# Patient Record
Sex: Male | Born: 1980 | Race: White | Hispanic: No | Marital: Single | Smoking: Current every day smoker
Health system: Southern US, Community
[De-identification: ages and names within clinical notes are randomized; demographics above are authoritative.]

## PROBLEM LIST (undated history)

## (undated) DIAGNOSIS — Z8614 Personal history of Methicillin resistant Staphylococcus aureus infection: Secondary | ICD-10-CM

## (undated) DIAGNOSIS — F419 Anxiety disorder, unspecified: Secondary | ICD-10-CM

## (undated) DIAGNOSIS — E785 Hyperlipidemia, unspecified: Secondary | ICD-10-CM

## (undated) DIAGNOSIS — R7303 Prediabetes: Secondary | ICD-10-CM

## (undated) DIAGNOSIS — F319 Bipolar disorder, unspecified: Secondary | ICD-10-CM

## (undated) DIAGNOSIS — M79642 Pain in left hand: Secondary | ICD-10-CM

## (undated) DIAGNOSIS — M869 Osteomyelitis, unspecified: Secondary | ICD-10-CM

## (undated) DIAGNOSIS — F32A Depression, unspecified: Secondary | ICD-10-CM

## (undated) DIAGNOSIS — Z87898 Personal history of other specified conditions: Secondary | ICD-10-CM

## (undated) DIAGNOSIS — M1990 Unspecified osteoarthritis, unspecified site: Secondary | ICD-10-CM

## (undated) DIAGNOSIS — Z973 Presence of spectacles and contact lenses: Secondary | ICD-10-CM

## (undated) DIAGNOSIS — F329 Major depressive disorder, single episode, unspecified: Secondary | ICD-10-CM

## (undated) HISTORY — DX: Hyperlipidemia, unspecified: E78.5

## (undated) HISTORY — DX: Major depressive disorder, single episode, unspecified: F32.9

## (undated) HISTORY — DX: Anxiety disorder, unspecified: F41.9

## (undated) HISTORY — DX: Depression, unspecified: F32.A

## (undated) HISTORY — DX: Bipolar disorder, unspecified: F31.9

## (undated) HISTORY — DX: Unspecified osteoarthritis, unspecified site: M19.90

---

## 2002-07-23 HISTORY — PX: LUMBAR DISC SURGERY: SHX700

## 2013-07-23 HISTORY — PX: GALLBLADDER SURGERY: SHX652

## 2013-11-06 ENCOUNTER — Emergency Department (HOSPITAL_COMMUNITY)
Admission: EM | Admit: 2013-11-06 | Discharge: 2013-11-06 | Disposition: A | Discharge status: Home / Self Care | Payer: Medicare Other | Attending: Emergency Medicine | Admitting: Emergency Medicine

## 2013-11-06 ENCOUNTER — Emergency Department (HOSPITAL_COMMUNITY): Payer: Medicare Other

## 2013-11-06 ENCOUNTER — Encounter (HOSPITAL_COMMUNITY): Payer: Self-pay | Admitting: Emergency Medicine

## 2013-11-06 DIAGNOSIS — S20229A Contusion of unspecified back wall of thorax, initial encounter: Secondary | ICD-10-CM | POA: Insufficient documentation

## 2013-11-06 DIAGNOSIS — Z9889 Other specified postprocedural states: Secondary | ICD-10-CM | POA: Insufficient documentation

## 2013-11-06 DIAGNOSIS — F17200 Nicotine dependence, unspecified, uncomplicated: Secondary | ICD-10-CM | POA: Diagnosis not present

## 2013-11-06 DIAGNOSIS — S301XXA Contusion of abdominal wall, initial encounter: Secondary | ICD-10-CM | POA: Diagnosis not present

## 2013-11-06 DIAGNOSIS — W138XXA Fall from, out of or through other building or structure, initial encounter: Secondary | ICD-10-CM | POA: Insufficient documentation

## 2013-11-06 DIAGNOSIS — IMO0002 Reserved for concepts with insufficient information to code with codable children: Secondary | ICD-10-CM | POA: Insufficient documentation

## 2013-11-06 DIAGNOSIS — Y9289 Other specified places as the place of occurrence of the external cause: Secondary | ICD-10-CM | POA: Diagnosis not present

## 2013-11-06 DIAGNOSIS — T148XXA Other injury of unspecified body region, initial encounter: Secondary | ICD-10-CM

## 2013-11-06 DIAGNOSIS — Y939 Activity, unspecified: Secondary | ICD-10-CM | POA: Diagnosis not present

## 2013-11-06 MED ORDER — HYDROCODONE-ACETAMINOPHEN 5-325 MG PO TABS
2.0000 | ORAL_TABLET | Freq: Once | ORAL | Status: AC
  Administered 2013-11-06: 2 via ORAL
  Filled 2013-11-06: qty 2

## 2013-11-06 MED ORDER — DIAZEPAM 5 MG PO TABS
10.0000 mg | ORAL_TABLET | Freq: Once | ORAL | Status: AC
  Administered 2013-11-06: 10 mg via ORAL
  Filled 2013-11-06: qty 2

## 2013-11-06 MED ORDER — HYDROCODONE-ACETAMINOPHEN 5-325 MG PO TABS
ORAL_TABLET | ORAL | Status: DC
Start: 2013-11-06 — End: 2013-12-02

## 2013-11-06 MED ORDER — METHOCARBAMOL 500 MG PO TABS: 500.0000 mg | ORAL_TABLET | Freq: Three times a day (TID) | ORAL | Status: DC

## 2013-11-06 NOTE — ED Notes (Signed)
[address] off a roof 2 days ago about 30 ft. Pain in right side, hurts to move, hurst to breath

## 2013-11-06 NOTE — Discharge Instructions (Signed)
Your x-rays are negative fractures or dislocations. Your lungs are fully expanded and no evidence of acute trauma. Please call Dr Sherrie Sport for additional evaluation and management of your discomfort. Please use Robaxin 3 times daily for spasm, may use Norco for pain. This medication may cause drowsiness, please use with caution. Muscle Strain A muscle strain is an injury that occurs when a muscle is stretched beyond its normal length. Usually a small number of muscle fibers are torn when this happens. Muscle strain is rated in degrees. First-degree strains have the least amount of muscle fiber tearing and pain. Second-degree and third-degree strains have increasingly more tearing and pain.  Usually, recovery from muscle strain takes 1 2 weeks. Complete healing takes 5 6 weeks.  CAUSES  Muscle strain happens when a sudden, violent force placed on a muscle stretches it too far. This may occur with lifting, sports, or a fall.  RISK FACTORS Muscle strain is especially common in athletes.  SIGNS AND SYMPTOMS At the site of the muscle strain, there may be:  Pain.  Bruising.  Swelling.  Difficulty using the muscle due to pain or lack of normal function. DIAGNOSIS  Your health care provider will perform a physical exam and ask about your medical history. TREATMENT  Often, the best treatment for a muscle strain is resting, icing, and applying cold compresses to the injured area.  HOME CARE INSTRUCTIONS   Use the PRICE method of treatment to promote muscle healing during the first 2 3 days after your injury. The PRICE method involves:  Protecting the muscle from being injured again.  Restricting your activity and resting the injured body part.  Icing your injury. To do this, put ice in a plastic bag. Place a towel between your skin and the bag. Then, apply the ice and leave it on from 15 20 minutes each hour. After the third day, switch to moist heat packs.  Apply compression to the injured  area with a splint or elastic bandage. Be careful not to wrap it too tightly. This may interfere with blood circulation or increase swelling.  Elevate the injured body part above the level of your heart as often as you can.  Only take over-the-counter or prescription medicines for pain, discomfort, or fever as directed by your health care provider.  Warming up prior to exercise helps to prevent future muscle strains. SEEK MEDICAL CARE IF:   You have increasing pain or swelling in the injured area.  You have numbness, tingling, or a significant loss of strength in the injured area. MAKE SURE YOU:   Understand these instructions.  Will watch your condition.  Will get help right away if you are not doing well or get worse. Document Released: 07/09/2005 Document Revised: 04/29/2013 Document Reviewed: 02/05/2013 [facility] Patient Information 2014 [place], [place].  Contusion A contusion is a deep bruise. Contusions happen when an injury causes bleeding under the skin. Signs of bruising include pain, puffiness (swelling), and discolored skin. The contusion may turn blue, purple, or yellow. HOME CARE   Put ice on the injured area.  Put ice in a plastic bag.  Place a towel between your skin and the bag.  Leave the ice on for 15-20 minutes, 03-04 times a day.  Only take medicine as told by your doctor.  Rest the injured area.  If possible, raise (elevate) the injured area to lessen puffiness. GET HELP RIGHT AWAY IF:   You have more bruising or puffiness.  You have pain that is  getting worse.  Your puffiness or pain is not helped by medicine. MAKE SURE YOU:   Understand these instructions.  Will watch your condition.  Will get help right away if you are not doing well or get worse. Document Released: 12/26/2007 Document Revised: 10/01/2011 Document Reviewed: 05/14/2011 [facility] Patient Information 2014 [place], [place].

## 2013-11-06 NOTE — ED Provider Notes (Signed)
CSN: [id number]     Arrival date & time 11/06/13  6578 History   First MD Initiated Contact with Patient 11/06/13 1003     Chief Complaint  Patient presents with  . Fall     (Consider location/radiation/quality/duration/timing/severity/associated sxs/prior Treatment) HPI Comments: Patient is a 33 year old male who presents to the emergency department with complaint of right side pain following a fall. The patient states that on April 15 he fell nearly 30 feet off of the roof of a house. The patient did not seek medical attention at that time, but now he has pain in his right side. He states that it hurts with certain movement, it hurts with coughing or deep breathing. He is not coughing up any blood. He's not passing any blood in his urine. He denies any blood in stool. He's not had any previous injury or operations or procedures involving his ribs or back. The patient denies being on any anticoagulation medications. He presents now for evaluation of these problems.  Patient is a 33 y.o. male presenting with fall. The history is provided by the patient.  Fall This is a new problem. Pertinent negatives include no abdominal pain, arthralgias, chest pain, coughing or neck pain.    History reviewed. No pertinent past medical history. Past Surgical History  Procedure Laterality Date  . Back surgery     No family history on file. History  Substance Use Topics  . Smoking status: Current Every Day Smoker -- 1.00 packs/day  . Smokeless tobacco: Not on file  . Alcohol Use: Yes     Comment: social    Review of Systems  Constitutional: Negative for activity change.       All ROS Neg except as noted in HPI  HENT: Negative for nosebleeds.   Eyes: Negative for photophobia and discharge.  Respiratory: Negative for cough, shortness of breath and wheezing.   Cardiovascular: Negative for chest pain and palpitations.  Gastrointestinal: Negative for abdominal pain and blood in stool.   Genitourinary: Positive for flank pain. Negative for dysuria, frequency and hematuria.  Musculoskeletal: Positive for back pain. Negative for arthralgias and neck pain.  Skin: Negative.   Neurological: Negative for dizziness, seizures and speech difficulty.  Psychiatric/Behavioral: Negative for hallucinations and confusion.      Allergies  Toradol  Home Medications   Prior to Admission medications   Medication Sig Start Date End Date Taking? Authorizing Provider  acetaminophen (TYLENOL) 500 MG tablet Take 1,000 mg by mouth every 6 (six) hours as needed for moderate pain.   Yes Historical Provider, MD  ibuprofen (ADVIL,MOTRIN) 600 MG tablet Take 600 mg by mouth every 8 (eight) hours as needed for moderate pain.   Yes Historical Provider, MD   BP 120/76  Pulse 65  Temp(Src) 98.3 F (36.8 C) (Oral)  Resp 20  SpO2 99% Physical Exam  Nursing note and vitals reviewed. Constitutional: He is oriented to person, place, and time. He appears well-developed and well-nourished.  Non-toxic appearance.  HENT:  Head: Normocephalic and atraumatic.  Right Ear: Tympanic membrane and external ear normal.  Left Ear: Tympanic membrane and external ear normal.  Eyes: EOM and lids are normal. Pupils are equal, round, and reactive to light.  Neck: Normal range of motion. Neck supple. Carotid bruit is not present.  Cardiovascular: Normal rate, regular rhythm, normal heart sounds, intact distal pulses and normal pulses.   Pulmonary/Chest: Breath sounds normal. No respiratory distress.  There is pain to the right flank and posterior rib area  to palpation, with movement, and with deep breathing. There is no noted bruising. There is no palpable deformity. No crepitus.  There is symmetrical rise and fall of the chest. The patient speaks in complete sentences.  Abdominal: Soft. Bowel sounds are normal. There is no tenderness. There is no guarding.  Musculoskeletal: Normal range of motion.  There is  soreness of the right upper trapezius area extending into the right shoulder.  There is right paraspinal area tenderness of the cervical spine area. There is lumbar spine tenderness and paraspinal area tenderness and of the lumbar region. There is no pain to movement of the pelvis. There is good range of motion of both hips, knees, ankles, toes.  Lymphadenopathy:       Head (right side): No submandibular adenopathy present.       Head (left side): No submandibular adenopathy present.    He has no cervical adenopathy.  Neurological: He is alert and oriented to person, place, and time. He has normal strength. No cranial nerve deficit or sensory deficit.  Skin: Skin is warm and dry.  Psychiatric: He has a normal mood and affect. His speech is normal.    ED Course  Procedures (including critical care time) Labs Review Labs Reviewed - No data to display  Imaging Review No results found.   EKG Interpretation None      MDM Patient sustained a fall from nearly 30 feet 2 days ago. He was fine at the time of the injury, but states now he has pain with movement, cough, deep breathing, and certain movements. 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  X-ray of the right ribs is negative for fracture or dislocation. X-ray of the chest is negative for any acute event or problem.  The pulse oximetry is 99% on room air. Within normal limits by my interpretation.   The plan at this time is for the patient to be treated with Norco and Robaxin. The patient is also asked to use 600 mg of ibuprofen every 6 hours. Patient is to return to the emergency department if any changes, problems, or concerns.    Final diagnoses:  None    **I have reviewed nursing notes, vital signs, and all appropriate lab and imaging results for this patient.Lenox Ahr, PA-C 11/07/13 [phone]

## 2013-11-08 NOTE — ED Provider Notes (Signed)
Medical screening examination/treatment/procedure(s) were performed by non-physician practitioner and as supervising physician I was immediately available for consultation/collaboration.   EKG Interpretation None        Alfonzo Feller, DO 11/08/13 [phone]

## 2013-12-02 ENCOUNTER — Emergency Department (HOSPITAL_COMMUNITY)
Admission: EM | Admit: 2013-12-02 | Discharge: 2013-12-02 | Disposition: A | Discharge status: Home / Self Care | Payer: Medicare Other | Attending: Emergency Medicine | Admitting: Emergency Medicine

## 2013-12-02 ENCOUNTER — Emergency Department (HOSPITAL_COMMUNITY): Payer: Medicare Other

## 2013-12-02 ENCOUNTER — Encounter (HOSPITAL_COMMUNITY): Payer: Self-pay | Admitting: Emergency Medicine

## 2013-12-02 DIAGNOSIS — Z79899 Other long term (current) drug therapy: Secondary | ICD-10-CM | POA: Insufficient documentation

## 2013-12-02 DIAGNOSIS — R42 Dizziness and giddiness: Secondary | ICD-10-CM | POA: Insufficient documentation

## 2013-12-02 DIAGNOSIS — K819 Cholecystitis, unspecified: Secondary | ICD-10-CM | POA: Insufficient documentation

## 2013-12-02 DIAGNOSIS — F17200 Nicotine dependence, unspecified, uncomplicated: Secondary | ICD-10-CM | POA: Insufficient documentation

## 2013-12-02 LAB — CBC WITH DIFFERENTIAL/PLATELET
Basophils Absolute: 0 10*3/uL (ref 0.0–0.1)
Basophils Relative: 1 % (ref 0–1)
Eosinophils Absolute: 0.1 10*3/uL (ref 0.0–0.7)
Eosinophils Relative: 1 % (ref 0–5)
HCT: 39.8 % (ref 39.0–52.0)
HEMOGLOBIN: 13.9 g/dL (ref 13.0–17.0)
LYMPHS ABS: 2.4 10*3/uL (ref 0.7–4.0)
LYMPHS PCT: 33 % (ref 12–46)
MCH: 31.8 pg (ref 26.0–34.0)
MCHC: 34.9 g/dL (ref 30.0–36.0)
MCV: 91.1 fL (ref 78.0–100.0)
MONOS PCT: 9 % (ref 3–12)
Monocytes Absolute: 0.7 10*3/uL (ref 0.1–1.0)
NEUTROS ABS: 4.1 10*3/uL (ref 1.7–7.7)
NEUTROS PCT: 56 % (ref 43–77)
Platelets: 243 10*3/uL (ref 150–400)
RBC: 4.37 MIL/uL (ref 4.22–5.81)
RDW: 14 % (ref 11.5–15.5)
WBC: 7.3 10*3/uL (ref 4.0–10.5)

## 2013-12-02 LAB — URINALYSIS, ROUTINE W REFLEX MICROSCOPIC
BILIRUBIN URINE: NEGATIVE
Glucose, UA: NEGATIVE mg/dL
Hgb urine dipstick: NEGATIVE
Ketones, ur: NEGATIVE mg/dL
LEUKOCYTES UA: NEGATIVE
Nitrite: NEGATIVE
Protein, ur: NEGATIVE mg/dL
SPECIFIC GRAVITY, URINE: 1.02 (ref 1.005–1.030)
Urobilinogen, UA: 0.2 mg/dL (ref 0.0–1.0)
pH: 5.5 (ref 5.0–8.0)

## 2013-12-02 LAB — COMPREHENSIVE METABOLIC PANEL
ALK PHOS: 54 U/L (ref 39–117)
ALT: 17 U/L (ref 0–53)
AST: 29 U/L (ref 0–37)
Albumin: 3.9 g/dL (ref 3.5–5.2)
BILIRUBIN TOTAL: 1.4 mg/dL — AB (ref 0.3–1.2)
BUN: 13 mg/dL (ref 6–23)
CHLORIDE: 102 meq/L (ref 96–112)
CO2: 25 mEq/L (ref 19–32)
Calcium: 9.3 mg/dL (ref 8.4–10.5)
Creatinine, Ser: 0.94 mg/dL (ref 0.50–1.35)
GFR calc non Af Amer: >90 mL/min (ref >90)
GLUCOSE: 103 mg/dL — AB (ref 70–99)
POTASSIUM: 3.7 meq/L (ref 3.7–5.3)
Sodium: 139 mEq/L (ref 137–147)
Total Protein: 7.4 g/dL (ref 6.0–8.3)

## 2013-12-02 LAB — POC OCCULT BLOOD, ED: Fecal Occult Bld: NEGATIVE

## 2013-12-02 LAB — LIPASE, BLOOD: LIPASE: 71 U/L — AB (ref 11–59)

## 2013-12-02 MED ORDER — ONDANSETRON HCL 4 MG/2ML IJ SOLN
4.0000 mg | Freq: Once | INTRAMUSCULAR | Status: AC
  Administered 2013-12-02: 4 mg via INTRAVENOUS
  Filled 2013-12-02: qty 2

## 2013-12-02 MED ORDER — MORPHINE SULFATE 4 MG/ML IJ SOLN
4.0000 mg | Freq: Once | INTRAMUSCULAR | Status: AC
  Administered 2013-12-02: 4 mg via INTRAVENOUS
  Filled 2013-12-02: qty 1

## 2013-12-02 MED ORDER — SODIUM CHLORIDE 0.9 % IV BOLUS (SEPSIS)
1000.0000 mL | Freq: Once | INTRAVENOUS | Status: AC
  Administered 2013-12-02: 1000 mL via INTRAVENOUS

## 2013-12-02 MED ORDER — HYDROCODONE-ACETAMINOPHEN 5-325 MG PO TABS: 2.0000 | ORAL_TABLET | ORAL | Status: DC | PRN

## 2013-12-02 MED ORDER — ONDANSETRON HCL 4 MG PO TABS: 4.0000 mg | ORAL_TABLET | Freq: Four times a day (QID) | ORAL | Status: DC

## 2013-12-02 NOTE — ED Notes (Signed)
MD at bedside. 

## 2013-12-02 NOTE — ED Notes (Signed)
Pt states he vomited twice last night after work and once today after lunch. Pt states body aches. 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

## 2013-12-02 NOTE — Discharge Instructions (Signed)
Cholecystitis Follow up with Dr. Arnoldo Morale tomorrow at 65 AM. Return to the ED if you develop new or worsening symptoms. Cholecystitis is an inflammation of your gallbladder. It is usually caused by a buildup of gallstones or sludge (cholelithiasis) in your gallbladder. The gallbladder stores a fluid that helps digest fats (bile). Cholecystitis is serious and needs treatment right away.  CAUSES   Gallstones. Gallstones can block the tube that leads to your gallbladder, causing bile to build up. As bile builds up, the gallbladder becomes inflamed.  Bile duct problems, such as blockage from scarring or kinking.  Tumors. Tumors can stop bile from leaving your gallbladder correctly, causing bile to build up. As bile builds up, the gallbladder becomes inflamed. SYMPTOMS   Nausea.  Vomiting.  Abdominal pain, especially in the upper right area of your abdomen.  Abdominal tenderness or bloating.  Sweating.  Chills.  Fever.  Yellowing of the skin and the whites of the eyes (jaundice). DIAGNOSIS  Your caregiver may order blood tests to look for infection or gallbladder problems. Your caregiver may also order imaging tests, such as an ultrasound or computed tomography (CT) scan. Further tests may include a hepatobiliary iminodiacetic acid (HIDA) scan. This scan allows your caregiver to see your bile move from the liver to the gallbladder and to the small intestine. TREATMENT  A hospital stay is usually necessary to lessen the inflammation of your gallbladder. You may be required to not eat or drink (fast) for a certain amount of time. You may be given medicine to treat pain or an antibiotic medicine to treat an infection. Surgery may be needed to remove your gallbladder (cholecystectomy) once the inflammation has gone down. Surgery may be needed right away if you develop complications such as death of gallbladder tissue (gangrene) or a tear (perforation) of the gallbladder.  [place] care will depend on your treatment. In general:  If you were given antibiotics, take them as directed. Finish them even if you start to feel better.  Only take over-the-counter or prescription medicines for pain, discomfort, or fever as directed by your caregiver.  Follow a low-fat diet until you see your caregiver again.  Keep all follow-up visits as directed by your caregiver. SEEK IMMEDIATE MEDICAL CARE IF:   Your pain is increasing and not controlled by medicines.  Your pain moves to another part of your abdomen or to your back.  You have a fever.  You have nausea and vomiting. MAKE SURE YOU:  Understand these instructions.  Will watch your condition.  Will get help right away if you are not doing well or get worse. Document Released: 07/09/2005 Document Revised: 10/01/2011 Document Reviewed: 05/25/2011 [facility] Patient Information 2014 [place], [place].

## 2013-12-02 NOTE — ED Provider Notes (Signed)
CSN: [id number]     Arrival date & time 12/02/13  1240 History  This chart was scribed for Benjamin Essex, MD by Ladene Artist, ED Scribe. The patient was seen in room APA14/APA14. Patient's care was started at 1:07 PM.    Chief Complaint  Patient presents with  . Emesis    The history is provided by the patient. No language interpreter was used.   HPI Comments: Benjamin Carter is a 33 y.o. male who presents to the Emergency Department complaining of multiple episodes of emesis onset yesterday. Pt describes his vomit as "dark particles of coffee beans" and "black chunks". He reports 3 episodes since last night with his last episode being approximately 20 minutes ago.  He has some soreness in his upper abdomen. He reports associated body aches and dizziness. He denies diarrhea, fever, hematemesis, chest pain, back pain, dysuria, hematuria, light-headedness. Pt denies h/o abdominal surgeries. No recent travel to endemic areas. No sick contacts.   History reviewed. No pertinent past medical history. Past Surgical History  Procedure Laterality Date  . Back surgery     No family history on file. History  Substance Use Topics  . Smoking status: Current Every Day Smoker -- 1.00 packs/day    Types: Cigarettes  . Smokeless tobacco: Not on file  . Alcohol Use: Yes     Comment: social    Review of Systems  Constitutional: Negative for fever.  Cardiovascular: Negative for chest pain.  Gastrointestinal: Positive for vomiting. Negative for diarrhea.  Genitourinary: Negative for dysuria and hematuria.  Musculoskeletal: Positive for myalgias. Negative for back pain.  Neurological: Positive for dizziness. Negative for light-headedness.  All other systems reviewed and are negative.   Allergies  Toradol  Home Medications   Prior to Admission medications   Medication Sig Start Date End Date Taking? Authorizing Provider  acetaminophen (TYLENOL) 500 MG tablet Take 1,000 mg by mouth every 6  (six) hours as needed for moderate pain.    Historical Provider, MD  HYDROcodone-acetaminophen [facility]) 5-325 MG per tablet 1 po q4h prn pain 11/06/13   Lenox Ahr, PA-C  ibuprofen (ADVIL,MOTRIN) 600 MG tablet Take 600 mg by mouth every 8 (eight) hours as needed for moderate pain.    Historical Provider, MD  methocarbamol (ROBAXIN) 500 MG tablet Take 1 tablet (500 mg total) by mouth 3 (three) times daily. 11/06/13   Lenox Ahr, PA-C   Triage Vitals: BP 136/67  Pulse 83  Temp(Src) 98.1 F (36.7 C) (Oral)  Resp 16  Ht 6\' 6"  (1.981 m)  Wt 230 lb (104.327 kg)  BMI 26.58 kg/m2  SpO2 100% Physical Exam  Nursing note and vitals reviewed. Constitutional: He is oriented to person, place, and time. He appears well-developed and well-nourished. No distress.  HENT:  Head: Normocephalic and atraumatic.  Mouth/Throat: Oropharynx is clear and moist. No oropharyngeal exudate.  Eyes: EOM are normal. Pupils are equal, round, and reactive to light.  Neck: Normal range of motion. Neck supple. No tracheal deviation present.  Cardiovascular: Normal rate, regular rhythm and normal heart sounds.   No murmur heard. Pulmonary/Chest: Effort normal. No respiratory distress.  Clear lungs   Abdominal: Soft. He exhibits no distension. There is tenderness.  TTP RUQ pain No pain at McBurney's point  Genitourinary:  Brown stool   Musculoskeletal: Normal range of motion. He exhibits no edema and no tenderness.  Neurological: He is alert and oriented to person, place, and time. No cranial nerve deficit. He exhibits normal muscle tone.  Coordination normal.  Skin: Skin is warm and dry.  Psychiatric: He has a normal mood and affect. His behavior is normal.    ED Course  Procedures (including critical care time) DIAGNOSTIC STUDIES: Oxygen Saturation is 100% on RA, normal by my interpretation.    COORDINATION OF CARE: 1:12 PM-Discussed treatment plan with pt at bedside and pt agreed to plan.   Labs  Review Labs Reviewed  COMPREHENSIVE METABOLIC PANEL - Abnormal; Notable for the following:    Glucose, Bld 103 (*)    Total Bilirubin 1.4 (*)    All other components within normal limits  LIPASE, BLOOD - Abnormal; Notable for the following:    Lipase 71 (*)    All other components within normal limits  URINALYSIS, ROUTINE W REFLEX MICROSCOPIC  CBC WITH DIFFERENTIAL  POC OCCULT BLOOD, ED    Imaging Review US Abdomen Limited Ruq  12/02/2013   CLINICAL DATA:  Right upper quadrant pain and vomiting  EXAM: US ABDOMEN LIMITED - RIGHT UPPER QUADRANT  COMPARISON:  None.  FINDINGS: Gallbladder:  There is a 1.5 mm stone within the gallbladder. The gallbladder wall is thickened measuring 4.6 mm in thickness. Positive sonographic Murphy's sign.  Common bile duct:  Diameter: 6 mm.  Liver:  No focal lesion identified. Within normal limits in parenchymal echogenicity.  IMPRESSION: 1. Findings compatible with acute cholecystitis.   Electronically Signed   By: Kerby Moors M.D.   On: 12/02/2013 16:24     EKG Interpretation None      MDM   Final diagnoses:  Cholecystitis  3 episodes of nausea with vomiting since last night with body aches and chills. No fever. No abdominal pain. Patient describes emesis as "clear with black chunks". Denies coffee-ground appearance. Denies bright red blood. Denies any blood in the stool. Denies any excessive anti-inflammatory use.  Abdomen soft without peritoneal signs. Vital stable. Orthostatics negative. Hemoccult Negative. Mild lipase elevation 71. LFTs normal. No evidence of GI bleed.  Cholecystitis discussed with Dr. Arnoldo Morale. He states he will see patient in the office tomorrow. Pain is well-controlled. White blood cell count is normal. LFTs are normal. He did not recommend antibiotics. Patient will be seen in the office at 10:30 AM. Return precautions discussed.   I personally performed the services described in this documentation, which was scribed in my  presence. The recorded information has been reviewed and is accurate.     Benjamin Essex, MD 12/02/13 2141

## 2013-12-03 ENCOUNTER — Encounter (HOSPITAL_COMMUNITY): Payer: Self-pay | Admitting: Pharmacy Technician

## 2013-12-03 NOTE — H&P (Signed)
  NTS SOAP Note  Vital Signs:  Vitals as of: 09/20/6008: Systolic 932: Diastolic 71: Heart Rate 57: Temp 97.2F: Height 72ft 6in: Weight 240Lbs 0 Ounces: Pain Level 9: BMI 27.73  BMI : 27.73 kg/m2  Subjective: This 73 Years 2 Months old Male presents for of abdominal pain.  Seen in ER yesterday.  Found on u/s to have single stone, normal common bile duct.  Lipase slightly elevated, LFT's wnl.  Normal WBC.  Has had intermittent right upper quadrant abdominal pain, nausea, and fatty food intolerance for some time now.  No fever, chills, jaundice.    Review of Symptoms:  Constitutional:  fatigue    headache Eyes:unremarkable   Nose/Mouth/Throat:unremarkable Cardiovascular:  unremarkable   Respiratory:unremarkable   Gastrointestin    abdominal pain,nausea Genitourinary:unremarkable       joint and back Skin:unremarkable Hematolgic/Lymphatic:unremarkable     Allergic/Immunologic:unremarkable     Past Medical History:    Reviewed  Past Medical History  Surgical History: back surgery Medical Problems: none Allergies: toradol Medications: zofran, hydrocodone   Social History:Reviewed  Social History  Preferred Language: English Race:  White Ethnicity: Not Hispanic / Latino Age: 33 Years 2 Months Marital Status:  S Alcohol: socially   Smoking Status: Current every day smoker reviewed on 12/03/2013 Started Date:  Packs per day: 1.00 Functional Status reviewed on 12/03/2013 ------------------------------------------------ Bathing: Normal Cooking: Normal Dressing: Normal Driving: Normal Eating: Normal Managing Meds: Normal Oral Care: Normal Shopping: Normal Toileting: Normal Transferring: Normal Walking: Normal Cognitive Status reviewed on 12/03/2013 ------------------------------------------------ Attention: Normal Decision Making: Normal Language: Normal Memory: Normal Motor: Normal Perception: Normal Problem  Solving: Normal Visual and Spatial: Normal   Family History:  Reviewed  Family Health History Family History is Unknown    Objective Information: General:  Well appearing, well nourished in no distress.   no scleral icterus Heart:  RRR, no murmur or gallop.  Normal S1, S2.  No S3, S4.  Lungs:    CTA bilaterally, no wheezes, rhonchi, rales.  Breathing unlabored. Abdomen:Soft, ND, normal bowel sounds, no HSM, no masses.  No peritoneal signs.  Assessment:cholecystitis, cholelithhiasis  Diagnoses: 574.00 Calculus of gallbladder with acute cholecystitis (Calculus of gallbladder with acute cholecystitis without obstruction)  Procedures: 35573 - OFFICE OUTPATIENT NEW 30 MINUTES    Plan:  Scheduled for laparoscopic cholecystectomy on 12/07/13.   Patient Education:Alternative treatments to surgery were discussed with patient (and family).  Risks and benefits  of procedure including bleeding, infection, hepatobiliary injury, and the possibility of an open procedure were fully explained to the patient (and family) who gave informed consent. Patient/family questions were addressed.  Follow-up:Pending Surgery

## 2013-12-04 ENCOUNTER — Encounter (HOSPITAL_COMMUNITY)
Admission: RE | Admit: 2013-12-04 | Discharge: 2013-12-04 | Disposition: A | Discharge status: Home / Self Care | Payer: Medicare Other | Source: Ambulatory Visit | Attending: General Surgery | Admitting: General Surgery

## 2013-12-04 ENCOUNTER — Encounter (HOSPITAL_COMMUNITY): Payer: Self-pay

## 2013-12-04 NOTE — Progress Notes (Signed)
12/04/13 1303  OBSTRUCTIVE SLEEP APNEA  Have you ever been diagnosed with sleep apnea through a sleep study? No  Do you snore loudly (loud enough to be heard through closed doors)?  1  Do you often feel tired, fatigued, or sleepy during the daytime? 1  Has anyone observed you stop breathing during your sleep? 1  Do you have, or are you being treated for high blood pressure? 0  BMI more than 35 kg/m2? 0  Age over 33 years old? 0  Neck circumference greater than 40 cm/16 inches? 0  Gender: 1  Obstructive Sleep Apnea Score 4

## 2013-12-04 NOTE — Patient Instructions (Signed)
Cuthbert Turton  12/04/2013   Your procedure is scheduled on:  12/07/13  Report to [facility] at 09:50 AM.  Call this number if you have problems the morning of surgery: [phone]   Remember:   Do not eat food or drink liquids after midnight.   Take these medicines the morning of surgery with A SIP OF WATER: Zofran and Hydrocodone or Percocet if needed.   Do not wear jewelry, make-up or nail polish.  Do not wear lotions, powders, or perfumes. You may wear deodorant.  Do not shave 48 hours prior to surgery. Men may shave face and neck.  Do not bring valuables to the hospital.  [facility] is not responsible for any belongings or valuables.               Contacts, dentures or bridgework may not be worn into surgery.  Leave suitcase in the car. After surgery it may be brought to your room.  For patients admitted to the hospital, discharge time is determined by your treatment team.               Patients discharged the day of surgery will not be allowed to drive home.   Special Instructions: Shower using CHG 1 night before surgery and the morning of surgery.  Use special wash - you have one bottle of CHG for both showers.  You should use approximately 1/2 of the bottle for each shower.   Please read over the following fact sheets that you were given: Anesthesia Post-op Instructions and Care and Recovery After Surgery    Laparoscopic Cholecystectomy Laparoscopic cholecystectomy is surgery to remove the gallbladder. The gallbladder is located in the upper right part of the abdomen, behind the liver. It is a storage sac for bile produced in the liver. Bile aids in the digestion and absorption of fats. Cholecystectomy is often done for inflammation of the gallbladder (cholecystitis). This condition is usually caused by a buildup of gallstones (cholelithiasis) in your gallbladder. Gallstones can block the flow of bile, resulting in inflammation and pain. In severe cases, emergency surgery may be  required. When emergency surgery is not required, you will have time to prepare for the procedure. Laparoscopic surgery is an alternative to open surgery. Laparoscopic surgery has a shorter recovery time. Your common bile duct may also need to be examined during the procedure. If stones are found in the common bile duct, they may be removed. LET [facility] CARE PROVIDER KNOW ABOUT:  Any allergies you have.  All medicines you are taking, including vitamins, herbs, eye drops, creams, and over-the-counter medicines.  Previous problems you or members of your family have had with the use of anesthetics.  Any blood disorders you have.  Previous surgeries you have had.  Medical conditions you have. RISKS AND COMPLICATIONS Generally, this is a safe procedure. However, as with any procedure, complications can occur. Possible complications include:  Infection.  Damage to the common bile duct, nerves, arteries, veins, or other internal organs such as the stomach, liver, or intestines.  Bleeding.  A stone may remain in the common bile duct.  A bile leak from the cyst duct that is clipped when your gallbladder is removed.  The need to convert to open surgery, which requires a larger incision in the abdomen. This may be necessary if your surgeon thinks it is not safe to continue with a laparoscopic procedure. BEFORE THE PROCEDURE  Ask your health care provider about changing or stopping any regular medicines.  You will need to stop taking aspirin or blood thinners at least 5 days prior to surgery.  Do not eat or drink anything after midnight the night before surgery.  Let your health care provider know if you develop a cold or other infectious problem before surgery. PROCEDURE   You will be given medicine to make you sleep through the procedure (general anesthetic). A breathing tube will be placed in your mouth.  When you are asleep, your surgeon will make several small cuts (incisions) in  your abdomen.  A thin, lighted tube with a tiny camera on the end (laparoscope) is inserted through one of the small incisions. The camera on the laparoscope sends a picture to a TV screen in the operating room. This gives the surgeon a good view inside your abdomen.  A gas will be pumped into your abdomen. This expands your abdomen so that the surgeon has more room to perform the surgery.  Other tools needed for the procedure are inserted through the other incisions. The gallbladder is removed through one of the incisions.  After the removal of your gallbladder, the incisions will be closed with stitches, staples, or skin glue. AFTER THE PROCEDURE  You will be taken to a recovery area where your progress will be checked often.  You may be allowed to go home the same day if your pain is controlled and you can tolerate liquids. Document Released: 07/09/2005 Document Revised: 04/29/2013 Document Reviewed: 02/18/2013 [facility] Patient Information 2014 [place].    PATIENT INSTRUCTIONS POST-ANESTHESIA  IMMEDIATELY FOLLOWING SURGERY:  Do not drive or operate machinery for the first twenty four hours after surgery.  Do not make any important decisions for twenty four hours after surgery or while taking narcotic pain medications or sedatives.  If you develop intractable nausea and vomiting or a severe headache please notify your doctor immediately.  FOLLOW-UP:  Please make an appointment with your surgeon as instructed. You do not need to follow up with anesthesia unless specifically instructed to do so.  WOUND CARE INSTRUCTIONS (if applicable):  Keep a dry clean dressing on the anesthesia/puncture wound site if there is drainage.  Once the wound has quit draining you may leave it open to air.  Generally you should leave the bandage intact for twenty four hours unless there is drainage.  If the epidural site drains for more than 36-48 hours please call the anesthesia  department.  QUESTIONS?:  Please feel free to call your physician or the hospital operator if you have any questions, and they will be happy to assist you.

## 2013-12-07 ENCOUNTER — Ambulatory Visit (HOSPITAL_COMMUNITY)
Admission: RE | Admit: 2013-12-07 | Discharge: 2013-12-07 | Disposition: A | Discharge status: Home / Self Care | Payer: Medicare Other | Source: Ambulatory Visit | Attending: General Surgery | Admitting: General Surgery

## 2013-12-07 ENCOUNTER — Encounter (HOSPITAL_COMMUNITY): Payer: Self-pay | Admitting: *Deleted

## 2013-12-07 ENCOUNTER — Ambulatory Visit (HOSPITAL_COMMUNITY): Payer: Medicare Other | Admitting: Anesthesiology

## 2013-12-07 ENCOUNTER — Encounter (HOSPITAL_COMMUNITY): Payer: Medicare Other | Admitting: Anesthesiology

## 2013-12-07 ENCOUNTER — Encounter (HOSPITAL_COMMUNITY)
Admission: RE | Disposition: A | Discharge status: Home / Self Care | Payer: Self-pay | Source: Ambulatory Visit | Attending: General Surgery

## 2013-12-07 DIAGNOSIS — G4730 Sleep apnea, unspecified: Secondary | ICD-10-CM | POA: Insufficient documentation

## 2013-12-07 DIAGNOSIS — F17200 Nicotine dependence, unspecified, uncomplicated: Secondary | ICD-10-CM | POA: Insufficient documentation

## 2013-12-07 DIAGNOSIS — K8010 Calculus of gallbladder with chronic cholecystitis without obstruction: Secondary | ICD-10-CM | POA: Insufficient documentation

## 2013-12-07 HISTORY — PX: CHOLECYSTECTOMY: SHX55

## 2013-12-07 SURGERY — LAPAROSCOPIC CHOLECYSTECTOMY
Anesthesia: General | Site: Abdomen

## 2013-12-07 MED ORDER — PROPOFOL 10 MG/ML IV BOLUS
INTRAVENOUS | Status: AC
  Filled 2013-12-07: qty 40

## 2013-12-07 MED ORDER — ONDANSETRON HCL 4 MG/2ML IJ SOLN
4.0000 mg | Freq: Once | INTRAMUSCULAR | Status: AC
  Administered 2013-12-07: 4 mg via INTRAVENOUS

## 2013-12-07 MED ORDER — POVIDONE-IODINE 10 % EX OINT
TOPICAL_OINTMENT | CUTANEOUS | Status: AC
  Filled 2013-12-07: qty 1

## 2013-12-07 MED ORDER — HEMOSTATIC AGENTS (NO CHARGE) OPTIME
TOPICAL | Status: DC | PRN
  Administered 2013-12-07: 1 via TOPICAL

## 2013-12-07 MED ORDER — CIPROFLOXACIN IN D5W 200 MG/100ML IV SOLN
INTRAVENOUS | Status: AC
  Filled 2013-12-07: qty 200

## 2013-12-07 MED ORDER — GLYCOPYRROLATE 0.2 MG/ML IJ SOLN
0.2000 mg | Freq: Once | INTRAMUSCULAR | Status: AC
  Administered 2013-12-07: 0.2 mg via INTRAVENOUS

## 2013-12-07 MED ORDER — LACTATED RINGERS IV SOLN
INTRAVENOUS | Status: DC
  Administered 2013-12-07: 10:00:00 via INTRAVENOUS

## 2013-12-07 MED ORDER — MIDAZOLAM HCL 2 MG/2ML IJ SOLN
1.0000 mg | INTRAMUSCULAR | Status: DC | PRN
  Administered 2013-12-07 (×2): 2 mg via INTRAVENOUS

## 2013-12-07 MED ORDER — NEOSTIGMINE METHYLSULFATE 10 MG/10ML IV SOLN
INTRAVENOUS | Status: DC | PRN
  Administered 2013-12-07: 3 mg via INTRAVENOUS

## 2013-12-07 MED ORDER — POVIDONE-IODINE 10 % OINT PACKET
TOPICAL_OINTMENT | CUTANEOUS | Status: DC | PRN
  Administered 2013-12-07: 2 via TOPICAL

## 2013-12-07 MED ORDER — PROPOFOL 10 MG/ML IV BOLUS
INTRAVENOUS | Status: DC | PRN
  Administered 2013-12-07: 200 mg via INTRAVENOUS
  Administered 2013-12-07: 50 mg via INTRAVENOUS

## 2013-12-07 MED ORDER — FENTANYL CITRATE 0.05 MG/ML IJ SOLN
25.0000 ug | INTRAMUSCULAR | Status: DC | PRN
  Administered 2013-12-07 (×4): 50 ug via INTRAVENOUS
  Filled 2013-12-07: qty 2

## 2013-12-07 MED ORDER — FENTANYL CITRATE 0.05 MG/ML IJ SOLN
INTRAMUSCULAR | Status: DC | PRN
  Administered 2013-12-07 (×4): 50 ug via INTRAVENOUS

## 2013-12-07 MED ORDER — MIDAZOLAM HCL 2 MG/2ML IJ SOLN
INTRAMUSCULAR | Status: AC
  Filled 2013-12-07: qty 2

## 2013-12-07 MED ORDER — SODIUM CHLORIDE 0.9 % IR SOLN
Status: DC | PRN
  Administered 2013-12-07: 500 mL

## 2013-12-07 MED ORDER — GLYCOPYRROLATE 0.2 MG/ML IJ SOLN
INTRAMUSCULAR | Status: AC
  Filled 2013-12-07: qty 1

## 2013-12-07 MED ORDER — MIDAZOLAM HCL 2 MG/2ML IJ SOLN: 1.0000 mg | INTRAMUSCULAR | Status: DC | PRN

## 2013-12-07 MED ORDER — SUCCINYLCHOLINE CHLORIDE 20 MG/ML IJ SOLN
INTRAMUSCULAR | Status: DC | PRN
  Administered 2013-12-07: 120 mg via INTRAVENOUS

## 2013-12-07 MED ORDER — ROCURONIUM BROMIDE 50 MG/5ML IV SOLN
INTRAVENOUS | Status: AC
  Filled 2013-12-07: qty 1

## 2013-12-07 MED ORDER — FENTANYL CITRATE 0.05 MG/ML IJ SOLN
INTRAMUSCULAR | Status: AC
  Filled 2013-12-07: qty 2

## 2013-12-07 MED ORDER — ONDANSETRON HCL 4 MG/2ML IJ SOLN
INTRAMUSCULAR | Status: AC
  Filled 2013-12-07: qty 2

## 2013-12-07 MED ORDER — ONDANSETRON HCL 4 MG/2ML IJ SOLN: 4.0000 mg | Freq: Once | INTRAMUSCULAR | Status: DC | PRN

## 2013-12-07 MED ORDER — LIDOCAINE HCL (PF) 1 % IJ SOLN
INTRAMUSCULAR | Status: AC
  Filled 2013-12-07: qty 5

## 2013-12-07 MED ORDER — FENTANYL CITRATE 0.05 MG/ML IJ SOLN
INTRAMUSCULAR | Status: AC
  Filled 2013-12-07: qty 5

## 2013-12-07 MED ORDER — BUPIVACAINE HCL (PF) 0.5 % IJ SOLN
INTRAMUSCULAR | Status: AC
  Filled 2013-12-07: qty 30

## 2013-12-07 MED ORDER — CHLORHEXIDINE GLUCONATE 4 % EX LIQD: 1.0000 "application " | Freq: Once | CUTANEOUS | Status: DC

## 2013-12-07 MED ORDER — GLYCOPYRROLATE 0.2 MG/ML IJ SOLN
INTRAMUSCULAR | Status: DC | PRN
  Administered 2013-12-07: .6 mg via INTRAVENOUS

## 2013-12-07 MED ORDER — BUPIVACAINE HCL (PF) 0.5 % IJ SOLN
INTRAMUSCULAR | Status: DC | PRN
  Administered 2013-12-07: 10 mL

## 2013-12-07 MED ORDER — SUCCINYLCHOLINE CHLORIDE 20 MG/ML IJ SOLN
INTRAMUSCULAR | Status: AC
  Filled 2013-12-07: qty 1

## 2013-12-07 MED ORDER — CIPROFLOXACIN IN D5W 400 MG/200ML IV SOLN
400.0000 mg | INTRAVENOUS | Status: AC
  Administered 2013-12-07: 400 mg via INTRAVENOUS

## 2013-12-07 MED ORDER — OXYCODONE-ACETAMINOPHEN 7.5-325 MG PO TABS: 1.0000 | ORAL_TABLET | ORAL | Status: DC | PRN

## 2013-12-07 MED ORDER — LIDOCAINE HCL 1 % IJ SOLN
INTRAMUSCULAR | Status: DC | PRN
  Administered 2013-12-07: 40 mg via INTRADERMAL

## 2013-12-07 MED ORDER — KETOROLAC TROMETHAMINE 30 MG/ML IJ SOLN
INTRAMUSCULAR | Status: AC
Start: 2013-12-07 — End: 2013-12-07
  Filled 2013-12-07: qty 1

## 2013-12-07 MED ORDER — ROCURONIUM BROMIDE 100 MG/10ML IV SOLN
INTRAVENOUS | Status: DC | PRN
  Administered 2013-12-07: 25 mg via INTRAVENOUS
  Administered 2013-12-07: 5 mg via INTRAVENOUS

## 2013-12-07 SURGICAL SUPPLY — 39 items
APPLIER CLIP LAPSCP 10X32 DD (CLIP) ×3 IMPLANT
BAG HAMPER (MISCELLANEOUS) ×3 IMPLANT
CLOTH BEACON ORANGE TIMEOUT ST (SAFETY) ×3 IMPLANT
COVER LIGHT HANDLE STERIS (MISCELLANEOUS) ×6 IMPLANT
DECANTER SPIKE VIAL GLASS SM (MISCELLANEOUS) ×3 IMPLANT
DURAPREP 26ML APPLICATOR (WOUND CARE) ×3 IMPLANT
ELECT REM PT RETURN 9FT ADLT (ELECTROSURGICAL) ×3
ELECTRODE REM PT RTRN 9FT ADLT (ELECTROSURGICAL) ×1 IMPLANT
FILTER SMOKE EVAC LAPAROSHD (FILTER) ×3 IMPLANT
FORMALIN 10 PREFIL 120ML (MISCELLANEOUS) ×3 IMPLANT
GLOVE BIOGEL M 6.5 STRL (GLOVE) ×3 IMPLANT
GLOVE ECLIPSE 6.5 STRL STRAW (GLOVE) ×3 IMPLANT
GLOVE INDICATOR 7.0 STRL GRN (GLOVE) ×6 IMPLANT
GLOVE SURG SS PI 7.5 STRL IVOR (GLOVE) ×6 IMPLANT
GOWN STRL REUS W/TWL LRG LVL3 (GOWN DISPOSABLE) ×9 IMPLANT
HEMOSTAT SNOW SURGICEL 2X4 (HEMOSTASIS) ×3 IMPLANT
INST SET LAPROSCOPIC AP (KITS) ×3 IMPLANT
IV NS IRRIG 3000ML ARTHROMATIC (IV SOLUTION) IMPLANT
KIT ROOM TURNOVER APOR (KITS) ×3 IMPLANT
MANIFOLD NEPTUNE II (INSTRUMENTS) ×3 IMPLANT
NEEDLE INSUFFLATION 14GA 120MM (NEEDLE) ×3 IMPLANT
NS IRRIG 1000ML POUR BTL (IV SOLUTION) ×3 IMPLANT
PACK LAP CHOLE LZT030E (CUSTOM PROCEDURE TRAY) ×3 IMPLANT
PAD ARMBOARD 7.5X6 YLW CONV (MISCELLANEOUS) ×3 IMPLANT
POUCH SPECIMEN RETRIEVAL 10MM (ENDOMECHANICALS) ×3 IMPLANT
SET BASIN LINEN APH (SET/KITS/TRAYS/PACK) ×3 IMPLANT
SET TUBE IRRIG SUCTION NO TIP (IRRIGATION / IRRIGATOR) IMPLANT
SLEEVE ENDOPATH XCEL 5M (ENDOMECHANICALS) ×3 IMPLANT
SPONGE GAUZE 2X2 8PLY STER LF (GAUZE/BANDAGES/DRESSINGS) ×4
SPONGE GAUZE 2X2 8PLY STRL LF (GAUZE/BANDAGES/DRESSINGS) ×8 IMPLANT
STAPLER VISISTAT (STAPLE) ×3 IMPLANT
SUT VICRYL 0 UR6 27IN ABS (SUTURE) ×3 IMPLANT
TAPE CLOTH SURG 4X10 WHT LF (GAUZE/BANDAGES/DRESSINGS) ×3 IMPLANT
TROCAR ENDO BLADELESS 11MM (ENDOMECHANICALS) ×3 IMPLANT
TROCAR XCEL NON-BLD 5MMX100MML (ENDOMECHANICALS) ×3 IMPLANT
TROCAR XCEL UNIV SLVE 11M 100M (ENDOMECHANICALS) ×3 IMPLANT
TUBING INSUFFLATION (TUBING) ×3 IMPLANT
WARMER LAPAROSCOPE (MISCELLANEOUS) ×3 IMPLANT
YANKAUER SUCT 12FT TUBE ARGYLE (SUCTIONS) ×3 IMPLANT

## 2013-12-07 NOTE — Anesthesia Postprocedure Evaluation (Addendum)
  Anesthesia Post-op Note  Patient: Benjamin Carter  Procedure(s) Performed: Procedure(s): LAPAROSCOPIC CHOLECYSTECTOMY (N/A)  Patient Location: PACU  Anesthesia Type:MAC  Level of Consciousness: awake, alert  and oriented  Airway and Oxygen Therapy: Patient Spontanous Breathing and Patient connected to face mask oxygen  Post-op Pain: mild  Post-op Assessment: Post-op Vital signs reviewed, Patient's Cardiovascular Status Stable, Respiratory Function Stable, Patent Airway and No signs of Nausea or vomiting  Post-op Vital Signs: Reviewed and stable 36.4  Last Vitals:  Filed Vitals:   12/07/13 1100  BP: 90/54  Temp:   Resp: 12    Complications: No apparent anesthesia complications

## 2013-12-07 NOTE — Anesthesia Preprocedure Evaluation (Signed)
Anesthesia Evaluation  Patient identified by MRN, date of birth, ID band Patient awake    Reviewed: Allergy & Precautions, H&P , NPO status , Patient's Chart, lab work & pertinent test results  Airway Mallampati: I TM Distance: >3 FB     Dental  (+) Missing, Poor Dentition, Dental Advisory Given   Pulmonary sleep apnea , Current Smoker,  breath sounds clear to auscultation        Cardiovascular negative cardio ROS  Rhythm:Regular Rate:Normal     Neuro/Psych    GI/Hepatic negative GI ROS,   Endo/Other    Renal/GU      Musculoskeletal   Abdominal   Peds  Hematology   Anesthesia Other Findings   Reproductive/Obstetrics                           Anesthesia Physical Anesthesia Plan  ASA: II  Anesthesia Plan: General   Post-op Pain Management:    Induction: Intravenous, Rapid sequence and Cricoid pressure planned  Airway Management Planned: Oral ETT  Additional Equipment:   Intra-op Plan:   Post-operative Plan: Extubation in OR  Informed Consent: I have reviewed the patients History and Physical, chart, labs and discussed the procedure including the risks, benefits and alternatives for the proposed anesthesia with the patient or authorized representative who has indicated his/her understanding and acceptance.     Plan Discussed with:   Anesthesia Plan Comments:         Anesthesia Quick Evaluation

## 2013-12-07 NOTE — Interval H&P Note (Signed)
History and Physical Interval Note:  12/07/2013 10:45 AM  Benjamin Carter  has presented today for surgery, with the diagnosis of cholelithiasis  The various methods of treatment have been discussed with the patient and family. After consideration of risks, benefits and other options for treatment, the patient has consented to  Procedure(s): LAPAROSCOPIC CHOLECYSTECTOMY (N/A) as a surgical intervention .  The patient's history has been reviewed, patient examined, no change in status, stable for surgery.  I have reviewed the patient's chart and labs.  Questions were answered to the patient's satisfaction.     Jamesetta So

## 2013-12-07 NOTE — Discharge Instructions (Signed)
Laparoscopic Cholecystectomy, Care After °Refer to this sheet in the next few weeks. These instructions provide you with information on caring for yourself after your procedure. Your health care provider may also give you more specific instructions. Your treatment has been planned according to current medical practices, but problems sometimes occur. Call your health care provider if you have any problems or questions after your procedure. °WHAT TO EXPECT AFTER THE PROCEDURE °After your procedure, it is typical to have the following: °· Pain at your incision sites. You will be given pain medicines to control the pain. °· Mild nausea or vomiting. This should improve after the first 24 hours. °· Bloating and possibly shoulder pain from the gas used during the procedure. This will improve after the first 24 hours. °HOME CARE INSTRUCTIONS  °· Change bandages (dressings) as directed by your health care provider. °· Keep the wound dry and clean. You may wash the wound gently with soap and water. Gently blot or dab the area dry. °· Do not take baths or use swimming pools or hot tubs for 2 weeks or until your health care provider approves. °· Only take over-the-counter or prescription medicines as directed by your health care provider. °· Continue your normal diet as directed by your health care provider. °· Do not lift anything heavier than 10 pounds (4.5 kg) until your health care provider approves. °· Do not play contact sports for 1 week or until your health care provider approves. °SEEK MEDICAL CARE IF:  °· You have redness, swelling, or increasing pain in the wound. °· You notice yellowish-white fluid (pus) coming from the wound. °· You have drainage from the wound that lasts longer than 1 day. °· You notice a bad smell coming from the wound or dressing. °· Your surgical cuts (incisions) break open. °SEEK IMMEDIATE MEDICAL CARE IF:  °· You develop a rash. °· You have difficulty breathing. °· You have chest pain. °· You  have a fever. °· You have increasing pain in the shoulders (shoulder strap areas). °· You have dizzy episodes or faint while standing. °· You have severe abdominal pain. °· You feel sick to your stomach (nauseous) or throw up (vomit) and this lasts for more than 1 day. °Document Released: 07/09/2005 Document Revised: 04/29/2013 Document Reviewed: 02/18/2013 °ExitCare® Patient Information ©2014 [employer]. ° °

## 2013-12-07 NOTE — Op Note (Signed)
Patient:  Benjamin Carter  DOB:  [birth year]  MRN:  [id number]   Preop Diagnosis:  Cholecystitis, cholelithiasis  Postop Diagnosis:  Same  Procedure:  Laparoscopic cholecystectomy  Surgeon:  Aviva Signs, M.D.  Anes:  General endotracheal  Indications:  Patient is a 33 year old white male who presents with cholecystitis secondary to cholelithiasis. The risks and benefits of the procedure including bleeding, infection, hepatobiliary injury, and the possibility of an open procedure were fully explained to the patient, who gave informed consent.  Procedure note:  The patient is placed the supine position. After induction of general endotracheal anesthesia, the abdomen was prepped and draped using usual sterile technique with DuraPrep. Surgical site confirmation was performed.  A supraumbilical incision was made down to the fascia. A Veress needle was introduced into the abdominal cavity and confirmation of placement was done using the saline drop test. The abdomen was then insufflated to 16 mm mercury pressure. An 11 mm trocar was introduced into the abdominal cavity under direct visualization without difficulty. The patient was placed in reverse Trendelenburg position and additional 11 mm trocar was placed the epigastric region and 5 mm trochars were placed the right upper quadrant and right flank regions. Liver was inspected and noted within normal limits. The gallbladder was retracted in a dynamic fashion in order to expose the triangle of Calot. The cystic duct was first identified. Its juncture to the infundibulum was fully identified. Endoclips placed proximally and distally on the cystic duct, and the cystic duct was divided. This is likewise done cystic artery. The gallbladder was then freed away from the gallbladder fossa using Bovie electrocautery. The gallbladder was delivered through the epigastric trocar site using an Endo Catch bag. The gallbladder fossa was inspected and no abnormal  bleeding or bile leakage was noted. Surgicel is placed the gallbladder fossa. All fluid and air were then evacuated from the abdominal cavity prior to removal of the trochars.  All wounds were irrigated with normal saline. All wounds were injected with 0.5% Sensorcaine. The supraumbilical fascia as well as epigastric fascia reapproximated using 0 Vicryl interrupted sutures. All skin incisions were closed using staples. Betadine ointment and dry sterile dressings were applied.  All tape and needle counts were correct at the end of the procedure. Patient was extubated in the operating room and transferred to PACU in stable condition.  Complications:  None  EBL:  Minimal  Specimen:  Gallbladder

## 2013-12-07 NOTE — Anesthesia Procedure Notes (Signed)
Procedure Name: Intubation Date/Time: 12/07/2013 11:14 AM Performed by: Tressie Stalker E Pre-anesthesia Checklist: Patient identified, Patient being monitored, Timeout performed, Emergency Drugs available and Suction available Patient Re-evaluated:Patient Re-evaluated prior to inductionOxygen Delivery Method: Circle System Utilized Preoxygenation: Pre-oxygenation with 100% oxygen Intubation Type: IV induction, Rapid sequence and Cricoid Pressure applied Ventilation: Mask ventilation without difficulty Laryngoscope Size: Mac and 3 Grade View: Grade I Tube type: Oral Tube size: 8.0 mm Number of attempts: 1 Airway Equipment and Method: stylet Placement Confirmation: ETT inserted through vocal cords under direct vision,  positive ETCO2 and breath sounds checked- equal and bilateral Secured at: 23 cm Tube secured with: Tape Dental Injury: Teeth and Oropharynx as per pre-operative assessment

## 2013-12-07 NOTE — Transfer of Care (Signed)
Immediate Anesthesia Transfer of Care Note  Patient: Benjamin Carter  Procedure(s) Performed: Procedure(s): LAPAROSCOPIC CHOLECYSTECTOMY (N/A)  Patient Location: PACU  Anesthesia Type:MAC  Level of Consciousness: awake and alert   Airway & Oxygen Therapy: Patient Spontanous Breathing and Patient connected to face mask oxygen  Post-op Assessment: Report given to PACU RN  Post vital signs: Reviewed and stable  Complications: No apparent anesthesia complications

## 2013-12-08 ENCOUNTER — Encounter (HOSPITAL_COMMUNITY): Payer: Self-pay | Admitting: General Surgery

## 2014-01-28 ENCOUNTER — Emergency Department (HOSPITAL_COMMUNITY): Payer: Medicare Other

## 2014-01-28 ENCOUNTER — Encounter (HOSPITAL_COMMUNITY): Payer: Self-pay | Admitting: Emergency Medicine

## 2014-01-28 ENCOUNTER — Emergency Department (HOSPITAL_COMMUNITY)
Admission: EM | Admit: 2014-01-28 | Discharge: 2014-01-28 | Disposition: A | Discharge status: Home / Self Care | Payer: Medicare Other | Attending: Emergency Medicine | Admitting: Emergency Medicine

## 2014-01-28 DIAGNOSIS — F17200 Nicotine dependence, unspecified, uncomplicated: Secondary | ICD-10-CM | POA: Insufficient documentation

## 2014-01-28 DIAGNOSIS — R55 Syncope and collapse: Secondary | ICD-10-CM | POA: Insufficient documentation

## 2014-01-28 DIAGNOSIS — IMO0002 Reserved for concepts with insufficient information to code with codable children: Secondary | ICD-10-CM | POA: Insufficient documentation

## 2014-01-28 DIAGNOSIS — Y9389 Activity, other specified: Secondary | ICD-10-CM | POA: Insufficient documentation

## 2014-01-28 DIAGNOSIS — Y990 Civilian activity done for income or pay: Secondary | ICD-10-CM | POA: Insufficient documentation

## 2014-01-28 DIAGNOSIS — S40019A Contusion of unspecified shoulder, initial encounter: Secondary | ICD-10-CM | POA: Insufficient documentation

## 2014-01-28 DIAGNOSIS — S40012A Contusion of left shoulder, initial encounter: Secondary | ICD-10-CM

## 2014-01-28 DIAGNOSIS — Z791 Long term (current) use of non-steroidal anti-inflammatories (NSAID): Secondary | ICD-10-CM | POA: Insufficient documentation

## 2014-01-28 DIAGNOSIS — Y9289 Other specified places as the place of occurrence of the external cause: Secondary | ICD-10-CM | POA: Insufficient documentation

## 2014-01-28 LAB — CBC WITH DIFFERENTIAL/PLATELET
BASOS ABS: 0 10*3/uL (ref 0.0–0.1)
BASOS PCT: 0 % (ref 0–1)
Eosinophils Absolute: 0.1 10*3/uL (ref 0.0–0.7)
Eosinophils Relative: 1 % (ref 0–5)
HCT: 43.1 % (ref 39.0–52.0)
Hemoglobin: 15.5 g/dL (ref 13.0–17.0)
Lymphocytes Relative: 30 % (ref 12–46)
Lymphs Abs: 3.1 10*3/uL (ref 0.7–4.0)
MCH: 32.8 pg (ref 26.0–34.0)
MCHC: 36 g/dL (ref 30.0–36.0)
MCV: 91.1 fL (ref 78.0–100.0)
Monocytes Absolute: 1 10*3/uL (ref 0.1–1.0)
Monocytes Relative: 10 % (ref 3–12)
NEUTROS ABS: 6 10*3/uL (ref 1.7–7.7)
Neutrophils Relative %: 59 % (ref 43–77)
Platelets: 233 10*3/uL (ref 150–400)
RBC: 4.73 MIL/uL (ref 4.22–5.81)
RDW: 12.5 % (ref 11.5–15.5)
WBC: 10.2 10*3/uL (ref 4.0–10.5)

## 2014-01-28 LAB — BASIC METABOLIC PANEL
ANION GAP: 12 (ref 5–15)
BUN: 15 mg/dL (ref 6–23)
CO2: 26 mEq/L (ref 19–32)
Calcium: 9.6 mg/dL (ref 8.4–10.5)
Chloride: 100 mEq/L (ref 96–112)
Creatinine, Ser: 1.1 mg/dL (ref 0.50–1.35)
GFR calc Af Amer: >90 mL/min (ref >90)
GFR calc non Af Amer: 87 mL/min — ABNORMAL LOW (ref >90)
Glucose, Bld: 91 mg/dL (ref 70–99)
Potassium: 4.1 mEq/L (ref 3.7–5.3)
Sodium: 138 mEq/L (ref 137–147)

## 2014-01-28 MED ORDER — HYDROCODONE-ACETAMINOPHEN 5-325 MG PO TABS: 1.0000 | ORAL_TABLET | ORAL | Status: DC | PRN

## 2014-01-28 NOTE — ED Notes (Signed)
Pt reports he is having left shoulder pain from a previous injury at his job. Pt also reports earlier today he "passed out" by the water cooler at work but did not need to come in for that. Denies hitting his head during that episode. Here for the pain in his shoulder.

## 2014-01-28 NOTE — Discharge Instructions (Signed)
Contusion °A contusion is a deep bruise. Contusions are the result of an injury that caused bleeding under the skin. The contusion may turn blue, purple, or yellow. Minor injuries will give you a painless contusion, but more severe contusions may stay painful and swollen for a few weeks.  °CAUSES  °A contusion is usually caused by a blow, trauma, or direct force to an area of the body. °SYMPTOMS  °· Swelling and redness of the injured area. °· Bruising of the injured area. °· Tenderness and soreness of the injured area. °· Pain. °DIAGNOSIS  °The diagnosis can be made by taking a history and physical exam. An X-ray, CT scan, or MRI may be needed to determine if there were any associated injuries, such as fractures. °TREATMENT  °Specific treatment will depend on what area of the body was injured. In general, the best treatment for a contusion is resting, icing, elevating, and applying cold compresses to the injured area. Over-the-counter medicines may also be recommended for pain control. Ask your caregiver what the best treatment is for your contusion. °HOME CARE INSTRUCTIONS  °· Put ice on the injured area. °¨ Put ice in a plastic bag. °¨ Place a towel between your skin and the bag. °¨ Leave the ice on for 15-20 minutes, 3-4 times a day, or as directed by your health care provider. °· Only take over-the-counter or prescription medicines for pain, discomfort, or fever as directed by your caregiver. Your caregiver may recommend avoiding anti-inflammatory medicines (aspirin, ibuprofen, and naproxen) for 48 hours because these medicines may increase bruising. °· Rest the injured area. °· If possible, elevate the injured area to reduce swelling. °SEEK IMMEDIATE MEDICAL CARE IF:  °· You have increased bruising or swelling. °· You have pain that is getting worse. °· Your swelling or pain is not relieved with medicines. °MAKE SURE YOU:  °· Understand these instructions. °· Will watch your condition. °· Will get help right  away if you are not doing well or get worse. °Document Released: 04/18/2005 Document Revised: 07/14/2013 Document Reviewed: 05/14/2011 °ExitCare® Patient Information ©2015 [employer]. This information is not intended to replace advice given to you by your health care provider. Make sure you discuss any questions you have with your health care provider. ° °

## 2014-01-30 NOTE — ED Provider Notes (Signed)
CSN: [id number]     Arrival date & time 01/28/14  1406 History   First MD Initiated Contact with Patient 01/28/14 1526     Chief Complaint  Patient presents with  . Shoulder Pain     (Consider location/radiation/quality/duration/timing/severity/associated sxs/prior Treatment) The history is provided by the patient.    Benjamin Carter is a 33 y.o. male with several complaints.  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  He had difficulty sleeping last night secondary to pain.  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  He was found sitting in a chair,  With his head down, but had not fallen from the chair.  He was briefly difficult to arouse.  He denies weakness, dizziness, nausea, vomiting, chest pain, palpitations or sob.  He does not believe he is dehydrated, as he is careful to hydrate while working.  Past medical history is significant for sleep apnea.     Past Medical History  Diagnosis Date  . Sleep apnea     Stop Bang score of 4   Past Surgical History  Procedure Laterality Date  . Back surgery  x2  . Cholecystectomy N/A 12/07/2013    Procedure: LAPAROSCOPIC CHOLECYSTECTOMY;  Surgeon: Jamesetta So, MD;  Location: [facility];  Service: General;  Laterality: N/A;   History reviewed. No pertinent family history. History  Substance Use Topics  . Smoking status: Current Every Day Smoker -- 1.00 packs/day for 20 years    Types: Cigarettes  . Smokeless tobacco: Not on file  . Alcohol Use: Yes     Comment: social    Review of Systems  Constitutional: Negative for fever.  HENT: Negative for congestion and sore throat.    Eyes: Negative.   Respiratory: Negative for chest tightness and shortness of breath.   Cardiovascular: Negative for chest pain and palpitations.  Gastrointestinal: Negative for nausea and abdominal pain.  Genitourinary: Negative.   Musculoskeletal: Positive for arthralgias. Negative for joint swelling and neck pain.  Skin: Negative.  Negative for rash and wound.  Neurological: Negative for dizziness, weakness, light-headedness, numbness and headaches.  Psychiatric/Behavioral: Negative.       Allergies  Toradol  Home Medications   Prior to Admission medications   Medication Sig Start Date End Date Taking? Authorizing Provider  naproxen sodium (ANAPROX) 220 MG tablet Take 440 mg by mouth daily as needed (shoulder pain).   Yes Historical Provider, MD  HYDROcodone-acetaminophen (NORCO/VICODIN) 5-325 MG per tablet Take 1 tablet by mouth every 4 (four) hours as needed. 01/28/14   Evalee Jefferson, PA-C   BP 126/75  Pulse 62  Temp(Src) 98.4 F (36.9 C) (Oral)  Resp 16  Ht 6\' 6"  (1.981 m)  Wt 220 lb (99.791 kg)  BMI 25.43 kg/m2  SpO2 99% Physical Exam  Nursing note and vitals reviewed. Constitutional: He appears well-developed and well-nourished.  HENT:  Head: Normocephalic and atraumatic.  Eyes: Conjunctivae are normal.  Neck: Normal range of motion.  Cardiovascular: Normal rate, regular rhythm, normal heart sounds and intact distal pulses.   Pulmonary/Chest: Effort normal and breath sounds normal. He has no wheezes.  Abdominal: Soft. Bowel sounds are normal. There is no tenderness.  Musculoskeletal: Normal range of motion.       Left shoulder: He exhibits bony tenderness. He exhibits no swelling, no effusion, no deformity, no spasm, normal pulse and normal strength.  Neurological: He is alert.  Skin: Skin is warm and dry.  Psychiatric: He has a normal mood and affect.    ED Course  Procedures (including critical care time) Labs Review Labs Reviewed  BASIC METABOLIC PANEL -  Abnormal; Notable for the following:    GFR calc non Af Amer 87 (*)    All other components within normal limits  CBC WITH DIFFERENTIAL    Imaging Review Dg Shoulder Left  01/28/2014   CLINICAL DATA:  Blow to the left shoulder.  Pain.  EXAM: LEFT SHOULDER - 2+ VIEW  COMPARISON:  None.  FINDINGS: Imaged bones, joints and soft tissues appear normal.  IMPRESSION: Negative exam.   Electronically Signed   By: Inge Rise M.D.   On: 01/28/2014 16:20     EKG Interpretation None       Date: 01/30/2014  Rate: 59  Rhythm: normal sinus rhythm  QRS Axis: normal  Intervals: normal  ST/T Wave abnormalities: normal  Conduction Disutrbances:none  Narrative Interpretation:   Old EKG Reviewed: none available    MDM   Final diagnoses:  Syncope, unspecified syncope type  Shoulder contusion, left, initial encounter    Pt prescribed hydrocodone for shoulder pain.  Advised ice tx.  F/u with pcp prn.    Labs and xrays reviewed prior to dc home.  No evidence of dehydration.  Pt is not orthostatic.  I suspect patient did fall asleep especially given poor sleep the night before, history of sleep apnea, he was sitting at the time of the event and did not fall out of the chair, doubt syncope.      Evalee Jefferson, PA-C 01/30/14 [id number]

## 2014-02-01 NOTE — ED Provider Notes (Signed)
Medical screening examination/treatment/procedure(s) were performed by non-physician practitioner and as supervising physician I was immediately available for consultation/collaboration.  Leota Jacobsen, MD 02/01/14 1055

## 2014-03-03 ENCOUNTER — Emergency Department (HOSPITAL_COMMUNITY)
Admission: EM | Admit: 2014-03-03 | Discharge: 2014-03-03 | Disposition: A | Discharge status: Home / Self Care | Payer: Medicare Other | Attending: Emergency Medicine | Admitting: Emergency Medicine

## 2014-03-03 ENCOUNTER — Encounter (HOSPITAL_COMMUNITY): Payer: Self-pay | Admitting: Emergency Medicine

## 2014-03-03 DIAGNOSIS — L049 Acute lymphadenitis, unspecified: Secondary | ICD-10-CM

## 2014-03-03 DIAGNOSIS — F17200 Nicotine dependence, unspecified, uncomplicated: Secondary | ICD-10-CM | POA: Insufficient documentation

## 2014-03-03 DIAGNOSIS — M7989 Other specified soft tissue disorders: Secondary | ICD-10-CM | POA: Diagnosis present

## 2014-03-03 DIAGNOSIS — L03019 Cellulitis of unspecified finger: Principal | ICD-10-CM

## 2014-03-03 DIAGNOSIS — L02519 Cutaneous abscess of unspecified hand: Secondary | ICD-10-CM | POA: Insufficient documentation

## 2014-03-03 DIAGNOSIS — L089 Local infection of the skin and subcutaneous tissue, unspecified: Secondary | ICD-10-CM

## 2014-03-03 DIAGNOSIS — L03114 Cellulitis of left upper limb: Secondary | ICD-10-CM

## 2014-03-03 LAB — BASIC METABOLIC PANEL
Anion gap: 12 (ref 5–15)
BUN: 13 mg/dL (ref 6–23)
CO2: 25 mEq/L (ref 19–32)
Calcium: 9.5 mg/dL (ref 8.4–10.5)
Chloride: 101 mEq/L (ref 96–112)
Creatinine, Ser: 0.95 mg/dL (ref 0.50–1.35)
GFR calc non Af Amer: >90 mL/min (ref >90)
Glucose, Bld: 96 mg/dL (ref 70–99)
POTASSIUM: 3.8 meq/L (ref 3.7–5.3)
SODIUM: 138 meq/L (ref 137–147)

## 2014-03-03 LAB — CBC WITH DIFFERENTIAL/PLATELET
BASOS PCT: 0 % (ref 0–1)
Basophils Absolute: 0 10*3/uL (ref 0.0–0.1)
Eosinophils Absolute: 0.1 10*3/uL (ref 0.0–0.7)
Eosinophils Relative: 1 % (ref 0–5)
HCT: 43.9 % (ref 39.0–52.0)
Hemoglobin: 15.4 g/dL (ref 13.0–17.0)
Lymphocytes Relative: 15 % (ref 12–46)
Lymphs Abs: 1.9 10*3/uL (ref 0.7–4.0)
MCH: 32.2 pg (ref 26.0–34.0)
MCHC: 35.1 g/dL (ref 30.0–36.0)
MCV: 91.6 fL (ref 78.0–100.0)
Monocytes Absolute: 1.1 10*3/uL — ABNORMAL HIGH (ref 0.1–1.0)
Monocytes Relative: 9 % (ref 3–12)
NEUTROS ABS: 9.5 10*3/uL — AB (ref 1.7–7.7)
NEUTROS PCT: 75 % (ref 43–77)
PLATELETS: 206 10*3/uL (ref 150–400)
RBC: 4.79 MIL/uL (ref 4.22–5.81)
RDW: 12.4 % (ref 11.5–15.5)
WBC: 12.6 10*3/uL — ABNORMAL HIGH (ref 4.0–10.5)

## 2014-03-03 MED ORDER — PIPERACILLIN-TAZOBACTAM 3.375 G IVPB 30 MIN
3.3750 g | Freq: Once | INTRAVENOUS | Status: AC
  Administered 2014-03-03: 3.375 g via INTRAVENOUS
  Filled 2014-03-03: qty 50

## 2014-03-03 MED ORDER — SULFAMETHOXAZOLE-TRIMETHOPRIM 800-160 MG PO TABS: 1.0000 | ORAL_TABLET | Freq: Two times a day (BID) | ORAL | Status: DC

## 2014-03-03 MED ORDER — SODIUM CHLORIDE 0.9 % IV SOLN
INTRAVENOUS | Status: DC
  Administered 2014-03-03: 19:00:00 via INTRAVENOUS

## 2014-03-03 MED ORDER — FENTANYL CITRATE 0.05 MG/ML IJ SOLN
50.0000 ug | Freq: Once | INTRAMUSCULAR | Status: AC
  Administered 2014-03-03: 50 ug via INTRAVENOUS
  Filled 2014-03-03: qty 2

## 2014-03-03 MED ORDER — VANCOMYCIN HCL IN DEXTROSE 1-5 GM/200ML-% IV SOLN
1000.0000 mg | Freq: Once | INTRAVENOUS | Status: AC
  Administered 2014-03-03: 1000 mg via INTRAVENOUS
  Filled 2014-03-03: qty 200

## 2014-03-03 NOTE — ED Provider Notes (Signed)
CSN: [id number]     Arrival date & time 03/03/14  1758 History  This chart was scribed for Benjamin Norrie, MD by Lowella Petties, ED Scribe. The patient was seen in room APA05/APA05. Patient's care was started at 6:50 PM.    Chief Complaint  Patient presents with  . Cellulitis   The history is provided by the patient. No language interpreter was used.  HPI Comments: Benjamin Carter is a 33 y.o. male who presents to the Emergency Department complaining of redness and swelling in his left index finger that streaks up his right forearm that began this afternoon. He started having some redness 2 days ago while at work.  He states that he cut this finger to the bone with a saw blade last year, and it was repaired in [place], [place]. Marland Kitchen He reports baseline numbness and loss of range of motion in that finger since. He reports associated ? fever and chills that only started when he got into the ED. He states that he has had two infections in his finger and has been on antibiotics twice in the past, with the most recent being in February. He states that he was admitted to the hospital to receive IV ABX for this. He denies nausea or vomiting. He reports that he smokes 1PPD and he does not drink. PT is right handed.   PCP Dr Sherrie Sport in [place]  Past Medical History  Diagnosis Date  . Sleep apnea     Stop Bang score of 4   Past Surgical History  Procedure Laterality Date  . Back surgery  x2  . Cholecystectomy N/A 12/07/2013    Procedure: LAPAROSCOPIC CHOLECYSTECTOMY;  Surgeon: Jamesetta So, MD;  Location: [facility];  Service: General;  Laterality: N/A;   No family history on file. History  Substance Use Topics  . Smoking status: Current Every Day Smoker -- 1.00 packs/day for 20 years    Types: Cigarettes  . Smokeless tobacco: Not on file  . Alcohol Use: Yes     Comment: social  employed  Review of Systems A complete 10 system review of systems was obtained and all systems are negative except as noted  in the HPI and PMH.   Allergies  Toradol  Home Medications   Prior to Admission medications   Medication Sig Start Date End Date Taking? Authorizing Provider  sulfamethoxazole-trimethoprim (SEPTRA DS) 800-160 MG per tablet Take 1 tablet by mouth every 12 (twelve) hours. 03/03/14   Benjamin Norrie, MD   Triage Vitals: BP 122/64  Pulse 75  Temp(Src) 99.3 F (37.4 C) (Oral)  Resp 24  Ht 6\' 6"  (1.981 m)  Wt 240 lb (108.863 kg)  BMI 27.74 kg/m2  SpO2 100%  Vital signs normal   Physical Exam  Nursing note and vitals reviewed. Constitutional: He is oriented to person, place, and time. He appears well-developed and well-nourished.  Non-toxic appearance. He does not appear ill. No distress.  HENT:  Head: Normocephalic and atraumatic.  Right Ear: External ear normal.  Left Ear: External ear normal.  Nose: Nose normal. No mucosal edema or rhinorrhea.  Mouth/Throat: Oropharynx is clear and moist and mucous membranes are normal. No dental abscesses or uvula swelling.  Eyes: Conjunctivae and EOM are normal. Pupils are equal, round, and reactive to light.  Neck: Normal range of motion and full passive range of motion without pain. Neck supple.  Cardiovascular: Normal rate, regular rhythm and normal heart sounds.  Exam reveals no gallop and  no friction rub.   No murmur heard. Pulmonary/Chest: Effort normal and breath sounds normal. No respiratory distress. He has no wheezes. He has no rhonchi. He has no rales. He exhibits no tenderness and no crepitus.  Abdominal: Soft. Normal appearance and bowel sounds are normal. He exhibits no distension. There is no tenderness. There is no rebound and no guarding.  Musculoskeletal: Normal range of motion. He exhibits no edema and no tenderness.  Moves all extremities well. Previous nail deformity of his left index and middle finger w/ diffuse redness of the majority of the left index finger with redness and swelling of the dorsum of his left hand with a red  streak going up his forearm almost to his elbow. See photos.   Neurological: He is alert and oriented to person, place, and time. He has normal strength. No cranial nerve deficit.  Skin: Skin is warm, dry and intact. No rash noted. No erythema. No pallor.  Psychiatric: He has a normal mood and affect. His speech is normal and behavior is normal. His mood appears not anxious.            ED Course  Procedures (including critical care time) Medications  0.9 %  sodium chloride infusion ( Intravenous New Bag/Given 03/03/14 1916)  vancomycin (VANCOCIN) IVPB 1000 mg/200 mL premix (1,000 mg Intravenous New Bag/Given 03/03/14 1955)  piperacillin-tazobactam (ZOSYN) IVPB 3.375 g (0 g Intravenous Stopped 03/03/14 1955)  fentaNYL (SUBLIMAZE) injection 50 mcg (50 mcg Intravenous Given 03/03/14 1916)    DIAGNOSTIC STUDIES: Oxygen Saturation is 100% on room air, normal by my interpretation.    COORDINATION OF CARE: 7:02 PM-Discussed treatment plan which includes likely admission to the hospital with pt at bedside and pt agreed to plan.   Pt's wife here now, states patient has an appt tomorrow with his hand specialist in [place], [place]. He does not want to be admitted. States had similar in Feb treated with oral bactrim. I have strongly urged him to stay and get IV antibiotics at least over night, but he does not want to be admitted.   Labs Review Results for orders placed during the hospital encounter of 03/03/14  CULTURE, BLOOD (ROUTINE X 2)      Result Value Ref Range   Specimen Description BLOOD LEFT ARM     Special Requests       Value: BOTTLES DRAWN AEROBIC AND ANAEROBIC AEB 13CC ANA 12CC   Culture PENDING     Report Status PENDING    CULTURE, BLOOD (ROUTINE X 2)      Result Value Ref Range   Specimen Description BLOOD RIGHT ARM     Special Requests       Value: BOTTLES DRAWN AEROBIC AND ANAEROBIC AEB 10CC ANA 12CC   Culture PENDING     Report Status PENDING    CBC WITH DIFFERENTIAL       Result Value Ref Range   WBC 12.6 (*) 4.0 - 10.5 K/uL   RBC 4.79  4.22 - 5.81 MIL/uL   Hemoglobin 15.4  13.0 - 17.0 g/dL   HCT 43.9  39.0 - 52.0 %   MCV 91.6  78.0 - 100.0 fL   MCH 32.2  26.0 - 34.0 pg   MCHC 35.1  30.0 - 36.0 g/dL   RDW 12.4  11.5 - 15.5 %   Platelets 206  150 - 400 K/uL   Neutrophils Relative % 75  43 - 77 %   Neutro Abs 9.5 (*) 1.7 - 7.7  K/uL   Lymphocytes Relative 15  12 - 46 %   Lymphs Abs 1.9  0.7 - 4.0 K/uL   Monocytes Relative 9  3 - 12 %   Monocytes Absolute 1.1 (*) 0.1 - 1.0 K/uL   Eosinophils Relative 1  0 - 5 %   Eosinophils Absolute 0.1  0.0 - 0.7 K/uL   Basophils Relative 0  0 - 1 %   Basophils Absolute 0.0  0.0 - 0.1 K/uL  BASIC METABOLIC PANEL      Result Value Ref Range   Sodium 138  137 - 147 mEq/L   Potassium 3.8  3.7 - 5.3 mEq/L   Chloride 101  96 - 112 mEq/L   CO2 25  19 - 32 mEq/L   Glucose, Bld 96  70 - 99 mg/dL   BUN 13  6 - 23 mg/dL   Creatinine, Ser 0.95  0.50 - 1.35 mg/dL   Calcium 9.5  8.4 - 10.5 mg/dL   GFR calc non Af Amer >90  >90 mL/min   GFR calc Af Amer >90  >90 mL/min   Anion gap 12  5 - 15   Laboratory interpretation all normal except leukocytosis  .  Imaging Review No results found.   EKG Interpretation None      MDM   Final diagnoses:  Infection of index finger  Lymphadenitis, acute  Cellulitis of hand, left   New Prescriptions   SULFAMETHOXAZOLE-TRIMETHOPRIM (SEPTRA DS) 800-160 MG PER TABLET    Take 1 tablet by mouth every 12 (twelve) hours.   Plan discharge  Rolland Porter, MD, FACEP   I personally performed the services described in this documentation, which was scribed in my presence. The recorded information has been reviewed and considered.  Rolland Porter, MD, Abram Sander    Benjamin Norrie, MD 03/03/14 [id number]

## 2014-03-03 NOTE — ED Notes (Signed)
Left hand swollen and red most notable left index finger. Decreased sensation beginning at left lower wrist/upper hand area. Red streaks extending from hand to upper forearm. Hand warm, DP +3.

## 2014-03-03 NOTE — ED Notes (Signed)
Pt has old injury to left index finger, now Red, swollen, red streaks from index finger up forearm,  tenderness, pain, fever

## 2014-03-03 NOTE — Discharge Instructions (Signed)
Elevate your hand. Use warm compresses for comfort. Take the antibiotics until gone. Keep your appointment with your hand specialist tomorrow that you already have. Return to the ED if you get a fever, vomiting, worsening pain, swelling, chills.    Cellulitis Cellulitis is an infection of the skin and the tissue beneath it. The infected area is usually red and tender. Cellulitis occurs most often in the arms and lower legs.  CAUSES  Cellulitis is caused by bacteria that enter the skin through cracks or cuts in the skin. The most common types of bacteria that cause cellulitis are staphylococci and streptococci. SIGNS AND SYMPTOMS   Redness and warmth.  Swelling.  Tenderness or pain.  Fever. DIAGNOSIS  Your health care provider can usually determine what is wrong based on a physical exam. Blood tests may also be done. TREATMENT  Treatment usually involves taking an antibiotic medicine. HOME CARE INSTRUCTIONS   Take your antibiotic medicine as directed by your health care provider. Finish the antibiotic even if you start to feel better.  Keep the infected arm or leg elevated to reduce swelling.  Apply a warm cloth to the affected area up to 4 times per day to relieve pain.  Take medicines only as directed by your health care provider.  Keep all follow-up visits as directed by your health care provider. SEEK MEDICAL CARE IF:   You notice red streaks coming from the infected area.  Your red area gets larger or turns dark in color.  Your bone or joint underneath the infected area becomes painful after the skin has healed.  Your infection returns in the same area or another area.  You notice a swollen bump in the infected area.  You develop new symptoms.  You have a fever. SEEK IMMEDIATE MEDICAL CARE IF:   You feel very sleepy.  You develop vomiting or diarrhea.  You have a general ill feeling (malaise) with muscle aches and pains. MAKE SURE YOU:   Understand these  instructions.  Will watch your condition.  Will get help right away if you are not doing well or get worse. Document Released: 04/18/2005 Document Revised: 11/23/2013 Document Reviewed: 09/24/2011 [facility] Patient Information 2015 [place], [place]. This information is not intended to replace advice given to you by your health care provider. Make sure you discuss any questions you have with your health care provider.

## 2014-03-04 ENCOUNTER — Telehealth (HOSPITAL_BASED_OUTPATIENT_CLINIC_OR_DEPARTMENT_OTHER): Payer: Self-pay | Admitting: Emergency Medicine

## 2014-03-04 NOTE — Telephone Encounter (Signed)
[facility] lab called positive blood culture - gram positive cocci in clusters. Dr Eliane Decree notified. Dr Tomi Bamberger instructed to call patient to assure follow-up with his hand MD and to return to ED if no follow-up or worse.  Patient notified of + blood culture. Patient currently in Dr Sherrie Sport office for follow-up and request that result be faxed to his office. Called Dr Sherrie Sport office and fax # obtained. Blood culture result faxed to Dr Joselyn Arrow office.  Patient also stated that he was worse and stated he would return to [facility] ED if not admitted by Dr Sherrie Sport.

## 2014-03-05 NOTE — Progress Notes (Signed)
ED Antimicrobial Stewardship Positive Culture Follow Up   Benjamin Carter is an 33 y.o. male who presented to [facility] on 03/03/2014 with a chief complaint of  Chief Complaint  Patient presents with  . Cellulitis    Recent Results (from the past 720 hour(s))  CULTURE, BLOOD (ROUTINE X 2)     Status: None   Collection Time    03/03/14  7:15 PM      Result Value Ref Range Status   Specimen Description BLOOD LEFT ARM   Final   Special Requests     Final   Value: BOTTLES DRAWN AEROBIC AND ANAEROBIC AEB=13CC ANA=12CC   Culture  Setup Time     Final   Value: 03/04/2014 20:33     Performed at [facility]   Culture     Final   Value: GRAM POSITIVE COCCI IN CLUSTERS     Note: Gram Stain Report Called to,Read Back By and Verified With: GAMMONS S (FLOW MANAGER) AT 1318 ON 03/04/2014 BY Tyrone Schimke M     Performed at [facility]   Report Status PENDING   Incomplete  CULTURE, BLOOD (ROUTINE X 2)     Status: None   Collection Time    03/03/14  7:18 PM      Result Value Ref Range Status   Specimen Description BLOOD RIGHT ARM   Final   Special Requests     Final   Value: BOTTLES DRAWN AEROBIC AND ANAEROBIC AEB=10CC ANA=12CC   Culture  Setup Time     Final   Value: 03/04/2014 22:46     Performed at [facility]   Culture     Final   Value: GRAM POSITIVE COCCI IN CLUSTERS     Note: Gram Stain Report Called to,Read Back By and Verified With: GAMMONS S. AT 1660 ON 03/04/14 BY BAUGHAM M. Performed at [facility]     Performed at [facility]   Report Status PENDING   Incomplete    [x]  Treated with Bactrim 800 - 160 mg PO BID x 10 days.  Patient is growing gram positive cocci in clusters in 2/2 blood cultures.  Will call the patient and see if he has gotten treatment anywhere. If not, will refer the patient to come back to ED for treatment.  ED Provider: Rachael Fee, PA-C   [name], [name] 03/05/2014, 9:52 AM Infectious Diseases Pharmacist Phone#  [phone] ;

## 2014-03-06 ENCOUNTER — Emergency Department (HOSPITAL_COMMUNITY): Payer: Medicare Other

## 2014-03-06 ENCOUNTER — Encounter (HOSPITAL_COMMUNITY): Payer: Self-pay | Admitting: Emergency Medicine

## 2014-03-06 ENCOUNTER — Inpatient Hospital Stay (HOSPITAL_COMMUNITY)
Admission: EM | Admit: 2014-03-06 | Discharge: 2014-03-07 | Discharge status: Against Medical Advice | Payer: Medicare Other | Source: Home / Self Care | Attending: Internal Medicine | Admitting: Internal Medicine

## 2014-03-06 DIAGNOSIS — B9561 Methicillin susceptible Staphylococcus aureus infection as the cause of diseases classified elsewhere: Secondary | ICD-10-CM

## 2014-03-06 DIAGNOSIS — L02519 Cutaneous abscess of unspecified hand: Secondary | ICD-10-CM | POA: Diagnosis present

## 2014-03-06 DIAGNOSIS — L03012 Cellulitis of left finger: Secondary | ICD-10-CM | POA: Diagnosis present

## 2014-03-06 DIAGNOSIS — M65839 Other synovitis and tenosynovitis, unspecified forearm: Secondary | ICD-10-CM

## 2014-03-06 DIAGNOSIS — M65849 Other synovitis and tenosynovitis, unspecified hand: Secondary | ICD-10-CM

## 2014-03-06 DIAGNOSIS — L089 Local infection of the skin and subcutaneous tissue, unspecified: Secondary | ICD-10-CM | POA: Diagnosis present

## 2014-03-06 DIAGNOSIS — A419 Sepsis, unspecified organism: Principal | ICD-10-CM | POA: Diagnosis present

## 2014-03-06 DIAGNOSIS — K859 Acute pancreatitis without necrosis or infection, unspecified: Secondary | ICD-10-CM

## 2014-03-06 DIAGNOSIS — M869 Osteomyelitis, unspecified: Secondary | ICD-10-CM

## 2014-03-06 DIAGNOSIS — Z8719 Personal history of other diseases of the digestive system: Secondary | ICD-10-CM | POA: Diagnosis present

## 2014-03-06 DIAGNOSIS — F411 Generalized anxiety disorder: Secondary | ICD-10-CM | POA: Diagnosis present

## 2014-03-06 DIAGNOSIS — G589 Mononeuropathy, unspecified: Secondary | ICD-10-CM | POA: Diagnosis present

## 2014-03-06 DIAGNOSIS — S68118A Complete traumatic metacarpophalangeal amputation of other finger, initial encounter: Secondary | ICD-10-CM

## 2014-03-06 DIAGNOSIS — F17200 Nicotine dependence, unspecified, uncomplicated: Secondary | ICD-10-CM

## 2014-03-06 DIAGNOSIS — G4730 Sleep apnea, unspecified: Secondary | ICD-10-CM

## 2014-03-06 DIAGNOSIS — R7881 Bacteremia: Secondary | ICD-10-CM | POA: Diagnosis present

## 2014-03-06 DIAGNOSIS — L03019 Cellulitis of unspecified finger: Secondary | ICD-10-CM

## 2014-03-06 DIAGNOSIS — M79609 Pain in unspecified limb: Secondary | ICD-10-CM | POA: Diagnosis not present

## 2014-03-06 DIAGNOSIS — A4902 Methicillin resistant Staphylococcus aureus infection, unspecified site: Secondary | ICD-10-CM | POA: Diagnosis present

## 2014-03-06 DIAGNOSIS — A4102 Sepsis due to Methicillin resistant Staphylococcus aureus: Secondary | ICD-10-CM

## 2014-03-06 DIAGNOSIS — E871 Hypo-osmolality and hyponatremia: Secondary | ICD-10-CM | POA: Diagnosis present

## 2014-03-06 LAB — CBC WITH DIFFERENTIAL/PLATELET
Basophils Absolute: 0 10*3/uL (ref 0.0–0.1)
Basophils Relative: 0 % (ref 0–1)
Eosinophils Absolute: 0.1 10*3/uL (ref 0.0–0.7)
Eosinophils Relative: 1 % (ref 0–5)
HCT: 36.9 % — ABNORMAL LOW (ref 39.0–52.0)
HEMOGLOBIN: 13.3 g/dL (ref 13.0–17.0)
Lymphocytes Relative: 22 % (ref 12–46)
Lymphs Abs: 2 10*3/uL (ref 0.7–4.0)
MCH: 32.1 pg (ref 26.0–34.0)
MCHC: 36 g/dL (ref 30.0–36.0)
MCV: 89.1 fL (ref 78.0–100.0)
MONOS PCT: 10 % (ref 3–12)
Monocytes Absolute: 0.8 10*3/uL (ref 0.1–1.0)
NEUTROS ABS: 5.9 10*3/uL (ref 1.7–7.7)
NEUTROS PCT: 67 % (ref 43–77)
PLATELETS: 207 10*3/uL (ref 150–400)
RBC: 4.14 MIL/uL — ABNORMAL LOW (ref 4.22–5.81)
RDW: 12.4 % (ref 11.5–15.5)
WBC: 8.8 10*3/uL (ref 4.0–10.5)

## 2014-03-06 MED ORDER — ONDANSETRON HCL 4 MG/2ML IJ SOLN
4.0000 mg | Freq: Once | INTRAMUSCULAR | Status: AC
  Administered 2014-03-06: 4 mg via INTRAVENOUS
  Filled 2014-03-06: qty 2

## 2014-03-06 MED ORDER — MORPHINE SULFATE 4 MG/ML IJ SOLN
4.0000 mg | Freq: Once | INTRAMUSCULAR | Status: AC
  Administered 2014-03-06: 4 mg via INTRAVENOUS
  Filled 2014-03-06: qty 1

## 2014-03-06 MED ORDER — VANCOMYCIN HCL IN DEXTROSE 1-5 GM/200ML-% IV SOLN
1000.0000 mg | Freq: Once | INTRAVENOUS | Status: AC
  Administered 2014-03-06: 1000 mg via INTRAVENOUS
  Filled 2014-03-06: qty 200

## 2014-03-06 MED ORDER — MORPHINE SULFATE 4 MG/ML IJ SOLN
4.0000 mg | Freq: Once | INTRAMUSCULAR | Status: AC
Start: 2014-03-06 — End: 2014-03-06
  Administered 2014-03-06: 4 mg via INTRAVENOUS
  Filled 2014-03-06: qty 1

## 2014-03-06 NOTE — ED Notes (Signed)
MD at bedside. 

## 2014-03-06 NOTE — ED Provider Notes (Signed)
CSN: [id number]     Arrival date & time 03/06/14  2040 History  This chart was scribed for Dorie Rank, MD by Jeanell Sparrow, ED Scribe. This patient was seen in room APA09/APA09 and the patient's care was started at 9:03 PM.   Chief Complaint  Patient presents with  . Blood Infection   The history is provided by the patient. No language interpreter was used.   HPI Comments: Benjamin Carter is a 33 y.o. male who presents to the Emergency Department complaining of a blood infection on his left hand. Pt was in the ED earlier this week and was evaluated for an infection in his finger.  Dr Rolland Porter recommended admission and further treatment.  Pt did not want to stay and left.  He was discharged on PO abx.  Pt's finger continues to hurt and swell.  Pt reports that [facility] called him this morning saying that he had positive blood cultures and to return to the hospital. He states that he didn't go to the hospital and now he is in severe pain with chills. He reports that he had a injury involving a hand saw a year ago on his left hand. He denies any fever.   Past Medical History  Diagnosis Date  . Sleep apnea     Stop Bang score of 4   Past Surgical History  Procedure Laterality Date  . Back surgery  x2  . Cholecystectomy N/A 12/07/2013    Procedure: LAPAROSCOPIC CHOLECYSTECTOMY;  Surgeon: Jamesetta So, MD;  Location: [facility];  Service: General;  Laterality: N/A;   History reviewed. No pertinent family history. History  Substance Use Topics  . Smoking status: Current Every Day Smoker -- 1.00 packs/day for 20 years    Types: Cigarettes  . Smokeless tobacco: Not on file  . Alcohol Use: Yes     Comment: social    Review of Systems  Constitutional: Positive for chills. Negative for fever.  All other systems reviewed and are negative.   Allergies  Toradol  Home Medications   Prior to Admission medications   Medication Sig Start Date End Date Taking? Authorizing Provider   sulfamethoxazole-trimethoprim (SEPTRA DS) 800-160 MG per tablet Take 1 tablet by mouth every 12 (twelve) hours. 03/03/14  Yes [name], MD   BP 135/86  Pulse 99  Temp(Src) 99.4 F (37.4 C) (Oral)  Resp 20  Ht 6\' 6"  (1.981 m)  Wt 250 lb (113.399 kg)  BMI 28.90 kg/m2  SpO2 100% Physical Exam  Nursing note and vitals reviewed. Constitutional: He appears well-developed and well-nourished. No distress.  HENT:  Head: Normocephalic and atraumatic.  Right Ear: External ear normal.  Left Ear: External ear normal.  Eyes: Conjunctivae are normal. Right eye exhibits no discharge. Left eye exhibits no discharge. No scleral icterus.  Neck: Neck supple. No tracheal deviation present.  Cardiovascular: Normal rate, regular rhythm and intact distal pulses.   Pulmonary/Chest: Effort normal and breath sounds normal. No stridor. No respiratory distress. He has no wheezes. He has no rales.  Abdominal: Soft. Bowel sounds are normal. He exhibits no distension. There is no tenderness. There is no rebound and no guarding.  Musculoskeletal: He exhibits edema and tenderness.       Hands: Neurological: He is alert. He has normal strength. No cranial nerve deficit (no facial droop, extraocular movements intact, no slurred speech) or sensory deficit. He exhibits normal muscle tone. He displays no seizure activity. Coordination normal.  Skin: Skin is warm  and dry. No rash noted.  Psychiatric: He has a normal mood and affect.    ED Course  Procedures (including critical care time) DIAGNOSTIC STUDIES: Oxygen Saturation is 100% on RA, normal by my interpretation.    COORDINATION OF CARE: 9:07 PM- Pt advised of plan for treatment and pt agrees.  Labs Review Labs Reviewed  CBC WITH DIFFERENTIAL - Abnormal; Notable for the following:    RBC 4.14 (*)    HCT 36.9 (*)    All other components within normal limits  LIPASE, BLOOD - Abnormal; Notable for the following:    Lipase 649 (*)    All other components  within normal limits  HEPATIC FUNCTION PANEL - Abnormal; Notable for the following:    AST 67 (*)    ALT 60 (*)    Alkaline Phosphatase 189 (*)    All other components within normal limits  URINALYSIS, ROUTINE W REFLEX MICROSCOPIC - Abnormal; Notable for the following:    Specific Gravity, Urine <1.005 (*)    All other components within normal limits  BASIC METABOLIC PANEL - Abnormal; Notable for the following:    Sodium 132 (*)    Glucose, Bld 163 (*)    GFR calc non Af Amer 86 (*)    All other components within normal limits    Imaging Review Dg Hand Complete Left  03/06/2014   CLINICAL DATA:  Pain, swelling and erythema at a wound between the index and middle fingers of the left hand. Purulent discharge.  EXAM: LEFT HAND - COMPLETE 3+ VIEW  COMPARISON:  None.  FINDINGS: The known soft tissue wound is not well characterized. There is diffuse soft tissue swelling about the second and third metacarpophalangeal joints, and along the second finger. No radiopaque foreign bodies are seen. There is no evidence of osseous erosion at this time.  Visualized joint spaces are preserved. The carpal rows appear grossly intact, and demonstrate normal alignment. 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  IMPRESSION: 1. Known soft tissue wound is not well characterized. No radiopaque foreign bodies seen. Diffuse soft tissue swelling about the second and third metacarpophalangeal joints, and along the second finger. 2. No evidence of osseous erosion at this time. 3. 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   Electronically Signed   By: Garald Balding M.D.   On: 03/06/2014 22:18      MDM   Final diagnoses:  Cellulitis of finger of left hand  Acute pancreatitis, unspecified pancreatitis type  Bacteremia   I reviewed his prior labs.  Blood cultures are positive for  staph aureus.  IV vancomycin started in the ED.  2230  Pt now is stating that he is having severe pain in his left flank.  Will lfts, lipase and ua.  0106  Discussed findings with family.   Pt denies alcohol use.  Prior gallbladder surgery.  Pt has pancreatitis in addition to his finger cellulitis.  Will admit for IV abx.  If he does not improve may end up requiring orthopedic consultation.  I personally performed the services described in this documentation, which was scribed in my presence.  The recorded information has been reviewed and is accurate.     Dorie Rank, MD 03/07/14 [phone]

## 2014-03-06 NOTE — ED Notes (Addendum)
Pt c/o of upper abd pain that comes and goes, pt grimacing while in room due to abd pain, recent gallbladder surgery, EDP made aware

## 2014-03-06 NOTE — ED Notes (Signed)
Positive blood cultures called by [facility] to patient this morning around 9am. Told patient to return to hospital, pt held off and decided celebrate his wifes birthday. Now he is in severe pain, and cold chills, pain to touch.

## 2014-03-06 NOTE — Progress Notes (Signed)
ED Antimicrobial Stewardship Positive Culture Follow Up   Benjamin Carter is an 33 y.o. male who presented to [facility] on 03/03/2014 with a chief complaint of  Chief Complaint  Patient presents with  . Cellulitis    Recent Results (from the past 720 hour(s))  CULTURE, BLOOD (ROUTINE X 2)     Status: None   Collection Time    03/03/14  7:15 PM      Result Value Ref Range Status   Specimen Description BLOOD LEFT ARM   Final   Special Requests     Final   Value: BOTTLES DRAWN AEROBIC AND ANAEROBIC AEB=13CC ANA=12CC   Culture  Setup Time     Final   Value: 03/04/2014 20:33     Performed at [facility]   Culture     Final   Value: STAPHYLOCOCCUS AUREUS     Note: Gram Stain Report Called to,Read Back By and Verified With: GAMMONS S (FLOW MANAGER) AT 1318 ON 03/04/2014 BY Tyrone Schimke M     Performed at [facility]   Report Status PENDING   Incomplete  CULTURE, BLOOD (ROUTINE X 2)     Status: None   Collection Time    03/03/14  7:18 PM      Result Value Ref Range Status   Specimen Description BLOOD RIGHT ARM   Final   Special Requests     Final   Value: BOTTLES DRAWN AEROBIC AND ANAEROBIC AEB=10CC ANA=12CC   Culture  Setup Time     Final   Value: 03/04/2014 22:46     Performed at [facility]   Culture     Final   Value: STAPHYLOCOCCUS AUREUS     Note: Gram Stain Report Called to,Read Back By and Verified With: GAMMONS S. AT 1318 ON 03/04/14 BY BAUGHAM M. Performed at [facility]     Performed at [facility]   Report Status PENDING   Incomplete    Update:  Finally, able to get in touch with him. MD planned to treat him for 2 wks with septra. Sens are not back yet. He also needs to be r/o for any invasive disease. I told him to go back the ED for further evaluation at this point and would likely needs IV abx and imaging today. He agreed to do so on the phone.  Onnie Boer, PharmD Pager: [phone] Infectious Diseases Pharmacist Phone#  [phone]

## 2014-03-07 ENCOUNTER — Encounter (HOSPITAL_COMMUNITY): Discharge status: Against Medical Advice | Payer: Self-pay | Source: Intra-hospital | Attending: Internal Medicine

## 2014-03-07 ENCOUNTER — Inpatient Hospital Stay (HOSPITAL_COMMUNITY)
Admission: EM | Admit: 2014-03-07 | Discharge: 2014-03-11 | DRG: 853 | Discharge status: Against Medical Advice | Payer: Medicare Other | Attending: Internal Medicine | Admitting: Internal Medicine

## 2014-03-07 ENCOUNTER — Encounter (HOSPITAL_COMMUNITY): Payer: Medicare Other | Admitting: Certified Registered"

## 2014-03-07 ENCOUNTER — Encounter (HOSPITAL_COMMUNITY): Payer: Self-pay | Admitting: Emergency Medicine

## 2014-03-07 ENCOUNTER — Inpatient Hospital Stay (HOSPITAL_COMMUNITY): Payer: Medicare Other | Admitting: Certified Registered"

## 2014-03-07 DIAGNOSIS — L03012 Cellulitis of left finger: Secondary | ICD-10-CM

## 2014-03-07 DIAGNOSIS — R7881 Bacteremia: Secondary | ICD-10-CM

## 2014-03-07 DIAGNOSIS — E871 Hypo-osmolality and hyponatremia: Secondary | ICD-10-CM | POA: Diagnosis present

## 2014-03-07 DIAGNOSIS — M869 Osteomyelitis, unspecified: Secondary | ICD-10-CM | POA: Diagnosis present

## 2014-03-07 DIAGNOSIS — L089 Local infection of the skin and subcutaneous tissue, unspecified: Secondary | ICD-10-CM | POA: Diagnosis present

## 2014-03-07 DIAGNOSIS — S68118A Complete traumatic metacarpophalangeal amputation of other finger, initial encounter: Secondary | ICD-10-CM | POA: Diagnosis not present

## 2014-03-07 DIAGNOSIS — K859 Acute pancreatitis without necrosis or infection, unspecified: Secondary | ICD-10-CM | POA: Diagnosis present

## 2014-03-07 DIAGNOSIS — F411 Generalized anxiety disorder: Secondary | ICD-10-CM | POA: Diagnosis present

## 2014-03-07 DIAGNOSIS — A419 Sepsis, unspecified organism: Secondary | ICD-10-CM

## 2014-03-07 DIAGNOSIS — B9561 Methicillin susceptible Staphylococcus aureus infection as the cause of diseases classified elsewhere: Secondary | ICD-10-CM | POA: Diagnosis present

## 2014-03-07 DIAGNOSIS — M65839 Other synovitis and tenosynovitis, unspecified forearm: Secondary | ICD-10-CM | POA: Diagnosis present

## 2014-03-07 DIAGNOSIS — L02519 Cutaneous abscess of unspecified hand: Secondary | ICD-10-CM | POA: Diagnosis present

## 2014-03-07 DIAGNOSIS — A4102 Sepsis due to Methicillin resistant Staphylococcus aureus: Secondary | ICD-10-CM

## 2014-03-07 DIAGNOSIS — G589 Mononeuropathy, unspecified: Secondary | ICD-10-CM

## 2014-03-07 DIAGNOSIS — F419 Anxiety disorder, unspecified: Secondary | ICD-10-CM | POA: Diagnosis present

## 2014-03-07 DIAGNOSIS — G629 Polyneuropathy, unspecified: Secondary | ICD-10-CM

## 2014-03-07 DIAGNOSIS — G4730 Sleep apnea, unspecified: Secondary | ICD-10-CM | POA: Diagnosis present

## 2014-03-07 DIAGNOSIS — M79609 Pain in unspecified limb: Secondary | ICD-10-CM | POA: Diagnosis present

## 2014-03-07 DIAGNOSIS — A4902 Methicillin resistant Staphylococcus aureus infection, unspecified site: Secondary | ICD-10-CM | POA: Diagnosis present

## 2014-03-07 DIAGNOSIS — L03019 Cellulitis of unspecified finger: Secondary | ICD-10-CM

## 2014-03-07 DIAGNOSIS — F17200 Nicotine dependence, unspecified, uncomplicated: Secondary | ICD-10-CM | POA: Diagnosis present

## 2014-03-07 DIAGNOSIS — A4901 Methicillin susceptible Staphylococcus aureus infection, unspecified site: Secondary | ICD-10-CM

## 2014-03-07 DIAGNOSIS — Z8719 Personal history of other diseases of the digestive system: Secondary | ICD-10-CM | POA: Diagnosis present

## 2014-03-07 HISTORY — PX: I & D EXTREMITY: SHX5045

## 2014-03-07 LAB — URINALYSIS, ROUTINE W REFLEX MICROSCOPIC
BILIRUBIN URINE: NEGATIVE
GLUCOSE, UA: NEGATIVE mg/dL
Hgb urine dipstick: NEGATIVE
KETONES UR: NEGATIVE mg/dL
LEUKOCYTES UA: NEGATIVE
NITRITE: NEGATIVE
PH: 6 (ref 5.0–8.0)
Protein, ur: NEGATIVE mg/dL
Specific Gravity, Urine: <1.005 — ABNORMAL LOW (ref 1.005–1.030)
Urobilinogen, UA: 0.2 mg/dL (ref 0.0–1.0)

## 2014-03-07 LAB — CBC
HCT: 35.7 % — ABNORMAL LOW (ref 39.0–52.0)
Hemoglobin: 12.6 g/dL — ABNORMAL LOW (ref 13.0–17.0)
MCH: 31.8 pg (ref 26.0–34.0)
MCHC: 35.3 g/dL (ref 30.0–36.0)
MCV: 90.2 fL (ref 78.0–100.0)
PLATELETS: 176 10*3/uL (ref 150–400)
RBC: 3.96 MIL/uL — ABNORMAL LOW (ref 4.22–5.81)
RDW: 12.4 % (ref 11.5–15.5)
WBC: 6.7 10*3/uL (ref 4.0–10.5)

## 2014-03-07 LAB — BASIC METABOLIC PANEL
ANION GAP: 13 (ref 5–15)
BUN: 12 mg/dL (ref 6–23)
CHLORIDE: 97 meq/L (ref 96–112)
CO2: 22 mEq/L (ref 19–32)
CREATININE: 1.11 mg/dL (ref 0.50–1.35)
Calcium: 8.8 mg/dL (ref 8.4–10.5)
GFR calc non Af Amer: 86 mL/min — ABNORMAL LOW (ref >90)
Glucose, Bld: 163 mg/dL — ABNORMAL HIGH (ref 70–99)
Potassium: 3.7 mEq/L (ref 3.7–5.3)
Sodium: 132 mEq/L — ABNORMAL LOW (ref 137–147)

## 2014-03-07 LAB — CULTURE, BLOOD (ROUTINE X 2)

## 2014-03-07 LAB — COMPREHENSIVE METABOLIC PANEL
ALBUMIN: 3.3 g/dL — AB (ref 3.5–5.2)
ALT: 100 U/L — ABNORMAL HIGH (ref 0–53)
AST: 136 U/L — AB (ref 0–37)
Alkaline Phosphatase: 246 U/L — ABNORMAL HIGH (ref 39–117)
Anion gap: 9 (ref 5–15)
BUN: 9 mg/dL (ref 6–23)
CALCIUM: 8.3 mg/dL — AB (ref 8.4–10.5)
CO2: 25 mEq/L (ref 19–32)
Chloride: 100 mEq/L (ref 96–112)
Creatinine, Ser: 1.06 mg/dL (ref 0.50–1.35)
GFR calc Af Amer: >90 mL/min (ref >90)
Glucose, Bld: 132 mg/dL — ABNORMAL HIGH (ref 70–99)
Potassium: 4.1 mEq/L (ref 3.7–5.3)
SODIUM: 134 meq/L — AB (ref 137–147)
Total Bilirubin: 1.5 mg/dL — ABNORMAL HIGH (ref 0.3–1.2)
Total Protein: 6.9 g/dL (ref 6.0–8.3)

## 2014-03-07 LAB — HEPATIC FUNCTION PANEL
ALBUMIN: 3.6 g/dL (ref 3.5–5.2)
ALK PHOS: 189 U/L — AB (ref 39–117)
ALT: 60 U/L — AB (ref 0–53)
AST: 67 U/L — ABNORMAL HIGH (ref 0–37)
Bilirubin, Direct: 0.3 mg/dL (ref 0.0–0.3)
Indirect Bilirubin: 0.8 mg/dL (ref 0.3–0.9)
Total Bilirubin: 1.1 mg/dL (ref 0.3–1.2)
Total Protein: 7.8 g/dL (ref 6.0–8.3)

## 2014-03-07 LAB — SURGICAL PCR SCREEN
MRSA, PCR: NEGATIVE
Staphylococcus aureus: NEGATIVE

## 2014-03-07 LAB — LIPASE, BLOOD
LIPASE: 649 U/L — AB (ref 11–59)
Lipase: 279 U/L — ABNORMAL HIGH (ref 11–59)

## 2014-03-07 SURGERY — IRRIGATION AND DEBRIDEMENT EXTREMITY
Anesthesia: General | Laterality: Left

## 2014-03-07 MED ORDER — SODIUM CHLORIDE 0.9 % IV SOLN
INTRAVENOUS | Status: DC
  Administered 2014-03-07 (×2): via INTRAVENOUS

## 2014-03-07 MED ORDER — ONDANSETRON HCL 4 MG PO TABS: 4.0000 mg | ORAL_TABLET | Freq: Four times a day (QID) | ORAL | Status: DC | PRN

## 2014-03-07 MED ORDER — SUCCINYLCHOLINE CHLORIDE 20 MG/ML IJ SOLN
INTRAMUSCULAR | Status: DC | PRN
  Administered 2014-03-07: 100 mg via INTRAVENOUS

## 2014-03-07 MED ORDER — MORPHINE SULFATE 2 MG/ML IJ SOLN
2.0000 mg | INTRAMUSCULAR | Status: DC | PRN
  Administered 2014-03-07 – 2014-03-11 (×10): 2 mg via INTRAVENOUS
  Filled 2014-03-07 (×12): qty 1

## 2014-03-07 MED ORDER — ACETAMINOPHEN 650 MG RE SUPP
650.0000 mg | Freq: Four times a day (QID) | RECTAL | Status: DC | PRN
Start: 2014-03-07 — End: 2014-03-07

## 2014-03-07 MED ORDER — DOCUSATE SODIUM 100 MG PO CAPS
100.0000 mg | ORAL_CAPSULE | Freq: Every day | ORAL | Status: DC | PRN
  Filled 2014-03-07: qty 1

## 2014-03-07 MED ORDER — SUCCINYLCHOLINE CHLORIDE 20 MG/ML IJ SOLN
INTRAMUSCULAR | Status: AC
  Filled 2014-03-07: qty 1

## 2014-03-07 MED ORDER — MORPHINE SULFATE 4 MG/ML IJ SOLN
4.0000 mg | INTRAMUSCULAR | Status: DC | PRN
  Administered 2014-03-07 (×2): 4 mg via INTRAVENOUS
  Filled 2014-03-07 (×2): qty 1

## 2014-03-07 MED ORDER — VANCOMYCIN HCL 10 G IV SOLR
1500.0000 mg | INTRAVENOUS | Status: AC
  Administered 2014-03-07: 1500 mg via INTRAVENOUS
  Filled 2014-03-07 (×2): qty 1500

## 2014-03-07 MED ORDER — ACETAMINOPHEN 650 MG RE SUPP: 650.0000 mg | Freq: Four times a day (QID) | RECTAL | Status: DC | PRN

## 2014-03-07 MED ORDER — ALUM & MAG HYDROXIDE-SIMETH 200-200-20 MG/5ML PO SUSP: 30.0000 mL | Freq: Four times a day (QID) | ORAL | Status: DC | PRN

## 2014-03-07 MED ORDER — LIDOCAINE HCL (CARDIAC) 20 MG/ML IV SOLN
INTRAVENOUS | Status: DC | PRN
  Administered 2014-03-07: 40 mg via INTRAVENOUS

## 2014-03-07 MED ORDER — SODIUM CHLORIDE 0.9 % IV SOLN
INTRAVENOUS | Status: DC
  Administered 2014-03-07 (×2): via INTRAVENOUS

## 2014-03-07 MED ORDER — VANCOMYCIN HCL 10 G IV SOLR
1500.0000 mg | Freq: Once | INTRAVENOUS | Status: AC
  Administered 2014-03-07: 1500 mg via INTRAVENOUS
  Filled 2014-03-07: qty 1500

## 2014-03-07 MED ORDER — ACETAMINOPHEN 325 MG PO TABS
650.0000 mg | ORAL_TABLET | Freq: Four times a day (QID) | ORAL | Status: DC | PRN
Start: 2014-03-07 — End: 2014-03-07

## 2014-03-07 MED ORDER — ALPRAZOLAM 0.5 MG PO TABS
1.0000 mg | ORAL_TABLET | Freq: Three times a day (TID) | ORAL | Status: DC | PRN
  Administered 2014-03-10: 1 mg via ORAL
  Filled 2014-03-07: qty 2

## 2014-03-07 MED ORDER — PREGABALIN 75 MG PO CAPS
225.0000 mg | ORAL_CAPSULE | Freq: Two times a day (BID) | ORAL | Status: DC
  Administered 2014-03-08 – 2014-03-09 (×2): 225 mg via ORAL
  Filled 2014-03-07: qty 3
  Filled 2014-03-07: qty 9
  Filled 2014-03-07 (×4): qty 3

## 2014-03-07 MED ORDER — LIDOCAINE HCL (CARDIAC) 20 MG/ML IV SOLN
INTRAVENOUS | Status: AC
  Filled 2014-03-07: qty 5

## 2014-03-07 MED ORDER — OXYCODONE HCL 5 MG/5ML PO SOLN: 5.0000 mg | Freq: Once | ORAL | Status: DC | PRN

## 2014-03-07 MED ORDER — VANCOMYCIN HCL 10 G IV SOLR
1250.0000 mg | Freq: Three times a day (TID) | INTRAVENOUS | Status: DC
  Filled 2014-03-07 (×4): qty 1250

## 2014-03-07 MED ORDER — ACETAMINOPHEN 325 MG PO TABS: 650.0000 mg | ORAL_TABLET | Freq: Four times a day (QID) | ORAL | Status: DC | PRN

## 2014-03-07 MED ORDER — VANCOMYCIN HCL IN DEXTROSE 1-5 GM/200ML-% IV SOLN
1000.0000 mg | Freq: Three times a day (TID) | INTRAVENOUS | Status: DC
  Administered 2014-03-08 (×2): 1000 mg via INTRAVENOUS
  Filled 2014-03-07 (×5): qty 200

## 2014-03-07 MED ORDER — HYDROMORPHONE HCL PF 1 MG/ML IJ SOLN: 0.2500 mg | INTRAMUSCULAR | Status: DC | PRN

## 2014-03-07 MED ORDER — ONDANSETRON HCL 4 MG/2ML IJ SOLN: 4.0000 mg | Freq: Four times a day (QID) | INTRAMUSCULAR | Status: DC | PRN

## 2014-03-07 MED ORDER — ONDANSETRON HCL 4 MG/2ML IJ SOLN
INTRAMUSCULAR | Status: DC | PRN
  Administered 2014-03-07: 4 mg via INTRAVENOUS

## 2014-03-07 MED ORDER — FENTANYL CITRATE 0.05 MG/ML IJ SOLN
INTRAMUSCULAR | Status: DC | PRN
  Administered 2014-03-07 (×2): 100 ug via INTRAVENOUS
  Administered 2014-03-07: 50 ug via INTRAVENOUS

## 2014-03-07 MED ORDER — PROPOFOL 10 MG/ML IV BOLUS
INTRAVENOUS | Status: AC
  Filled 2014-03-07: qty 20

## 2014-03-07 MED ORDER — CHLORHEXIDINE GLUCONATE 4 % EX LIQD
60.0000 mL | Freq: Once | CUTANEOUS | Status: DC
Start: 2014-03-08 — End: 2014-03-07
  Filled 2014-03-07: qty 60

## 2014-03-07 MED ORDER — OXYCODONE HCL 5 MG PO TABS: 5.0000 mg | ORAL_TABLET | Freq: Once | ORAL | Status: DC | PRN

## 2014-03-07 MED ORDER — PROPOFOL 10 MG/ML IV BOLUS
INTRAVENOUS | Status: DC | PRN
  Administered 2014-03-07: 150 mg via INTRAVENOUS

## 2014-03-07 MED ORDER — FENTANYL CITRATE 0.05 MG/ML IJ SOLN
INTRAMUSCULAR | Status: AC
  Filled 2014-03-07: qty 5

## 2014-03-07 MED ORDER — ONDANSETRON HCL 4 MG/2ML IJ SOLN
4.0000 mg | Freq: Four times a day (QID) | INTRAMUSCULAR | Status: DC | PRN
  Administered 2014-03-07: 4 mg via INTRAVENOUS
  Filled 2014-03-07 (×2): qty 2

## 2014-03-07 SURGICAL SUPPLY — 59 items
BANDAGE ELASTIC 3 VELCRO ST LF (GAUZE/BANDAGES/DRESSINGS) ×3 IMPLANT
BANDAGE ELASTIC 4 VELCRO ST LF (GAUZE/BANDAGES/DRESSINGS) ×6 IMPLANT
BNDG COHESIVE 1X5 TAN STRL LF (GAUZE/BANDAGES/DRESSINGS) IMPLANT
BNDG CONFORM 2 STRL LF (GAUZE/BANDAGES/DRESSINGS) IMPLANT
BNDG ESMARK 4X9 LF (GAUZE/BANDAGES/DRESSINGS) ×6 IMPLANT
BNDG GAUZE ELAST 4 BULKY (GAUZE/BANDAGES/DRESSINGS) ×3 IMPLANT
CORDS BIPOLAR (ELECTRODE) ×3 IMPLANT
COVER SURGICAL LIGHT HANDLE (MISCELLANEOUS) ×3 IMPLANT
CUFF TOURNIQUET SINGLE 18IN (TOURNIQUET CUFF) ×3 IMPLANT
CUFF TOURNIQUET SINGLE 24IN (TOURNIQUET CUFF) IMPLANT
DRAIN PENROSE 1/4X12 LTX STRL (WOUND CARE) IMPLANT
DRAPE SURG 17X23 STRL (DRAPES) ×3 IMPLANT
DRSG ADAPTIC 3X8 NADH LF (GAUZE/BANDAGES/DRESSINGS) ×3 IMPLANT
DRSG KUZMA FLUFF (GAUZE/BANDAGES/DRESSINGS) ×6 IMPLANT
ELECT REM PT RETURN 9FT ADLT (ELECTROSURGICAL)
ELECTRODE REM PT RTRN 9FT ADLT (ELECTROSURGICAL) IMPLANT
GAUZE SPONGE 4X4 12PLY STRL (GAUZE/BANDAGES/DRESSINGS) ×3 IMPLANT
GAUZE XEROFORM 1X8 LF (GAUZE/BANDAGES/DRESSINGS) ×3 IMPLANT
GAUZE XEROFORM 5X9 LF (GAUZE/BANDAGES/DRESSINGS) IMPLANT
GLOVE BIOGEL PI IND STRL 6.5 (GLOVE) ×1 IMPLANT
GLOVE BIOGEL PI IND STRL 7.0 (GLOVE) ×1 IMPLANT
GLOVE BIOGEL PI IND STRL 8.5 (GLOVE) ×1 IMPLANT
GLOVE BIOGEL PI INDICATOR 6.5 (GLOVE) ×2
GLOVE BIOGEL PI INDICATOR 7.0 (GLOVE) ×2
GLOVE BIOGEL PI INDICATOR 8.5 (GLOVE) ×2
GLOVE SURG ORTHO 8.0 STRL STRW (GLOVE) ×3 IMPLANT
GOWN STRL REUS W/ TWL LRG LVL3 (GOWN DISPOSABLE) ×3 IMPLANT
GOWN STRL REUS W/ TWL XL LVL3 (GOWN DISPOSABLE) ×1 IMPLANT
GOWN STRL REUS W/TWL LRG LVL3 (GOWN DISPOSABLE) ×6
GOWN STRL REUS W/TWL XL LVL3 (GOWN DISPOSABLE) ×2
HANDPIECE INTERPULSE COAX TIP (DISPOSABLE)
KIT BASIN OR (CUSTOM PROCEDURE TRAY) ×3 IMPLANT
KIT ROOM TURNOVER OR (KITS) ×3 IMPLANT
MANIFOLD NEPTUNE II (INSTRUMENTS) ×3 IMPLANT
NEEDLE HYPO 25GX1X1/2 BEV (NEEDLE) IMPLANT
NS IRRIG 1000ML POUR BTL (IV SOLUTION) ×3 IMPLANT
PACK ORTHO EXTREMITY (CUSTOM PROCEDURE TRAY) ×3 IMPLANT
PAD ARMBOARD 7.5X6 YLW CONV (MISCELLANEOUS) ×6 IMPLANT
PAD CAST 4YDX4 CTTN HI CHSV (CAST SUPPLIES) ×1 IMPLANT
PADDING CAST COTTON 4X4 STRL (CAST SUPPLIES) ×2
SET HNDPC FAN SPRY TIP SCT (DISPOSABLE) IMPLANT
SOAP 2 % CHG 4 OZ (WOUND CARE) ×3 IMPLANT
SPONGE GAUZE 4X4 12PLY STER LF (GAUZE/BANDAGES/DRESSINGS) ×3 IMPLANT
SPONGE LAP 18X18 X RAY DECT (DISPOSABLE) ×3 IMPLANT
SPONGE LAP 4X18 X RAY DECT (DISPOSABLE) ×3 IMPLANT
SUCTION FRAZIER TIP 10 FR DISP (SUCTIONS) ×3 IMPLANT
SUT ETHILON 4 0 PS 2 18 (SUTURE) IMPLANT
SUT ETHILON 5 0 P 3 18 (SUTURE) ×2
SUT NYLON ETHILON 5-0 P-3 1X18 (SUTURE) ×1 IMPLANT
SUT PROLENE 4 0 PS 2 18 (SUTURE) ×3 IMPLANT
SYR CONTROL 10ML LL (SYRINGE) IMPLANT
TOWEL OR 17X24 6PK STRL BLUE (TOWEL DISPOSABLE) ×3 IMPLANT
TOWEL OR 17X26 10 PK STRL BLUE (TOWEL DISPOSABLE) ×3 IMPLANT
TUBE ANAEROBIC SPECIMEN COL (MISCELLANEOUS) IMPLANT
TUBE CONNECTING 12'X1/4 (SUCTIONS) ×1
TUBE CONNECTING 12X1/4 (SUCTIONS) ×2 IMPLANT
UNDERPAD 30X30 INCONTINENT (UNDERPADS AND DIAPERS) ×3 IMPLANT
WATER STERILE IRR 1000ML POUR (IV SOLUTION) ×3 IMPLANT
YANKAUER SUCT BULB TIP NO VENT (SUCTIONS) ×3 IMPLANT

## 2014-03-07 NOTE — ED Notes (Signed)
Injury to left index finger last year; MRSA infection resulted. Followed by PCP. Started swelling on Wednesday morning with red streaking up left hand; Thursday drained by PCP, and given antibiotics. Called yesterday from [facility] and told blood cultures positive and told to come for treatment, but did not want to go. Yesterday started vomiting, abd pain, and fever so went to [facility]. Admitted last night for sepsis, given IV antibiotics, for transport today to see Dr. Apolonio Schneiders for surgery. Signed out AMA from [facility] so wife could drive him rather than pay for [facility] transport.

## 2014-03-07 NOTE — Consult Note (Signed)
Patient seen, chart reviewed. Apparently there is no orthopedic coverage this weekend here. My feeling is he needs transfer to home health for further evaluation treatment by hand surgery. Further treatment for his MRSA bacteremia also needs to be performed. This was explained to the patient and family, who seemed to have many issues concerning the all around care of this finger both as an inpatient as well as an outpatient.

## 2014-03-07 NOTE — Transfer of Care (Signed)
Immediate Anesthesia Transfer of Care Note  Patient: Benjamin Carter  Procedure(s) Performed: Procedure(s): IRRIGATION AND DEBRIDEMENT INDEX FINGER (Left)  Patient Location: PACU  Anesthesia Type:General  Level of Consciousness: awake  Airway & Oxygen Therapy: Patient Spontanous Breathing  Post-op Assessment: Report given to PACU RN and Post -op Vital signs reviewed and stable  Post vital signs: Reviewed and stable  Complications: No apparent anesthesia complications

## 2014-03-07 NOTE — Progress Notes (Signed)
ANTIBIOTIC CONSULT NOTE-Preliminary  Pharmacy Consult for Vancomycin Indication: Bacteremia  Allergies  Allergen Reactions  . Sulfites     Caused elevated liver enzymes  . Toradol [Ketorolac Tromethamine] Rash    Patient Measurements: Height: 6\' 6"  (198.1 cm) Weight: 264 lb 15.9 oz (120.2 kg) IBW/kg (Calculated) : 91.4  Vital Signs: Temp: 98.7 F (37.1 C) (08/16 0338) Temp src: Oral (08/16 0338) BP: 126/69 mmHg (08/16 0338) Pulse Rate: 66 (08/16 0338)  Labs:  Recent Labs  03/06/14 2219 03/06/14 2312  WBC 8.8  --   HGB 13.3  --   PLT 207  --   CREATININE  --  1.11    Estimated Creatinine Clearance: 137.8 ml/min (by C-G formula based on Cr of 1.11).  No results found for this basename: VANCOTROUGH, Corlis Leak, VANCORANDOM, GENTTROUGH, GENTPEAK, GENTRANDOM, TOBRATROUGH, TOBRAPEAK, TOBRARND, AMIKACINPEAK, AMIKACINTROU, AMIKACIN,  in the last 72 hours   Microbiology: Recent Results (from the past 720 hour(s))  CULTURE, BLOOD (ROUTINE X 2)     Status: None   Collection Time    03/03/14  7:15 PM      Result Value Ref Range Status   Specimen Description BLOOD LEFT ARM   Final   Special Requests     Final   Value: BOTTLES DRAWN AEROBIC AND ANAEROBIC AEB=13CC ANA=12CC   Culture  Setup Time     Final   Value: 03/04/2014 20:33     Performed at [facility]   Culture     Final   Value: STAPHYLOCOCCUS AUREUS     Note: Gram Stain Report Called to,Read Back By and Verified With: GAMMONS S (FLOW MANAGER) AT 1318 ON 03/04/2014 BY Tyrone Schimke M     Performed at [facility]   Report Status PENDING   Incomplete  CULTURE, BLOOD (ROUTINE X 2)     Status: None   Collection Time    03/03/14  7:18 PM      Result Value Ref Range Status   Specimen Description BLOOD RIGHT ARM   Final   Special Requests     Final   Value: BOTTLES DRAWN AEROBIC AND ANAEROBIC AEB=10CC ANA=12CC   Culture  Setup Time     Final   Value: 03/04/2014 22:46     Performed at [facility]    Culture     Final   Value: STAPHYLOCOCCUS AUREUS     Note: Gram Stain Report Called to,Read Back By and Verified With: GAMMONS S. AT 1318 ON 03/04/14 BY BAUGHAM M. Performed at [facility]     Performed at [facility]   Report Status PENDING   Incomplete    Medical History: Past Medical History  Diagnosis Date  . Sleep apnea     Stop Bang score of 4    Medications: Vancomycin 1 Gm IV in the ED at @2200  03/06/14   Assessment: 33 yo male with cellulitis of left index finger; seen 3 days ago in ED and given outpatient treatment with oral Bactrim. Pt is to be admitted for IV Vancomycin due to blood cultures positive for Staph aureus and patient is symptomatic with chills.  Goal of Therapy:  Vancomycin troughs 15-20 mcg/ml  Plan:  Preliminary review of pertinent patient information completed.  Protocol will be initiated with a one-time dose of Vancomycin 1500 mg after the initial 1 Gm IV dose given in the ED at @2200 .[place] clinical pharmacist will complete review during morning rounds to assess patient and finalize treatment  regimen.  Norberto Sorenson, [facility] 03/07/2014,3:54 AM

## 2014-03-07 NOTE — Progress Notes (Signed)
ANTIBIOTIC CONSULT NOTE - INITIAL  Pharmacy Consult for Vancomycin  Indication: MRSA bacteremia   Allergies  Allergen Reactions  . Bactrim [Sulfamethoxazole-Tmp Ds]     Caused pancreatitis  . Sulfites     Caused elevated liver enzymes  . Toradol [Ketorolac Tromethamine] Rash    Patient Measurements: Height: 6\' 6"  (198.1 cm) Weight: 250 lb (113.399 kg) IBW/kg (Calculated) : 91.4 Adjusted Body Weight: n/a   Vital Signs: Temp: 97.8 F (36.6 C) (08/16 1631) Temp src: Oral (08/16 1631) BP: 114/69 mmHg (08/16 1631) Pulse Rate: 51 (08/16 1631) Intake/Output from previous day:   Intake/Output from this shift:    Labs:  Recent Labs  03/06/14 2219 03/06/14 2312 03/07/14 0637  WBC 8.8  --  6.7  HGB 13.3  --  12.6*  PLT 207  --  176  CREATININE  --  1.11 1.06   Estimated Creatinine Clearance: 140.5 ml/min (by C-G formula based on Cr of 1.06). No results found for this basename: VANCOTROUGH, Corlis Leak, VANCORANDOM, [place], GENTPEAK, GENTRANDOM, TOBRATROUGH, TOBRAPEAK, TOBRARND, AMIKACINPEAK, AMIKACINTROU, AMIKACIN,  in the last 72 hours   Microbiology: Recent Results (from the past 720 hour(s))  CULTURE, BLOOD (ROUTINE X 2)     Status: None   Collection Time    03/03/14  7:15 PM      Result Value Ref Range Status   Specimen Description BLOOD LEFT ARM   Final   Special Requests     Final   Value: BOTTLES DRAWN AEROBIC AND ANAEROBIC AEB=13CC ANA=12CC   Culture  Setup Time     Final   Value: 03/04/2014 20:33     Performed at [facility]   Culture     Final   Value: METHICILLIN RESISTANT STAPHYLOCOCCUS AUREUS     Note: SUSCEPTIBILITIES PERFORMED ON PREVIOUS CULTURE WITHIN THE LAST 5 DAYS.     Note: Gram Stain Report Called to,Read Back By and Verified With: GAMMONS S (FLOW MANAGER) AT 1318 ON 03/04/2014 BY Tyrone Schimke M     Performed at [facility]   Report Status 03/07/2014 FINAL   Final  CULTURE, BLOOD (ROUTINE X 2)     Status: None   Collection  Time    03/03/14  7:18 PM      Result Value Ref Range Status   Specimen Description BLOOD RIGHT ARM   Final   Special Requests     Final   Value: BOTTLES DRAWN AEROBIC AND ANAEROBIC AEB=10CC ANA=12CC   Culture  Setup Time     Final   Value: 03/04/2014 22:46     Performed at [facility]   Culture     Final   Value: METHICILLIN RESISTANT STAPHYLOCOCCUS AUREUS     Note: This organism DOES NOT demonstrate inducible Clindamycin resistance in vitro.     Note: Gram Stain Report Called to,Read Back By and Verified With: GAMMONS S. AT 1318 ON 03/04/14 BY BAUGHAM M. Performed at [place], READ BACK BY AND VERIFIED WITH: [place] 03/07/14 @ 9:19PM BY RUSCOE A.     Performed at [facility]   Report Status 03/07/2014 FINAL   Final   Organism ID, Bacteria METHICILLIN RESISTANT STAPHYLOCOCCUS AUREUS   Final    Medical History: Past Medical History  Diagnosis Date  . Sleep apnea     Stop Bang score of 4    Medications:  Prescriptions prior to admission  Medication Sig Dispense Refill  . ALPRAZolam (XANAX) 1 MG tablet Take  1 mg by mouth 3 (three) times daily as needed for anxiety.      . pregabalin (LYRICA) 225 MG capsule Take 225 mg by mouth 2 (two) times daily.      . vancomycin (VANCOCIN) 250 MG capsule Take 500 mg by mouth 2 (two) times daily.       Assessment: 29 YOM presented to the ED after signing out AMA at [facility] for positive blood cultures for staph aureus and worsening infection in his left finger. ID has been consulted. Pharmacy consulted to start Vancomycin as empiric therapy. WBC is wnl. Pt is afebrile. CrCl > 140 mL/min   Goal of Therapy:  Vancomycin trough level 15-20 mcg/ml  Plan:  -Start patient on Vancomycin 1 gm IV Q 8 hours -Monitor CBC, renal fx, cultures and patient's clinical progress -VT at [facility], PharmD.  Clinical Pharmacist Pager [phone]

## 2014-03-07 NOTE — ED Notes (Signed)
Lab called and asked about hold up on lab results.

## 2014-03-07 NOTE — Brief Op Note (Signed)
03/07/2014  5:33 PM  PATIENT:  Benjamin Carter  33 y.o. male  PRE-OPERATIVE DIAGNOSIS:  left index finger abscess  POST-OPERATIVE DIAGNOSIS: SAME  PROCEDURE:  Procedure(s): IRRIGATION AND DEBRIDEMENT INDEX FINGER (Left)  SURGEON:  Surgeon(s) and Role:    * Linna Hoff, MD - Primary  PHYSICIAN ASSISTANT:   ASSISTANTS: none   ANESTHESIA:   general  EBL:     BLOOD ADMINISTERED:none  DRAINS: none   LOCAL MEDICATIONS USED:  NONE  SPECIMEN:  No Specimen  DISPOSITION OF SPECIMEN:  N/A  COUNTS:  YES  TOURNIQUET:    DICTATION: [id number]  PLAN OF CARE: Admit to inpatient   PATIENT DISPOSITION:  PACU - hemodynamically stable.   Delay start of Pharmacological VTE agent (>24hrs) due to surgical blood loss or risk of bleeding: not applicable

## 2014-03-07 NOTE — Progress Notes (Signed)
ANTIBIOTIC CONSULT NOTE-follow up  Pharmacy Consult for Vancomycin Indication: Bacteremia  Allergies  Allergen Reactions  . Sulfites     Caused elevated liver enzymes  . Toradol [Ketorolac Tromethamine] Rash   Patient Measurements: Height: 6\' 6"  (198.1 cm) Weight: 264 lb 15.9 oz (120.2 kg) IBW/kg (Calculated) : 91.4  Vital Signs: Temp: 98.4 F (36.9 C) (08/16 0727) Temp src: Oral (08/16 0727) BP: 96/50 mmHg (08/16 0727) Pulse Rate: 71 (08/16 0727)  Labs:  Recent Labs  03/06/14 2219 03/06/14 2312 03/07/14 0637  WBC 8.8  --  6.7  HGB 13.3  --  12.6*  PLT 207  --  176  CREATININE  --  1.11 1.06   Estimated Creatinine Clearance: 144.3 ml/min (by C-G formula based on Cr of 1.06).  No results found for this basename: VANCOTROUGH, Corlis Leak, VANCORANDOM, GENTTROUGH, GENTPEAK, GENTRANDOM, TOBRATROUGH, TOBRAPEAK, TOBRARND, AMIKACINPEAK, AMIKACINTROU, AMIKACIN,  in the last 72 hours   Microbiology: Recent Results (from the past 720 hour(s))  CULTURE, BLOOD (ROUTINE X 2)     Status: None   Collection Time    03/03/14  7:15 PM      Result Value Ref Range Status   Specimen Description BLOOD LEFT ARM   Final   Special Requests     Final   Value: BOTTLES DRAWN AEROBIC AND ANAEROBIC AEB=13CC ANA=12CC   Culture  Setup Time     Final   Value: 03/04/2014 20:33     Performed at [facility]   Culture     Final   Value: STAPHYLOCOCCUS AUREUS     Note: Gram Stain Report Called to,Read Back By and Verified With: GAMMONS S (FLOW MANAGER) AT 1318 ON 03/04/2014 BY Tyrone Schimke M     Performed at [facility]   Report Status PENDING   Incomplete  CULTURE, BLOOD (ROUTINE X 2)     Status: None   Collection Time    03/03/14  7:18 PM      Result Value Ref Range Status   Specimen Description BLOOD RIGHT ARM   Final   Special Requests     Final   Value: BOTTLES DRAWN AEROBIC AND ANAEROBIC AEB=10CC ANA=12CC   Culture  Setup Time     Final   Value: 03/04/2014 22:46   Performed at [facility]   Culture     Final   Value: STAPHYLOCOCCUS AUREUS     Note: Gram Stain Report Called to,Read Back By and Verified With: GAMMONS S. AT 1318 ON 03/04/14 BY BAUGHAM M. Performed at [facility]     Performed at [facility]   Report Status PENDING   Incomplete   Medical History: Past Medical History  Diagnosis Date  . Sleep apnea     Stop Bang score of 4   Antibiotics: Vancomycin 8/15>>   Assessment: 33 yo male with cellulitis of left index finger; seen 3 days ago in ED and given outpatient treatment with oral Bactrim. Pt is to be admitted for IV Vancomycin due to blood cultures positive for Staph aureus and patient is symptomatic with chills. Pt is obese.  Normalized ClCr ~ 138ml/min  Goal of Therapy:  Vancomycin troughs 15-20 mcg/ml  Plan:  Vancomycin 1250mg  IV q8hrs Check trough at steady state Monitor labs, renal fxn, and cultures  Hart Robinsons A, RPH 03/07/2014,8:38 AM

## 2014-03-07 NOTE — Anesthesia Procedure Notes (Signed)
Procedure Name: Intubation Date/Time: 03/07/2014 5:41 PM Performed by: Marinda Elk A Pre-anesthesia Checklist: Emergency Drugs available, Suction available, Patient being monitored, Patient identified and Timeout performed Patient Re-evaluated:Patient Re-evaluated prior to inductionOxygen Delivery Method: Circle system utilized Preoxygenation: Pre-oxygenation with 100% oxygen Intubation Type: IV induction, Rapid sequence and Cricoid Pressure applied Laryngoscope Size: Mac and 3 Grade View: Grade I Tube type: Oral Tube size: 7.5 mm Number of attempts: 1 Airway Equipment and Method: Stylet Placement Confirmation: ETT inserted through vocal cords under direct vision,  breath sounds checked- equal and bilateral and positive ETCO2 Secured at: 21 cm Tube secured with: Tape Dental Injury: Teeth and Oropharynx as per pre-operative assessment

## 2014-03-07 NOTE — Anesthesia Preprocedure Evaluation (Addendum)
Anesthesia Evaluation  Patient identified by MRN, date of birth, ID band Patient awake    Reviewed: Allergy & Precautions, H&P , NPO status , Patient's Chart, lab work & pertinent test results  Airway Mallampati: I TM Distance: >3 FB Neck ROM: Full    Dental no notable dental hx. (+) Dental Advisory Given, Poor Dentition   Pulmonary Current Smoker,  breath sounds clear to auscultation  Pulmonary exam normal       Cardiovascular negative cardio ROS  Rhythm:Regular Rate:Normal     Neuro/Psych negative neurological ROS  negative psych ROS   GI/Hepatic Elevated liver enzymes H/o pancreatitis   Endo/Other  negative endocrine ROS  Renal/GU negative Renal ROS  negative genitourinary   Musculoskeletal   Abdominal   Peds  Hematology negative hematology ROS (+)   Anesthesia Other Findings   Reproductive/Obstetrics negative OB ROS                         Anesthesia Physical Anesthesia Plan  ASA: II and emergent  Anesthesia Plan: General   Post-op Pain Management:    Induction: Intravenous, Rapid sequence and Cricoid pressure planned  Airway Management Planned: Oral ETT  Additional Equipment:   Intra-op Plan:   Post-operative Plan: Extubation in OR  Informed Consent: I have reviewed the patients History and Physical, chart, labs and discussed the procedure including the risks, benefits and alternatives for the proposed anesthesia with the patient or authorized representative who has indicated his/her understanding and acceptance.   Dental advisory given  Plan Discussed with: CRNA  Anesthesia Plan Comments:         Anesthesia Quick Evaluation

## 2014-03-07 NOTE — Consult Note (Signed)
HPI: Benjamin Carter is a 33 y.o. 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 Patient was seen this past Wednesday, March 03, 2014 at [facility], due to swelling and erythema of the left index finger. Patient was started on Bactrim and did not want to be admitted. Patient did have blood cultures drawn from that ER visit, which were positive for MRSA. Patient was called several times, and refused to come to the hospital for evaluation and admission. Patient did return this morning at the request of his wife, he was admitted to [facility]. During this admission, patient needed to be seen by a orthopedic/hand surgeon, therefore would need to be transferred to [facility]. Patient opted to leave [place] as he did not have the money for EMS transport to [place]. PT HERE FOR SURGERY ON LEFT INDEX FINGER  Review of Systems:  Constitutional: Denies fever, chills, diaphoresis, appetite change and fatigue.  HEENT: Denies photophobia, eye pain, redness, hearing loss, ear pain, congestion, sore throat, rhinorrhea, sneezing, mouth sores, trouble swallowing, neck pain, neck stiffness and tinnitus.  Respiratory: Denies SOB, DOE, cough, chest tightness, and wheezing.  Cardiovascular: Denies chest pain, palpitations and leg swelling.  Gastrointestinal: Denies nausea, vomiting, abdominal pain, diarrhea, constipation, blood in stool and abdominal distention.  Genitourinary: Denies dysuria, urgency, frequency, hematuria, flank pain and difficulty urinating.  Musculoskeletal: Left index finger swelling.  Skin: Complains of finger infection and swelling.  Neurological: Denies dizziness, seizures, syncope, weakness, light-headedness, numbness and headaches.  Hematological: Denies adenopathy. Easy bruising, personal or family bleeding  history  Psychiatric/Behavioral: Has history of anxiety.  Reason for Consult: LEFT INDEX FINGER INFECTION Referring Physician: DR. Hebert Soho [facility]   Past Medical History  Diagnosis Date  . Sleep apnea     Stop Bang score of 4    Past Surgical History  Procedure Laterality Date  . Back surgery  x2  . Cholecystectomy N/A 12/07/2013    Procedure: LAPAROSCOPIC CHOLECYSTECTOMY;  Surgeon: Jamesetta So, MD;  Location: [facility];  Service: General;  Laterality: N/A;    History reviewed. No pertinent family history.  Social History:  reports that he has been smoking Cigarettes.  He has a 20 pack-year smoking history. He does not have any smokeless tobacco history on file. He reports that he drinks alcohol. He reports that he does not use illicit drugs.  Allergies:  Allergies  Allergen Reactions  . Bactrim [Sulfamethoxazole-Tmp Ds]     Caused pancreatitis  . Sulfites     Caused elevated liver enzymes  . Toradol [Ketorolac Tromethamine] Rash    Medications: I have reviewed the patient's current medications.   Results for orders placed during the hospital encounter of 03/06/14 (from the past 48 hour(s))  CBC WITH DIFFERENTIAL     Status: Abnormal   Collection Time    03/06/14 10:19 PM      Result Value Ref Range   WBC 8.8  4.0 - 10.5 K/uL   RBC 4.14 (*) 4.22 - 5.81 MIL/uL   Hemoglobin 13.3  13.0 - 17.0 g/dL   HCT 36.9 (*) 39.0 - 52.0 %   MCV 89.1  78.0 - 100.0 fL   MCH 32.1  26.0 - 34.0 pg   MCHC 36.0  30.0 - 36.0 g/dL   RDW 12.4  11.5 - 15.5 %  Platelets 207  150 - 400 K/uL   Neutrophils Relative % 67  43 - 77 %   Neutro Abs 5.9  1.7 - 7.7 K/uL   Lymphocytes Relative 22  12 - 46 %   Lymphs Abs 2.0  0.7 - 4.0 K/uL   Monocytes Relative 10  3 - 12 %   Monocytes Absolute 0.8  0.1 - 1.0 K/uL   Eosinophils Relative 1  0 - 5 %   Eosinophils Absolute 0.1  0.0 - 0.7 K/uL   Basophils Relative 0  0 - 1 %   Basophils Absolute 0.0  0.0 - 0.1 K/uL  LIPASE, BLOOD     Status:  Abnormal   Collection Time    03/06/14 11:12 PM      Result Value Ref Range   Lipase 649 (*) 11 - 59 U/L  HEPATIC FUNCTION PANEL     Status: Abnormal   Collection Time    03/06/14 11:12 PM      Result Value Ref Range   Total Protein 7.8  6.0 - 8.3 g/dL   Albumin 3.6  3.5 - 5.2 g/dL   AST 67 (*) 0 - 37 U/L   ALT 60 (*) 0 - 53 U/L   Alkaline Phosphatase 189 (*) 39 - 117 U/L   Total Bilirubin 1.1  0.3 - 1.2 mg/dL   Bilirubin, Direct 0.3  0.0 - 0.3 mg/dL   Indirect Bilirubin 0.8  0.3 - 0.9 mg/dL  BASIC METABOLIC PANEL     Status: Abnormal   Collection Time    03/06/14 11:12 PM      Result Value Ref Range   Sodium 132 (*) 137 - 147 mEq/L   Potassium 3.7  3.7 - 5.3 mEq/L   Chloride 97  96 - 112 mEq/L   CO2 22  19 - 32 mEq/L   Glucose, Bld 163 (*) 70 - 99 mg/dL   BUN 12  6 - 23 mg/dL   Creatinine, Ser 1.11  0.50 - 1.35 mg/dL   Calcium 8.8  8.4 - 10.5 mg/dL   GFR calc non Af Amer 86 (*) >90 mL/min   GFR calc Af Amer >90  >90 mL/min   Comment: (NOTE)     The eGFR has been calculated using the CKD EPI equation.     This calculation has not been validated in all clinical situations.     eGFR's persistently <90 mL/min signify possible Chronic Kidney     Disease.   Anion gap 13  5 - 15  URINALYSIS, ROUTINE W REFLEX MICROSCOPIC     Status: Abnormal   Collection Time    03/07/14 12:16 AM      Result Value Ref Range   Color, Urine YELLOW  YELLOW   APPearance CLEAR  CLEAR   Specific Gravity, Urine <1.005 (*) 1.005 - 1.030   pH 6.0  5.0 - 8.0   Glucose, UA NEGATIVE  NEGATIVE mg/dL   Hgb urine dipstick NEGATIVE  NEGATIVE   Bilirubin Urine NEGATIVE  NEGATIVE   Ketones, ur NEGATIVE  NEGATIVE mg/dL   Protein, ur NEGATIVE  NEGATIVE mg/dL   Urobilinogen, UA 0.2  0.0 - 1.0 mg/dL   Nitrite NEGATIVE  NEGATIVE   Leukocytes, UA NEGATIVE  NEGATIVE   Comment: MICROSCOPIC NOT DONE ON URINES WITH NEGATIVE PROTEIN, BLOOD, LEUKOCYTES, NITRITE, OR GLUCOSE <1000 mg/dL.  CBC     Status: Abnormal    Collection Time    03/07/14  6:37 AM  Result Value Ref Range   WBC 6.7  4.0 - 10.5 K/uL   RBC 3.96 (*) 4.22 - 5.81 MIL/uL   Hemoglobin 12.6 (*) 13.0 - 17.0 g/dL   HCT 35.7 (*) 39.0 - 52.0 %   MCV 90.2  78.0 - 100.0 fL   MCH 31.8  26.0 - 34.0 pg   MCHC 35.3  30.0 - 36.0 g/dL   RDW 12.4  11.5 - 15.5 %   Platelets 176  150 - 400 K/uL  COMPREHENSIVE METABOLIC PANEL     Status: Abnormal   Collection Time    03/07/14  6:37 AM      Result Value Ref Range   Sodium 134 (*) 137 - 147 mEq/L   Potassium 4.1  3.7 - 5.3 mEq/L   Chloride 100  96 - 112 mEq/L   CO2 25  19 - 32 mEq/L   Glucose, Bld 132 (*) 70 - 99 mg/dL   BUN 9  6 - 23 mg/dL   Creatinine, Ser 1.06  0.50 - 1.35 mg/dL   Calcium 8.3 (*) 8.4 - 10.5 mg/dL   Total Protein 6.9  6.0 - 8.3 g/dL   Albumin 3.3 (*) 3.5 - 5.2 g/dL   AST 136 (*) 0 - 37 U/L   ALT 100 (*) 0 - 53 U/L   Alkaline Phosphatase 246 (*) 39 - 117 U/L   Total Bilirubin 1.5 (*) 0.3 - 1.2 mg/dL   GFR calc non Af Amer >90  >90 mL/min   GFR calc Af Amer >90  >90 mL/min   Comment: (NOTE)     The eGFR has been calculated using the CKD EPI equation.     This calculation has not been validated in all clinical situations.     eGFR's persistently <90 mL/min signify possible Chronic Kidney     Disease.   Anion gap 9  5 - 15  LIPASE, BLOOD     Status: Abnormal   Collection Time    03/07/14  9:18 AM      Result Value Ref Range   Lipase 279 (*) 11 - 59 U/L    Dg Hand Complete Left  03/06/2014   CLINICAL DATA:  Pain, swelling and erythema at a wound between the index and middle fingers of the left hand. Purulent discharge.  EXAM: LEFT HAND - COMPLETE 3+ VIEW  COMPARISON:  None.  FINDINGS: The known soft tissue wound is not well characterized. There is diffuse soft tissue swelling about the second and third metacarpophalangeal joints, and along the second finger. No radiopaque foreign bodies are seen. There is no evidence of osseous erosion at this time.  Visualized joint  spaces are preserved. The carpal rows appear grossly intact, and demonstrate normal alignment. 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  IMPRESSION: 1. Known soft tissue wound is not well characterized. No radiopaque foreign bodies seen. Diffuse soft tissue swelling about the second and third metacarpophalangeal joints, and along the second finger. 2. No evidence of osseous erosion at this time. 3. 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   Electronically Signed   By: Garald Balding M.D.   On: 03/06/2014 22:18    ROS AS MENTIONED ABOVE   Blood pressure 114/69, pulse 51, temperature 97.8 F (36.6 C), temperature source Oral, resp. rate 18, height '6\' 6"'  (1.981 m), weight 113.399 kg (250  lb), SpO2 98.00%. Physical Exam General Appearance:  Alert, cooperative, no distress, appears stated age  Head:  Normocephalic, without obvious abnormality, atraumatic  Eyes:  Pupils equal, conjunctiva/corneas clear,         Throat: Lips, mucosa, and tongue normal; teeth and gums normal  Neck: No visible masses     Lungs:   respirations unlabored  Chest Wall:  No tenderness or deformity  Heart:  Regular rate and rhythm,  Abdomen:   Soft, non-tender,         Extremities: LEFT INDEX FINGER: SWOLLEN SAUSAGE DIGIT WITH DRAINING PURULENCE. 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 THE RISKS AND BENEFITS OF SURGERY INCLUDE BUT NOT LIMITED TO BLEEDING INFECTION, DAMAGE TO NEARBY NERVES ARTERIES TENDONS, FAILURE OF SURGERY TO ACCOMPLISH ITS INTENDED GOALS, PERSISTENT SYMPTOMS AND NEED FOR FURTHER SURGICAL INTERVENTION. WITH THIS IN MIND WE WILL PROCEED. I HAVE DISCUSSED WITH THE PATIENT THE PRE AND POSTOPERATIVE REGIMEN AND THE DOS AND DON'TS. PT VOICED UNDERSTANDING AND INFORMED CONSENT SIGNED.  Linna Hoff 03/07/2014, 5:30 PM

## 2014-03-07 NOTE — Progress Notes (Signed)
Received report from [facility] in ED.

## 2014-03-07 NOTE — Consult Note (Signed)
Staph aureus bacteremia consult  History reviewed.  I spoke to PCP yesterday and pharmacy called patient.  Now admitted with SAB.  On vancomycin, is MRSA.  Will need repeat blood cultures after 48 hours on vancomycin to assure clearance, TTE, TEE if TTE negative and long term IV vancomycin.  2 weeks if TEE negative.  Please call for any questions.  thanks

## 2014-03-07 NOTE — H&P (Signed)
History and Physical  Benjamin Carter UXY:[id number] DOB: 1981/05/12 DOA: 03/06/2014  Referring physician: emergency department PCP: Neale Burly, MD   Chief Complaint: finger infection, abdominal pain  HPI: Benjamin Carter is a 33 y.o. 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  He was seen in the emergency department 3 days ago due to swelling and erythema of his left index finger. He was placed on Bactrim because he did not want to be admitted.blood cultures have been drawn. Blood cultures returned positive for staph aureus. The patient was called a couple times in succession, however the patient refused to return to the hospital for evaluation and admission. The patient returned today as the persuasion of his wife. The Bactrim to help to reduce the swelling and the erythema, although the index finger is still quite swollen. He also admits to having some chills, although no frank fevers. The emergency department he began having abdominal pain that started low in the pelvis and ended in the left upper quadrant. Pain medicine helps with the abdominal pain.    Review of Systems:   Pt complains of chills  Pt denies any fevers, shortness of breath, chest pain, cough, nausea, vomiting, diarrhea.  Review of systems are otherwise negative  Past Medical History  Diagnosis Date  . Sleep apnea     Stop Bang score of 4   Past Surgical History  Procedure Laterality Date  . Back surgery  x2  . Cholecystectomy N/A 12/07/2013    Procedure: LAPAROSCOPIC CHOLECYSTECTOMY;  Surgeon: Jamesetta So, MD;  Location: [facility];  Service: General;  Laterality: N/A;   Social History:  reports that he has been smoking Cigarettes.  He has a 20 pack-year smoking history. He does not have any smokeless tobacco history on file. He reports that he drinks alcohol. He reports that he uses illicit drugs (Marijuana). Patient lives  at home & is able to participate in activities of daily living without assistance  Allergies  Allergen Reactions  . Toradol [Ketorolac Tromethamine] Rash    History reviewed. No pertinent family history.    Prior to Admission medications   Medication Sig Start Date End Date Taking? Authorizing Provider  ALPRAZolam Duanne Moron) 1 MG tablet Take 1 mg by mouth 3 (three) times daily as needed for anxiety.   Yes Historical Provider, MD  pregabalin (LYRICA) 225 MG capsule Take 225 mg by mouth 2 (two) times daily.   Yes Historical Provider, MD  sulfamethoxazole-trimethoprim (SEPTRA DS) 800-160 MG per tablet Take 1 tablet by mouth every 12 (twelve) hours. 03/03/14  Yes [name], MD    Physical Exam: BP 135/86  Pulse 99  Temp(Src) 99.4 F (37.4 C) (Oral)  Resp 20  Ht 6\' 6"  (1.981 m)  Wt 113.399 kg (250 lb)  BMI 28.90 kg/m2  SpO2 100%  General:  Young adult male, awake, alert, oriented x3. No acute pulmonary distress. Eyes: pupils equal, round, reactive to light. Extraocular muscles are intact. Sclerae anicteric. ENT: external auditory canals are patent. Tympanic membranes reflected good cone of light. Mucosal membranes are moist and without lesions. Dentition is poor. Neck: supple and without adenopathy. Cardiovascular: regular rate normal S1-S2 sounds. No murmurs auscultated. Respiratory: good respiratory effort. Clear to auscultation bilaterally with no wheezes rales or rhonchi. Abdomen: soft, nontender, nondistended with appropriate bowel sounds. Skin: extremities swollen left index finger with erythema. The nail has been avulsed.  There is mild bleeding.  Additionally, there are 2 incisions that were made by the primary care physician that are not draining Musculoskeletal: Maj. Joints are not erythematous Psychiatric: appropriate Neurologic: intact and without focal deficits are          Labs on Admission:  Basic Metabolic Panel:  Recent Labs Lab 03/03/14 1845 03/06/14 2312    NA 138 132*  K 3.8 3.7  CL 101 97  CO2 25 22  GLUCOSE 96 163*  BUN 13 12  CREATININE 0.95 1.11  CALCIUM 9.5 8.8   Liver Function Tests:  Recent Labs Lab 03/06/14 2312  AST 67*  ALT 60*  ALKPHOS 189*  BILITOT 1.1  PROT 7.8  ALBUMIN 3.6    Recent Labs Lab 03/06/14 2312  LIPASE 649*   No results found for this basename: AMMONIA,  in the last 168 hours CBC:  Recent Labs Lab 03/03/14 1845 03/06/14 2219  WBC 12.6* 8.8  NEUTROABS 9.5* 5.9  HGB 15.4 13.3  HCT 43.9 36.9*  MCV 91.6 89.1  PLT 206 207   Cardiac Enzymes: No results found for this basename: CKTOTAL, CKMB, CKMBINDEX, TROPONINI,  in the last 168 hours  BNP (last 3 results) No results found for this basename: PROBNP,  in the last 8760 hours CBG: No results found for this basename: GLUCAP,  in the last 168 hours  Radiological Exams on Admission: Dg Hand Complete Left  03/06/2014   CLINICAL DATA:  Pain, swelling and erythema at a wound between the index and middle fingers of the left hand. Purulent discharge.  EXAM: LEFT HAND - COMPLETE 3+ VIEW  COMPARISON:  None.  FINDINGS: The known soft tissue wound is not well characterized. There is diffuse soft tissue swelling about the second and third metacarpophalangeal joints, and along the second finger. No radiopaque foreign bodies are seen. There is no evidence of osseous erosion at this time.  Visualized joint spaces are preserved. The carpal rows appear grossly intact, and demonstrate normal alignment. 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  IMPRESSION: 1. Known soft tissue wound is not well characterized. No radiopaque foreign bodies seen. Diffuse soft tissue swelling about the second and third metacarpophalangeal joints, and along the second finger. 2. No evidence of osseous erosion at this time. 3. For chronic amputation involving the distal tuft of the second digit and much of the third  distal phalanx.   Electronically Signed   By: Garald Balding M.D.   On: 03/06/2014 22:18    Assessment/Plan Present on Admission:  . Cellulitis of left index finger . Bacteremia due to Staphylococcus aureus . Hyponatremia . Pancreatitis, acute  #1 cellulitis left index finger with bacteremia due to staph aureus Admit Continue vancomycin Wait for sensitivities Repeat CBC in the morning  #2 pancreatitis At the due to Bactrim, which is a class IB drug for pancreatitis. Will stop Bactrim and continue vancomycin Fluids 200 mL/hr N.p.o. Repeat lipase in the morning  #3 Hyponatremia Should correct with fluids. Repeat in a.m.  Consultants: None  Code Status: full  Family Communication: wife   Disposition Plan: return home when stable  Time spent: 34 minutes  [place], [place] [facility] Pager [phone]  **Disclaimer: This note may have been dictated with voice recognition software. Similar sounding words can inadvertently be transcribed and this note may contain transcription errors which may not have been corrected upon publication of note.**

## 2014-03-07 NOTE — ED Provider Notes (Signed)
CSN: [id number]     Arrival date & time 03/07/14  1444 History   First MD Initiated Contact with Patient 03/07/14 1452     Chief Complaint  Patient presents with  . Finger Injury     (Consider location/radiation/quality/duration/timing/severity/associated sxs/prior Treatment) HPI Comments: Patient is a 33 yo M PMHx significant for tobacco abuse, sleep apnea presenting to the ED after signing out AMA at [facility] for positive blood cultures for staph aureus and worsening infection in his left finger. He was in the hospital last evening and given IV antibiotics, patient was to be transported here to [facility] for Dr. Caralyn Guile to take the patient to the OR for surgery. Patient had lunch at noon today. Denies fevers.    Past Medical History  Diagnosis Date  . Sleep apnea     Stop Bang score of 4   Past Surgical History  Procedure Laterality Date  . Back surgery  x2  . Cholecystectomy N/A 12/07/2013    Procedure: LAPAROSCOPIC CHOLECYSTECTOMY;  Surgeon: Jamesetta So, MD;  Location: [facility];  Service: General;  Laterality: N/A;   History reviewed. No pertinent family history. History  Substance Use Topics  . Smoking status: Current Every Day Smoker -- 1.00 packs/day for 20 years    Types: Cigarettes  . Smokeless tobacco: Not on file  . Alcohol Use: Yes     Comment: social    Review of Systems  Skin: Positive for color change and wound.  All other systems reviewed and are negative.     Allergies  Bactrim; Sulfites; and Toradol  Home Medications   Prior to Admission medications   Medication Sig Start Date End Date Taking? Authorizing Provider  ALPRAZolam Duanne Moron) 1 MG tablet Take 1 mg by mouth 3 (three) times daily as needed for anxiety.    Historical Provider, MD  pregabalin (LYRICA) 225 MG capsule Take 225 mg by mouth 2 (two) times daily.    Historical Provider, MD  sulfamethoxazole-trimethoprim (SEPTRA DS) 800-160 MG per tablet Take 1 tablet by mouth every 12 (twelve)  hours. 03/03/14   [name], MD   BP 117/58  Pulse 55  Temp(Src) 98.1 F (36.7 C) (Oral)  Resp 18  Ht 6\' 6"  (1.981 m)  Wt 250 lb (113.399 kg)  BMI 28.90 kg/m2  SpO2 96% Physical Exam  Nursing note and vitals reviewed. Constitutional: He is oriented to person, place, and time. He appears well-developed and well-nourished. No distress.  HENT:  Head: Normocephalic and atraumatic.  Right Ear: External ear normal.  Left Ear: External ear normal.  Nose: Nose normal.  Mouth/Throat: Oropharynx is clear and moist.  Eyes: Conjunctivae are normal.  Neck: Normal range of motion. Neck supple.  Cardiovascular: Normal rate, regular rhythm and normal heart sounds.   Pulmonary/Chest: Effort normal and breath sounds normal.  Abdominal: Soft.  Musculoskeletal:       Left hand: He exhibits decreased range of motion, tenderness and swelling.       Hands: Neurological: He is alert and oriented to person, place, and time.  Skin: Skin is warm and dry. He is not diaphoretic.  Psychiatric: He has a normal mood and affect.    ED Course  Procedures (including critical care time) Medications  morphine 2 MG/ML injection 2 mg (not administered)  ondansetron (ZOFRAN) tablet 4 mg (not administered)    Or  ondansetron (ZOFRAN) injection 4 mg (not administered)    Labs Review Labs Reviewed - No data to display  Imaging  Review Dg Hand Complete Left  03/06/2014   CLINICAL DATA:  Pain, swelling and erythema at a wound between the index and middle fingers of the left hand. Purulent discharge.  EXAM: LEFT HAND - COMPLETE 3+ VIEW  COMPARISON:  None.  FINDINGS: The known soft tissue wound is not well characterized. There is diffuse soft tissue swelling about the second and third metacarpophalangeal joints, and along the second finger. No radiopaque foreign bodies are seen. There is no evidence of osseous erosion at this time.  Visualized joint spaces are preserved. The carpal rows appear grossly intact, and  demonstrate normal alignment. 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  IMPRESSION: 1. Known soft tissue wound is not well characterized. No radiopaque foreign bodies seen. Diffuse soft tissue swelling about the second and third metacarpophalangeal joints, and along the second finger. 2. No evidence of osseous erosion at this time. 3. 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   Electronically Signed   By: Garald Balding M.D.   On: 03/06/2014 22:18     EKG Interpretation None      3:14 PM Dr. Ree Kida in patient's room for admission. Admit orders in place.   MDM   Final diagnoses:  Anxiety  Bacteremia due to Staphylococcus aureus  Cellulitis of left index finger  Neuropathy    Dr. Caralyn Guile is aware patient has not been NPO, surgery will be delayed. [facility] for admission. Patient will be admitted for further treatment. Patient d/w with Dr. Zenia Resides, agrees with plan.      Harlow Mares, PA-C 03/07/14 2003

## 2014-03-07 NOTE — ED Notes (Signed)
Pt given ice chips. Pt also requesting finger to be lanced. EDP aware and he said he has no plans to lance pts finger.

## 2014-03-07 NOTE — Progress Notes (Signed)
Called on-call provider at the request of the patient and his wife. Patient and wife are angry that they did not speak with MD following I&D. Answering service reached and notified. Will continue to monitor.

## 2014-03-07 NOTE — Discharge Summary (Signed)
Physician Discharge Summary  Benjamin Carter:[id number] DOB: 14-Apr-1981 DOA: 03/06/2014  PCP: Neale Burly, MD  Admit date: 03/06/2014 Discharge date: 03/07/2014  Time spent: 30 minutes  Recommendations for Outpatient Follow-up:  1. Patient left AMA from [facility]. Given severity of illness, positive blood cultures growing MRSA and the likelihood of requiring amputation of left index finger, he was strongly encouraged to be transported by ambulance, for a direct admission to [facility]. I explained the seriousness of his current condition. The potential risks including a delay in care were explained to patient and family members who were present at bedside, as they verbalized understanding of this. I notified the emergency department at [facility], spoke with Dr Melanee Left.   Discharge Diagnoses:  Active Problems:   Cellulitis of left index finger   Bacteremia due to Staphylococcus aureus   Hyponatremia   Pancreatitis, acute   Discharge Condition:   Diet recommendation:   [facility] Weights   03/06/14 2044 03/07/14 0338  Weight: 113.399 kg (250 lb) 120.2 kg (264 lb 15.9 oz)    History of present illness:  Benjamin Carter is a 33 y.o. 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 He was seen in the emergency department 3 days ago due to swelling and erythema of his left index finger. He was placed on Bactrim because he did not want to be admitted.blood cultures have been drawn. Blood cultures returned positive for staph aureus. The patient was called a couple times in succession, however the patient refused to return to the hospital for evaluation and admission. The patient returned today as the persuasion of his wife. The Bactrim to help to reduce the swelling and the erythema, although the index finger is still quite swollen. He also admits to having some chills, although no  frank fevers. The emergency department he began having abdominal pain that started low in the pelvis and ended in the left upper quadrant. Pain medicine helps with the abdominal pain.   Hospital Course:  Patient is a 33 year old male with a history of partial amputation to the second and third digits of the left hand following accident with a saw. He was seen in the emergency room at [facility] on 03/03/2014 complaining of left index finger erythema and pain. During that ED visit it was recommended that he be admitted to receive IV antimicrobial therapy. Unfortunately patient declined admission and was discharged from the emergency department on Bactrim therapy. Blood cultures were obtained during that ED visit came back positive, growing MRSA. Patient was notified of positive blood cultures and asked to come back to the hospital. He came in overnight complaining of worsening left index finger pain and swelling. I asked Dr.Jenkins of surgery to take a look at his finger, who felt patient needed further evaluation by a hand surgeon. I spoke to Dr Apolonio Schneiders of hand surgery, who strongly recommended patient to be transferred to [facility] as he will likely require partial amputation. Given severity of illness, positive blood cultures, the need for prompt surgical intervention, I strongly recommended that this be handled as a direct admission with ambulance transport to the receiving hospital. Patient and family members declining ambulance transport, stating they would drive him to receive facility. I explained the potential consequences of this including the possibility of delay of care. They verbalized their understanding of this and signed [facility] paperwork.  I notified Dr. Apolonio Schneiders at the time he left hospital.   Consultations:  Dr Arnoldo Morale, General Surgery  Discharge Exam: Filed Vitals:   03/07/14 0727  BP: 96/50  Pulse: 71  Temp: 98.4 F (36.9 C)  Resp: 20    General: Patient is in no  acute distress, he is awake and alert Cardiovascular: Regular rate and rhythm, normal S1S2 Respiratory: Cleat to auscultation bilaterally Skin: There is significant circumferential swelling to the left index finger, with localized warmth and erythema  Discharge Instructions You were cared for by a hospitalist during your hospital stay. If you have any questions about your discharge medications or the care you received while you were in the hospital after you are discharged, you can call the unit and asked to speak with the hospitalist on call if the hospitalist that took care of you is not available. Once you are discharged, your primary care physician will handle any further medical issues. Please note that NO REFILLS for any discharge medications will be authorized once you are discharged, as it is imperative that you return to your primary care physician (or establish a relationship with a primary care physician if you do not have one) for your aftercare needs so that they can reassess your need for medications and monitor your lab values.     Medication List    ASK your doctor about these medications       ALPRAZolam 1 MG tablet  Commonly known as:  XANAX  Take 1 mg by mouth 3 (three) times daily as needed for anxiety.     pregabalin 225 MG capsule  Commonly known as:  LYRICA  Take 225 mg by mouth 2 (two) times daily.     sulfamethoxazole-trimethoprim 800-160 MG per tablet  Commonly known as:  SEPTRA DS  Take 1 tablet by mouth every 12 (twelve) hours.       Allergies  Allergen Reactions  . Sulfites     Caused elevated liver enzymes  . Toradol [Ketorolac Tromethamine] Rash      The results of significant diagnostics from this hospitalization (including imaging, microbiology, ancillary and laboratory) are listed below for reference.    Significant Diagnostic Studies: Dg Hand Complete Left  03/06/2014   CLINICAL DATA:  Pain, swelling and erythema at a wound between the index  and middle fingers of the left hand. Purulent discharge.  EXAM: LEFT HAND - COMPLETE 3+ VIEW  COMPARISON:  None.  FINDINGS: The known soft tissue wound is not well characterized. There is diffuse soft tissue swelling about the second and third metacarpophalangeal joints, and along the second finger. No radiopaque foreign bodies are seen. There is no evidence of osseous erosion at this time.  Visualized joint spaces are preserved. The carpal rows appear grossly intact, and demonstrate normal alignment. 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  IMPRESSION: 1. Known soft tissue wound is not well characterized. No radiopaque foreign bodies seen. Diffuse soft tissue swelling about the second and third metacarpophalangeal joints, and along the second finger. 2. No evidence of osseous erosion at this time. 3. 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   Electronically Signed   By: Garald Balding M.D.   On: 03/06/2014 22:18    Microbiology: Recent Results (from the past 240 hour(s))  CULTURE, BLOOD (ROUTINE X 2)     Status: None  Collection Time    03/03/14  7:15 PM      Result Value Ref Range Status   Specimen Description BLOOD LEFT ARM   Final   Special Requests     Final   Value: BOTTLES DRAWN AEROBIC AND ANAEROBIC AEB=13CC ANA=12CC   Culture  Setup Time     Final   Value: 03/04/2014 20:33     Performed at [facility]   Culture     Final   Value: METHICILLIN RESISTANT STAPHYLOCOCCUS AUREUS     Note: SUSCEPTIBILITIES PERFORMED ON PREVIOUS CULTURE WITHIN THE LAST 5 DAYS.     Note: Gram Stain Report Called to,Read Back By and Verified With: GAMMONS S (FLOW MANAGER) AT 1318 ON 03/04/2014 BY Tyrone Schimke M     Performed at [facility]   Report Status 03/07/2014 FINAL   Final  CULTURE, BLOOD (ROUTINE X 2)     Status: None   Collection Time    03/03/14   7:18 PM      Result Value Ref Range Status   Specimen Description BLOOD RIGHT ARM   Final   Special Requests     Final   Value: BOTTLES DRAWN AEROBIC AND ANAEROBIC AEB=10CC ANA=12CC   Culture  Setup Time     Final   Value: 03/04/2014 22:46     Performed at [facility]   Culture     Final   Value: METHICILLIN RESISTANT STAPHYLOCOCCUS AUREUS     Note: This organism DOES NOT demonstrate inducible Clindamycin resistance in vitro.     Note: Gram Stain Report Called to,Read Back By and Verified With: GAMMONS S. AT 1318 ON 03/04/14 BY BAUGHAM M. Performed at [facility], READ BACK BY AND VERIFIED WITH: [place] 03/07/14 @ 9:19PM BY RUSCOE A.     Performed at [facility]   Report Status 03/07/2014 FINAL   Final   Organism ID, Bacteria METHICILLIN RESISTANT STAPHYLOCOCCUS AUREUS   Final     Labs: Basic Metabolic Panel:  Recent Labs Lab 03/03/14 1845 03/06/14 2312 03/07/14 0637  NA 138 132* 134*  K 3.8 3.7 4.1  CL 101 97 100  CO2 25 22 25   GLUCOSE 96 163* 132*  BUN 13 12 9   CREATININE 0.95 1.11 1.06  CALCIUM 9.5 8.8 8.3*   Liver Function Tests:  Recent Labs Lab 03/06/14 2312 03/07/14 0637  AST 67* 136*  ALT 60* 100*  ALKPHOS 189* 246*  BILITOT 1.1 1.5*  PROT 7.8 6.9  ALBUMIN 3.6 3.3*    Recent Labs Lab 03/06/14 2312 03/07/14 0918  LIPASE 649* 279*   No results found for this basename: AMMONIA,  in the last 168 hours CBC:  Recent Labs Lab 03/03/14 1845 03/06/14 2219 03/07/14 0637  WBC 12.6* 8.8 6.7  NEUTROABS 9.5* 5.9  --   HGB 15.4 13.3 12.6*  HCT 43.9 36.9* 35.7*  MCV 91.6 89.1 90.2  PLT 206 207 176   Cardiac Enzymes: No results found for this basename: CKTOTAL, CKMB, CKMBINDEX, TROPONINI,  in the last 168 hours BNP: BNP (last 3 results) No results found for this basename: PROBNP,  in the last 8760 hours CBG: No results found for this basename: GLUCAP,  in the last 168  hours     Signed:  Kelvin Cellar  [facility] 03/07/2014, 12:47 PM

## 2014-03-07 NOTE — Progress Notes (Signed)
Patient left AMA. All risk explained, patient understands. Dr. Coralyn Pear. aware.

## 2014-03-07 NOTE — Progress Notes (Signed)
Patient with critical lab wound culture positive for staph and MRSA. Dr. Coralyn Pear notified.

## 2014-03-07 NOTE — Progress Notes (Signed)
ARRIVED TO ROOM 5W05 FROM ED, A/OX4, DENIES NAUSEA AT THIS TIME, C/O RIGHT HAND PAIN 7/10/ WIFE AT BEDSIDE, INSTRUCTED ON CALL SYSTEM AND ROOM SURROUNDINGS.

## 2014-03-07 NOTE — Progress Notes (Signed)
VERY BAD INFECTION TO FINGER WILL REQUIRE REPEAT I/D OF FINGER ON Wednesday CONTINUE INPATIENT CARE IV ABX WILL LIKELY PERFORM AMPUTATION OF INDEX FINGER THROUGH LEVEL OF DIP JOINT ON Wednesday. WILL DISCUSS WITH FAMILY AND PATIENT

## 2014-03-07 NOTE — Anesthesia Postprocedure Evaluation (Signed)
  Anesthesia Post-op Note  Patient: Benjamin Carter  Procedure(s) Performed: Procedure(s): IRRIGATION AND DEBRIDEMENT INDEX FINGER (Left)  Patient Location: PACU  Anesthesia Type:General  Level of Consciousness: awake and alert   Airway and Oxygen Therapy: Patient Spontanous Breathing  Post-op Pain: none  Post-op Assessment: Post-op Vital signs reviewed, Patient's Cardiovascular Status Stable and Respiratory Function Stable  Post-op Vital Signs: Reviewed  Filed Vitals:   03/07/14 1930  BP: 108/55  Pulse: 51  Temp: 36.7 C  Resp: 15    Complications: No apparent anesthesia complications

## 2014-03-07 NOTE — H&P (Addendum)
[facility] History and Physical  Benjamin Carter UXN:[id number] DOB: 10-14-1980 DOA: 03/07/2014  Referring physician:  PCP: Neale Burly, MD  Specialists:   Chief Complaint: Finger Infection  HPI: Benjamin Carter is a 33 y.o. 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 Patient was seen this past Wednesday, March 03, 2014 at [facility], due to swelling and erythema of the left index finger. Patient was started on Bactrim and did not want to be admitted. Patient did have blood cultures drawn from that ER visit, which were positive for MRSA. Patient was called several times, and refused to come to the hospital for evaluation and admission. Patient did return this morning at the request of his wife, he was admitted to [facility].  During this admission, patient needed to be seen by a orthopedic/hand surgeon, therefore would need to be transferred to [facility]. Patient opted to leave [facility] as he did not have the money for EMS transport to [place]. Dr. Caralyn Guile, hand surgeon was called and patient will be operated on later today.   Review of Systems:  Constitutional: Denies fever, chills, diaphoresis, appetite change and fatigue.  HEENT: Denies photophobia, eye pain, redness, hearing loss, ear pain, congestion, sore throat, rhinorrhea, sneezing, mouth sores, trouble swallowing, neck pain, neck stiffness and tinnitus.   Respiratory: Denies SOB, DOE, cough, chest tightness,  and wheezing.   Cardiovascular: Denies chest pain, palpitations and leg swelling.  Gastrointestinal: Denies nausea, vomiting, abdominal pain, diarrhea, constipation, blood in stool and abdominal distention.  Genitourinary: Denies dysuria, urgency, frequency, hematuria, flank pain and difficulty urinating.  Musculoskeletal: Left index  finger swelling.  Skin: Complains of finger infection and swelling.  Neurological: Denies dizziness, seizures, syncope, weakness, light-headedness, numbness and headaches.  Hematological: Denies adenopathy. Easy bruising, personal or family bleeding history  Psychiatric/Behavioral: Has history of anxiety.  Past Medical History  Diagnosis Date  . Sleep apnea     Stop Bang score of 4   Past Surgical History  Procedure Laterality Date  . Back surgery  x2  . Cholecystectomy N/A 12/07/2013    Procedure: LAPAROSCOPIC CHOLECYSTECTOMY;  Surgeon: Jamesetta So, MD;  Location: [facility]S;  Service: General;  Laterality: N/A;   Social History:  reports that he has been smoking Cigarettes.  He has a 20 pack-year smoking history. He does not have any smokeless tobacco history on file. He reports that he drinks alcohol. He reports that he does not use illicit drugs.   Allergies  Allergen Reactions  . Bactrim [Sulfamethoxazole-Tmp Ds]     Caused pancreatitis  . Sulfites     Caused elevated liver enzymes  . Toradol [Ketorolac Tromethamine] Rash    History reviewed. No pertinent family history.   Prior to Admission medications   Medication Sig Start Date End Date Taking? Authorizing Provider  ALPRAZolam Duanne Moron) 1 MG tablet Take 1 mg by mouth 3 (three) times daily as needed for anxiety.    Historical Provider, MD  pregabalin (LYRICA) 225 MG capsule Take 225 mg by mouth 2 (two) times daily.    Historical Provider, MD  sulfamethoxazole-trimethoprim (SEPTRA DS) 800-160 MG per tablet Take 1 tablet by mouth every 12 (twelve) hours. 03/03/14   [name], MD   Physical Exam: Filed Vitals:   03/07/14 1457  BP: 117/58  Pulse: 55  Temp: 98.1 F (36.7 C)  Resp: 18     General: Well developed, well nourished, NAD, appears stated age  HEENT: NCAT, PERRLA, EOMI, Anicteic Sclera, mucous membranes moist.   Neck: Supple, no JVD, no masses  Cardiovascular: S1 S2 auscultated, no rubs, murmurs or  gallops. Regular rate and rhythm.  Respiratory: Clear to auscultation bilaterally with equal chest rise  Abdomen: Soft, nontender, nondistended, + bowel sounds  Extremities: warm dry without cyanosis clubbing or edema of the lower extremities.  Left index finger shows significant erythema, warmth, edema with shoulder drainage  Neuro: AAOx3, cranial nerves grossly intact. Strength 5/5 in patient's upper and lower extremities bilaterally  Skin: Without rashes exudates or nodules  Psych: Normal affect and demeanor with intact judgement and insight  Labs on Admission:  Basic Metabolic Panel:  Recent Labs Lab 03/03/14 1845 03/06/14 2312 03/07/14 0637  NA 138 132* 134*  K 3.8 3.7 4.1  CL 101 97 100  CO2 25 22 25   GLUCOSE 96 163* 132*  BUN 13 12 9   CREATININE 0.95 1.11 1.06  CALCIUM 9.5 8.8 8.3*   Liver Function Tests:  Recent Labs Lab 03/06/14 2312 03/07/14 0637  AST 67* 136*  ALT 60* 100*  ALKPHOS 189* 246*  BILITOT 1.1 1.5*  PROT 7.8 6.9  ALBUMIN 3.6 3.3*    Recent Labs Lab 03/06/14 2312 03/07/14 0918  LIPASE 649* 279*   No results found for this basename: AMMONIA,  in the last 168 hours CBC:  Recent Labs Lab 03/03/14 1845 03/06/14 2219 03/07/14 0637  WBC 12.6* 8.8 6.7  NEUTROABS 9.5* 5.9  --   HGB 15.4 13.3 12.6*  HCT 43.9 36.9* 35.7*  MCV 91.6 89.1 90.2  PLT 206 207 176   Cardiac Enzymes: No results found for this basename: CKTOTAL, CKMB, CKMBINDEX, TROPONINI,  in the last 168 hours  BNP (last 3 results) No results found for this basename: PROBNP,  in the last 8760 hours CBG: No results found for this basename: GLUCAP,  in the last 168 hours  Radiological Exams on Admission: Dg Hand Complete Left  03/06/2014   CLINICAL DATA:  Pain, swelling and erythema at a wound between the index and middle fingers of the left hand. Purulent discharge.  EXAM: LEFT HAND - COMPLETE 3+ VIEW  COMPARISON:  None.  FINDINGS: The known soft tissue wound is not well  characterized. There is diffuse soft tissue swelling about the second and third metacarpophalangeal joints, and along the second finger. No radiopaque foreign bodies are seen. There is no evidence of osseous erosion at this time.  Visualized joint spaces are preserved. The carpal rows appear grossly intact, and demonstrate normal alignment. 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  IMPRESSION: 1. Known soft tissue wound is not well characterized. No radiopaque foreign bodies seen. Diffuse soft tissue swelling about the second and third metacarpophalangeal joints, and along the second finger. 2. No evidence of osseous erosion at this time. 3. 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   Electronically Signed   By: Garald Balding M.D.   On: 03/06/2014 22:18    EKG: None  Assessment/Plan  MRSA Bacteremia secondary to finger infection -Patient will be admitted to [place] -Patient's blood cultures from 03/03/2014 were positive for MRSA -Will place patient on vancomycin with pharmacy to follow -Will consult infectious disease -Will obtain an  echocardiogram, if echocardiogram is negative patient will likely need a TEE -Orthopedic/Hand surgery has been consulted, Dr. Caralyn Guile -Will place patient on morphine for pain control  Left Index Finger Infection -treatment and plan as stated above  Anxiety -Continue Xanax after surgery.  Neuropathy -Continue Lyrica after surgery.  DVT prophylaxis: SCDs  Code Status: Full  Condition: Guarded  Family Communication: Wife at bedside. Admission, patients condition and plan of care including tests being ordered have been discussed with the patient and wife who indicate understanding and agree with the plan and Code Status.  Disposition Plan: Admitted   Time spent: 60 minutes  ,  D.O. [facility] Pager [phone]  If 7PM-7AM, please contact night-coverage [email or web address] Password [facility] 03/07/2014, 3:19 PM

## 2014-03-08 ENCOUNTER — Encounter (HOSPITAL_COMMUNITY): Payer: Self-pay | Admitting: Orthopedic Surgery

## 2014-03-08 DIAGNOSIS — R7881 Bacteremia: Secondary | ICD-10-CM

## 2014-03-08 DIAGNOSIS — A4901 Methicillin susceptible Staphylococcus aureus infection, unspecified site: Secondary | ICD-10-CM

## 2014-03-08 DIAGNOSIS — I369 Nonrheumatic tricuspid valve disorder, unspecified: Secondary | ICD-10-CM

## 2014-03-08 DIAGNOSIS — L02519 Cutaneous abscess of unspecified hand: Secondary | ICD-10-CM

## 2014-03-08 DIAGNOSIS — L03019 Cellulitis of unspecified finger: Secondary | ICD-10-CM

## 2014-03-08 LAB — COMPREHENSIVE METABOLIC PANEL
ALT: 113 U/L — ABNORMAL HIGH (ref 0–53)
ANION GAP: 11 (ref 5–15)
AST: 106 U/L — ABNORMAL HIGH (ref 0–37)
Albumin: 2.8 g/dL — ABNORMAL LOW (ref 3.5–5.2)
Alkaline Phosphatase: 307 U/L — ABNORMAL HIGH (ref 39–117)
BUN: 7 mg/dL (ref 6–23)
CHLORIDE: 103 meq/L (ref 96–112)
CO2: 23 meq/L (ref 19–32)
CREATININE: 0.96 mg/dL (ref 0.50–1.35)
Calcium: 8.3 mg/dL — ABNORMAL LOW (ref 8.4–10.5)
GLUCOSE: 106 mg/dL — AB (ref 70–99)
Potassium: 4.2 mEq/L (ref 3.7–5.3)
Sodium: 137 mEq/L (ref 137–147)
Total Bilirubin: 0.9 mg/dL (ref 0.3–1.2)
Total Protein: 6.4 g/dL (ref 6.0–8.3)

## 2014-03-08 LAB — CBC
HCT: 33.1 % — ABNORMAL LOW (ref 39.0–52.0)
Hemoglobin: 11.3 g/dL — ABNORMAL LOW (ref 13.0–17.0)
MCH: 31.1 pg (ref 26.0–34.0)
MCHC: 34.1 g/dL (ref 30.0–36.0)
MCV: 91.2 fL (ref 78.0–100.0)
PLATELETS: 172 10*3/uL (ref 150–400)
RBC: 3.63 MIL/uL — ABNORMAL LOW (ref 4.22–5.81)
RDW: 12.7 % (ref 11.5–15.5)
WBC: 6.4 10*3/uL (ref 4.0–10.5)

## 2014-03-08 MED ORDER — VANCOMYCIN HCL IN DEXTROSE 1-5 GM/200ML-% IV SOLN
1000.0000 mg | Freq: Three times a day (TID) | INTRAVENOUS | Status: DC
  Filled 2014-03-08: qty 200

## 2014-03-08 MED ORDER — POLYETHYLENE GLYCOL 3350 17 G PO PACK
17.0000 g | PACK | Freq: Every day | ORAL | Status: DC
  Administered 2014-03-08: 17 g via ORAL
  Filled 2014-03-08 (×4): qty 1

## 2014-03-08 MED ORDER — OXYCODONE HCL 5 MG PO TABS
5.0000 mg | ORAL_TABLET | ORAL | Status: DC | PRN
  Administered 2014-03-11: 5 mg via ORAL
  Filled 2014-03-08: qty 1

## 2014-03-08 NOTE — Progress Notes (Signed)
PATIENT DETAILS Name: Benjamin Carter Age: 33 y.o. Sex: male Date of Birth: Sep 21, 1980 Admit Date: 03/07/2014 Admitting Physician Linna Hoff, MD XFG:[id number],JIRC A, MD  Subjective: Patient has no new physical complaints.   He is angry that he cannot have surgery sooner than 8/19 and wants to be discharged due to father-in-law's scheduled surgery Carter 8/19. Patient was informed of the severity of his situation and understands the reasons for IV abx and waiting until 8/19 for procedure. He knows he should not expect to be discharged in the next few days.  Assessment/Plan:  MRSA Bacteremia -secondary to left index finger infection -Patient was seen Carter 8/12 at [facility] for finger infection,blood Cultures 8/12 positive for Methicillin Resistant Staphylococcus Aureus. Unfortunately, patient refused admission and was sent home Carter Bactrim, he was later notified Carter blood culture results. Admitted to [facility] Carter 8/16, started Carter IV Vancomycin Day 2. Hand surgery consulted. Repeat Blood cultures today, await formal ID eval. Await TTE.   Left index finger infection -as noted above, seen in [facility] ED Carter 8/12 and started Carter Bactrim. Patient refused admission then. After blood cultures came back positive patient was contacted. He was admitted to [facility] Carter 8/16. Hand surgery-Dr Apolonio Schneiders was consulted, underwent Incision and debridement Carter 8/16. Plans are to go back to the OR Carter Wednesday, may require amputation.Continue with Vancomycin day 2.Intraoperative wound cultures negative so far-was already Carter bactrim for a few days as outpatient. -prn narcotics for pain  Anxiety -Home regimen Xanax TID as needed for anxiety   Note-Dr Ghimire explained to patient in great detail that patient has MRSA bacteremia and the serious nature of the finger infection, and potential life threatening/disabling consequences of leaving the hospital before ready for discharge. He was also made aware that future OR  procedures/TEE are scheduled depending Carter many factors and not just patient convenience/desire. He understands.   Disposition: Remain inpatient  DVT Prophylaxis: SCD's  Code Status: Full code   Family Communication No family present  Procedures:  8/19 Left index finger I&D and probable amputation to DIP joint  CONSULTS:  orthopedic surgery   ID  Time spent 40 minutes-which includes 50% of the time with face-to-face with patient/ family and coordinating care related to the above assessment and plan.  MEDICATIONS: Scheduled Meds: . pregabalin  225 mg Oral BID  . vancomycin  1,000 mg Intravenous Q8H   Continuous Infusions: . sodium chloride 75 mL/hr at 03/07/14 1907   PRN Meds:.acetaminophen, acetaminophen, ALPRAZolam, morphine injection, ondansetron (ZOFRAN) IV, ondansetron  Antibiotics: Anti-infectives   Start     Dose/Rate Route Frequency Ordered Stop   03/08/14 0600  vancomycin (VANCOCIN) 1,500 mg in sodium chloride 0.9 % 500 mL IVPB     1,500 mg 250 mL/hr over 120 Minutes Intravenous Carter call to O.R. 03/07/14 1635 03/07/14 1742   03/07/14 1800  vancomycin (VANCOCIN) IVPB 1000 mg/200 mL premix     1,000 mg 200 mL/hr over 60 Minutes Intravenous Every 8 hours 03/07/14 1656       PHYSICAL EXAM: Vital signs in last 24 hours: Filed Vitals:   03/07/14 1918 03/07/14 1930 03/07/14 2043 03/08/14 0510  BP: 102/53 108/55 106/60 97/60  Pulse: 52 51 61 63  Temp:  98 F (36.7 C) 98.4 F (36.9 C) 98.8 F (37.1 C)  TempSrc:   Oral Oral  Resp: 17 15 18 18   Height:      Weight:  SpO2: 95% 96% 100% 98%    Weight change:  Filed Weights   03/07/14 1457 03/07/14 1631  Weight: 113.399 kg (250 lb) 113.399 kg (250 lb)   Body mass index is 28.9 kg/(m^2).   Gen Exam: Awake and alert with clear speech, sitting in bedside recliner, frustrated and in a bad mood. Neck: Supple, No JVD.   Chest: B/L Clear.   CVS: S1 S2 Regular, no murmurs.  Abdomen: soft, BS +, non  tender, non distended.  Extremities: no edema, lower extremities warm to touch. Neurologic: Non Focal.   Skin: No Rash.   Wounds: Left hand and forearm bandaged and in an ace wrap.    Intake/Output from previous day:  Intake/Output Summary (Last 24 hours) at 03/08/14 0934 Last data filed at 03/07/14 1818  Gross per 24 hour  Intake    600 ml  Output      0 ml  Net    600 ml   LAB RESULTS: CBC  Recent Labs Lab 03/03/14 1845 03/06/14 2219 03/07/14 0637 03/08/14 0525  WBC 12.6* 8.8 6.7 6.4  HGB 15.4 13.3 12.6* 11.3*  HCT 43.9 36.9* 35.7* 33.1*  PLT 206 207 176 172  MCV 91.6 89.1 90.2 91.2  MCH 32.2 32.1 31.8 31.1  MCHC 35.1 36.0 35.3 34.1  RDW 12.4 12.4 12.4 12.7  LYMPHSABS 1.9 2.0  --   --   MONOABS 1.1* 0.8  --   --   EOSABS 0.1 0.1  --   --   BASOSABS 0.0 0.0  --   --    Chemistries   Recent Labs Lab 03/03/14 1845 03/06/14 2312 03/07/14 0637 03/08/14 0525  NA 138 132* 134* 137  K 3.8 3.7 4.1 4.2  CL 101 97 100 103  CO2 25 22 25 23   GLUCOSE 96 163* 132* 106*  BUN 13 12 9 7   CREATININE 0.95 1.11 1.06 0.96  CALCIUM 9.5 8.8 8.3* 8.3*   GFR Estimated Creatinine Clearance: 155.1 ml/min (by C-G formula based Carter Cr of 0.96).   Recent Labs  03/06/14 2312 03/07/14 0918  LIPASE 649* 279*    MICROBIOLOGY: Recent Results (from the past 240 hour(s))  CULTURE, BLOOD (ROUTINE X 2)     Status: None   Collection Time    03/03/14  7:15 PM      Result Value Ref Range Status   Specimen Description BLOOD LEFT ARM   Final   Special Requests     Final   Value: BOTTLES DRAWN AEROBIC AND ANAEROBIC AEB=13CC ANA=12CC   Culture  Setup Time     Final   Value: 03/04/2014 20:33     Performed at [facility]   Culture     Final   Value: METHICILLIN RESISTANT STAPHYLOCOCCUS AUREUS     Note: SUSCEPTIBILITIES PERFORMED Carter PREVIOUS CULTURE WITHIN THE LAST 5 DAYS.     Note: Gram Stain Report Called to,Read Back By and Verified With: GAMMONS S (FLOW MANAGER) AT 1318 Carter  03/04/2014 BY Tyrone Schimke M     Performed at [facility]   Report Status 03/07/2014 FINAL   Final  CULTURE, BLOOD (ROUTINE X 2)     Status: None   Collection Time    03/03/14  7:18 PM      Result Value Ref Range Status   Specimen Description BLOOD RIGHT ARM   Final   Special Requests     Final   Value: BOTTLES DRAWN AEROBIC AND ANAEROBIC AEB=10CC ANA=12CC   Culture  Setup Time     Final   Value: 03/04/2014 22:46     Performed at [facility]   Culture     Final   Value: METHICILLIN RESISTANT STAPHYLOCOCCUS AUREUS     Note: This organism DOES NOT demonstrate inducible Clindamycin resistance in vitro.     Note: Gram Stain Report Called to,Read Back By and Verified With: GAMMONS S. AT 1318 Carter 03/04/14 BY BAUGHAM M. Performed at [place], READ BACK BY AND VERIFIED WITH: Amberg 03/07/14 @ 9:19PM BY RUSCOE A.     Performed at [facility]   Report Status 03/07/2014 FINAL   Final   Organism ID, Bacteria METHICILLIN RESISTANT STAPHYLOCOCCUS AUREUS   Final  SURGICAL PCR SCREEN     Status: None   Collection Time    03/07/14  4:45 PM      Result Value Ref Range Status   MRSA, PCR NEGATIVE  NEGATIVE Final   Staphylococcus aureus NEGATIVE  NEGATIVE Final   Comment:            The Xpert SA Assay (FDA     approved for NASAL specimens     in patients over 87 years of age),     is one component of     a comprehensive surveillance     program.  Test performance has     been validated by [employer] for patients greater     than or equal to 41 year old.     It is not intended     to diagnose infection nor to     guide or monitor treatment.  TISSUE CULTURE     Status: None   Collection Time    03/07/14  5:51 PM      Result Value Ref Range Status   Specimen Description TISSUE LEFT FINGER   Final   Special Requests PATIENT Carter FOLLOWING VANCO ZOSYN   Final   Gram Stain PENDING   Incomplete   Culture     Final   Value: NO  GROWTH     Performed at [facility]   Report Status PENDING   Incomplete    RADIOLOGY STUDIES/RESULTS: Dg Hand Complete Left 03/06/2014   CLINICAL DATA:  Pain, swelling and erythema at a wound between the index and middle fingers of the left hand. Purulent discharge.  EXAM: LEFT HAND - COMPLETE 3+ VIEW  COMPARISON:  None.  FINDINGS: The known soft tissue wound is not well characterized. There is diffuse soft tissue swelling about the second and third metacarpophalangeal joints, and along the second finger. No radiopaque foreign bodies are seen. There is no evidence of osseous erosion at this time.  Visualized joint spaces are preserved. The carpal rows appear grossly intact, and demonstrate normal alignment. 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  IMPRESSION: 1. Known soft tissue wound is not well characterized. No radiopaque foreign bodies seen. Diffuse soft tissue swelling about the second and third metacarpophalangeal joints, and along the second finger. 2. No evidence of osseous erosion at this time. 3. 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   Electronically Signed   By: Garald Balding M.D.   Carter: 03/06/2014 22:18    Audelia Acton, PA-S  [facility] Pager:[phone]  If 7PM-7AM, please contact night-coverage  [email or web address] Password [email or web address] 03/08/2014, 9:34 AM   LOS: 1 day   **Disclaimer: This note may have been dictated with voice recognition software. Similar sounding words can inadvertently be transcribed and this note may contain transcription errors which may not have been corrected upon publication of note.**  Attending Patient was seen, examined,treatment plan was discussed with the Physician extender. I have directly reviewed the clinical findings, lab, imaging studies and management of this patient in detail. I have made  the necessary changes to the above noted documentation, and agree with the documentation, as recorded by the Physician extender.  Nena Alexander MD [facility].

## 2014-03-08 NOTE — Care Management Utilization Note (Signed)
UR completed 

## 2014-03-08 NOTE — Op Note (Signed)
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  ATTENDING PHYSICIAN:  Benjamin Hoff IV, MD, who was scrubbed and present for the entire procedure.  ASSISTANT SURGEON:  None.  ANESTHESIA:  General via endotracheal tube.  SURGICAL PROCEDURE: 1. Left index finger debridement and excisional debridement of skin,     subcutaneous tissue, and flexor tendon. 2. Left index finger debridement of bone, osteomyelitis of the distal     phalanx. 3. Left index finger flexor sheath incision and drainage.  SURGICAL INDICATIONS:  Benjamin Carter is a 33 year old gentleman with a persistent infection to the left index finger.  The patient had, had several small I and Ds in the primary care setting, oral antibiotics and continued to progressively getting worse.  He was admitted to [facility] yesterday and transferred to [facility] for definitive treatment. Risks, benefits, and alternatives were discussed in detail with the patient.  Signed informed consent was obtained.  Risks include, but not limited to bleeding, infection; damage to nearby nerves, arteries, or tendons; loss of motion to wrist and digits, incomplete relief of symptoms, and need for further surgical intervention.  DESCRIPTION OF PROCEDURE:  The patient was properly identified in the preoperative holding area and marked with a permanent marker made on the left index finger to indicate the correct operative site.  The patient was then brought back to the operating room, placed supine on the anesthesia  room table where general anesthesia was administered.  The patient tolerated this well.  A well-padded tourniquet was then placed on the left brachium and sealed with a 1000 drape.  Left upper extremity was then prepped and draped in normal sterile fashion.  Time-out was called, correct site was identified, and procedure was then begun. Attention was then turned to the left index finger where the limb was then elevated and tourniquet insufflated.  Bruner incision was then made directly volarly upon median incision through the skin.  Gross purulence was then encountered.  Wound cultures were then taken.  Excisional debridement of the skin and subcutaneous tissue, all the way down to the flexor sheath was then carried out.  This was done sharply with small nice rongeur and curettes.  Following this, after the flexor sheath was then drained __________ of the finger.  After excisional debridement and drainage of the flexor sheath __________ bone curette __________ debridement of the osteomyelitis of the distal phalanx was then carried out with a rongeur.  Bone cultures were then sent to Pathology.  The wound was then thoroughly irrigated.  After thorough irrigation, the small incision was then placed dorsally.  There was some soft tissue swelling, but no gross purulence dorsally.  The wound was then irrigated.  The skin was then closed loosely reapproximated with Prolene sutures.  Adaptic and Xeroform dressing, sterile compressive bandage were then applied.  The patient was then taken to recovery room in good condition.  POSTPROCEDURE PLAN:  The patient was admitted back to the Medicine Service.  __________ return back to the operating room on likely Wednesday for repeat I and D, and we talked to him about whether or not the finger is salvageable, or whether or not __________ if there is at least amputation through the level of the distal interphalangeal joint given the profound  osteomyelitis of the distal phalanx and see whether or not we can maintain some length to the finger.  This is very guarded prognosis for the outcome of this finger.     Benjamin Nakayama, MD     FWO/MEDQ  D:  03/07/2014  T:  03/07/2014  Job:  [id number]

## 2014-03-08 NOTE — Progress Notes (Signed)
Patient refuses SCDs despite education from RN.

## 2014-03-08 NOTE — Consult Note (Signed)
[facility]     Reason for Consult:Staph aureus bacteremia    Referring Physician: Dr. Sloan Leiter  Principal Problem:   Bacteremia Active Problems:   Anxiety   Finger infection   . polyethylene glycol  17 g Oral Daily  . pregabalin  225 mg Oral BID  . vancomycin  1,000 mg Intravenous Q8H    Recommendations: TTE Repeat blood cultures Vancomycin PICC when cultures clear at least 48 hours TEE if TTE negative   Assessment: He has staph aureus bacteremia as a result of finger abscess.     Antibiotics: vancomcyin  HPI: JAIVYN GULLA is a 33 y.o. male with hsitory of infection in finger earlier this year who initially presented to [facility] with significant finger infection of left index finger.  He did not want to be admitted then so was started on Bactrim and sent out.  Blood cultures then grew Staph aureus and he was called to come back in.  Went to [facility], had surgical debridement then left AMA.  Came to [facility] and admitted and now operated on By Dr. Caralyn Guile.  Repeat blood cultures sent.  On IV vancomycin. Patient aware of severity of infection.    Review of Systems: A comprehensive review of systems was negative.  Past Medical History  Diagnosis Date  . Sleep apnea     Stop Bang score of 4    History  Substance Use Topics  . Smoking status: Current Every Day Smoker -- 1.00 packs/day for 20 years    Types: Cigarettes  . Smokeless tobacco: Not on file  . Alcohol Use: Yes     Comment: social    History reviewed. No pertinent family history. Allergies  Allergen Reactions  . Bactrim [Sulfamethoxazole-Tmp Ds]     Caused pancreatitis  . Sulfites     Caused elevated liver enzymes  . Toradol [Ketorolac Tromethamine] Rash    OBJECTIVE: Blood pressure 97/60, pulse 63, temperature 98.8 F (37.1 C), temperature source Oral, resp. rate 18, height 6\' 6"  (1.981 m), weight 250 lb (113.399 kg), SpO2 98.00%. General: awake, alert, nad Skin: multiple  tattos, left hand wrapped Lungs: CTA B Cor: RRR Abdomen: soft, nt, nd Ext: no edema  Microbiology: Recent Results (from the past 240 hour(s))  CULTURE, BLOOD (ROUTINE X 2)     Status: None   Collection Time    03/03/14  7:15 PM      Result Value Ref Range Status   Specimen Description BLOOD LEFT ARM   Final   Special Requests     Final   Value: BOTTLES DRAWN AEROBIC AND ANAEROBIC AEB=13CC ANA=12CC   Culture  Setup Time     Final   Value: 03/04/2014 20:33     Performed at [facility]   Culture     Final   Value: METHICILLIN RESISTANT STAPHYLOCOCCUS AUREUS     Note: SUSCEPTIBILITIES PERFORMED ON PREVIOUS CULTURE WITHIN THE LAST 5 DAYS.     Note: Gram Stain Report Called to,Read Back By and Verified With: GAMMONS S (FLOW MANAGER) AT 1318 ON 03/04/2014 BY Tyrone Schimke M     Performed at [facility]   Report Status 03/07/2014 FINAL   Final  CULTURE, BLOOD (ROUTINE X 2)     Status: None   Collection Time    03/03/14  7:18 PM      Result Value Ref Range Status   Specimen Description BLOOD RIGHT ARM   Final   Special Requests  Final   Value: BOTTLES DRAWN AEROBIC AND ANAEROBIC AEB=10CC ANA=12CC   Culture  Setup Time     Final   Value: 03/04/2014 22:46     Performed at [facility]   Culture     Final   Value: METHICILLIN RESISTANT STAPHYLOCOCCUS AUREUS     Note: This organism DOES NOT demonstrate inducible Clindamycin resistance in vitro.     Note: Gram Stain Report Called to,Read Back By and Verified With: GAMMONS S. AT 1318 ON 03/04/14 BY BAUGHAM M. Performed at [place], READ BACK BY AND VERIFIED WITH: [place] 03/07/14 @ 9:19PM BY RUSCOE A.     Performed at [facility]   Report Status 03/07/2014 FINAL   Final   Organism ID, Bacteria METHICILLIN RESISTANT STAPHYLOCOCCUS AUREUS   Final  SURGICAL PCR SCREEN     Status: None   Collection Time    03/07/14  4:45 PM      Result Value Ref Range Status    MRSA, PCR NEGATIVE  NEGATIVE Final   Staphylococcus aureus NEGATIVE  NEGATIVE Final   Comment:            The Xpert SA Assay (FDA     approved for NASAL specimens     in patients over 16 years of age),     is one component of     a comprehensive surveillance     program.  Test performance has     been validated by [employer] for patients greater     than or equal to 66 year old.     It is not intended     to diagnose infection nor to     guide or monitor treatment.  TISSUE CULTURE     Status: None   Collection Time    03/07/14  5:51 PM      Result Value Ref Range Status   Specimen Description TISSUE LEFT FINGER   Final   Special Requests PATIENT ON FOLLOWING VANCO ZOSYN   Final   Gram Stain PENDING   Incomplete   Culture     Final   Value: NO GROWTH     Performed at [facility]   Report Status PENDING   Incomplete  ANAEROBIC CULTURE     Status: None   Collection Time    03/07/14  5:51 PM      Result Value Ref Range Status   Specimen Description TISSUE LEFT FINGER   Final   Special Requests PATIENT ON FOLLOWING VANCO ZOSYN   Final   Gram Stain PENDING   Incomplete   Culture     Final   Value: NO ANAEROBES ISOLATED; CULTURE IN PROGRESS FOR 5 DAYS     Performed at [facility]   Report Status PENDING   Incomplete    , Herbie Baltimore, [place] [facility] [email or web address] [phone] pager  [phone] cell 03/08/2014, 1:51 PM

## 2014-03-08 NOTE — Progress Notes (Signed)
  Echocardiogram 2D Echocardiogram has been performed.  Benjamin Carter M 03/08/2014, 3:47 PM

## 2014-03-09 LAB — BASIC METABOLIC PANEL
Anion gap: 12 (ref 5–15)
BUN: 8 mg/dL (ref 6–23)
CALCIUM: 8.8 mg/dL (ref 8.4–10.5)
CO2: 23 mEq/L (ref 19–32)
CREATININE: 0.83 mg/dL (ref 0.50–1.35)
Chloride: 105 mEq/L (ref 96–112)
GFR calc Af Amer: >90 mL/min (ref >90)
GLUCOSE: 114 mg/dL — AB (ref 70–99)
Potassium: 3.7 mEq/L (ref 3.7–5.3)
Sodium: 140 mEq/L (ref 137–147)

## 2014-03-09 LAB — CBC
HCT: 35.6 % — ABNORMAL LOW (ref 39.0–52.0)
HEMOGLOBIN: 12.5 g/dL — AB (ref 13.0–17.0)
MCH: 31.8 pg (ref 26.0–34.0)
MCHC: 35.1 g/dL (ref 30.0–36.0)
MCV: 90.6 fL (ref 78.0–100.0)
Platelets: 215 10*3/uL (ref 150–400)
RBC: 3.93 MIL/uL — ABNORMAL LOW (ref 4.22–5.81)
RDW: 12.8 % (ref 11.5–15.5)
WBC: 5.7 10*3/uL (ref 4.0–10.5)

## 2014-03-09 LAB — VANCOMYCIN, TROUGH: Vancomycin Tr: <5 ug/mL — ABNORMAL LOW (ref 10.0–20.0)

## 2014-03-09 MED ORDER — VANCOMYCIN HCL 10 G IV SOLR
1250.0000 mg | Freq: Three times a day (TID) | INTRAVENOUS | Status: DC
  Administered 2014-03-09 – 2014-03-11 (×6): 1250 mg via INTRAVENOUS
  Filled 2014-03-09 (×9): qty 1250

## 2014-03-09 MED ORDER — CHLORHEXIDINE GLUCONATE 4 % EX LIQD
60.0000 mL | Freq: Once | CUTANEOUS | Status: AC
  Administered 2014-03-10: 4 via TOPICAL
  Filled 2014-03-09: qty 60

## 2014-03-09 NOTE — Progress Notes (Signed)
PATIENT DETAILS Name: Benjamin Carter Age: 33 y.o. Sex: male Date of Birth: Jan 03, 1981 Admit Date: 03/07/2014 Admitting Physician Linna Hoff, MD IRJ:JOACZYS,AYTK A, MD  Brief Summary Patient is a 33 year old white male with no significant past medical history whowas seen on 8/12 at [facility] for left index finger infection, he refused admission and was discharged home on oral Bactrim. Blood cultures were obtained on 8/12 at [facility] emergency department. Subsequently blood cultures came back positive for MRSA, patient was then contacted, and admitted to [facility] on 8/16. Hand surgery was consulted, patient has undergone incision and debridement on 8/16, current plans are for possible amputation of the left index finger on 8/19. Infectious disease continues to follow for MRSA bacteremia.  Subjective: Patient has no new complaints. He says his pain is well controlled. He verbalizes understanding of his medical care plan.  Assessment/Plan:  MRSA Bacteremia  -secondary to left index finger infection  -Patient was seen on 8/12 at [facility] for finger infection,blood Cultures 8/12 positive for Methicillin Resistant Staphylococcus Aureus. Unfortunately, patient refused admission and was sent home on Bactrim, he was later notified on blood culture results. Admitted to [facility] on 8/16, started on IV Vancomycin Day 3. Hand surgery consulted. Repeat Blood cultures 8/17, NGTD. TTE negative for vegetation, have consulted cards for TEE.  Left index finger infection  -as noted above, seen in [facility] ED on 8/12 and started on Bactrim. Patient refused admission then. After blood cultures came back positive patient was contacted. He was admitted to [facility] on 8/16. Hand surgery-Dr Apolonio Schneiders was consulted, underwent Incision and debridement on 8/16. Plans are to go back to the OR on Wednesday, 8/19, may require amputation. Continue with Vancomycin day 3. Intraoperative wound cultures  negative so far-was already on bactrim for a few days as outpatient.  -prn narcotics for pain   Anxiety  -Home regimen Xanax TID as needed for anxiety   Note-Dr  explained to patient in great detail that patient has MRSA bacteremia and the serious nature of the finger infection, and potential life threatening/disabling consequences of leaving the hospital before ready for discharge. He was also made aware that future OR procedures/TEE are scheduled depending on many factors and not just patient convenience/desire. He understands.   Disposition:  Remain inpatient   DVT Prophylaxis:  SCD's/encouraged ambulation  Code Status:  Full code   Family Communication  No family present   Procedures:  8/19 Left index finger I&D and probable amputation to DIP joint  CONSULTS:  ID and orthopedic surgery   MEDICATIONS: Scheduled Meds: . polyethylene glycol  17 g Oral Daily  . pregabalin  225 mg Oral BID  . vancomycin  1,000 mg Intravenous Q8H   Continuous Infusions:  PRN Meds:.acetaminophen, acetaminophen, ALPRAZolam, morphine injection, ondansetron (ZOFRAN) IV, ondansetron, oxyCODONE  Antibiotics: Anti-infectives   Start     Dose/Rate Route Frequency Ordered Stop   03/09/14 2115  vancomycin (VANCOCIN) IVPB 1000 mg/200 mL premix     1,000 mg 200 mL/hr over 60 Minutes Intravenous Every 8 hours 03/08/14 2103     03/08/14 0600  vancomycin (VANCOCIN) 1,500 mg in sodium chloride 0.9 % 500 mL IVPB     1,500 mg 250 mL/hr over 120 Minutes Intravenous On call to O.R. 03/07/14 1635 03/07/14 1742   03/07/14 1800  vancomycin (VANCOCIN) IVPB 1000 mg/200 mL premix  Status:  Discontinued     1,000 mg 200 mL/hr over 60  Minutes Intravenous Every 8 hours 03/07/14 1656 03/08/14 2103       PHYSICAL EXAM: Vital signs in last 24 hours: Filed Vitals:   03/08/14 0510 03/08/14 1507 03/08/14 2224 03/09/14 0655  BP: 97/60 122/78 109/60 115/51  Pulse: 63 64 62 82  Temp: 98.8 F (37.1 C) 98.3  F (36.8 C) 98.3 F (36.8 C) 98.8 F (37.1 C)  TempSrc: Oral Oral Oral Oral  Resp: 18 20 16 16   Height:      Weight:      SpO2: 98% 97% 97% 98%   Weight change:  Filed Weights   03/07/14 1457 03/07/14 1631  Weight: 113.399 kg (250 lb) 113.399 kg (250 lb)   Body mass index is 28.9 kg/(m^2).   Gen Exam: Awake and alert with clear speech, lying in bed, cooperative Neck: Supple, No JVD.   Chest: B/L Clear.   CVS: S1 S2 Regular, no murmurs.  Abdomen: soft, BS +, non tender, non distended.  Extremities: no edema, lower extremities warm to touch. Neurologic: Non Focal.   Skin: No Rash.   Wounds:Left hand and forearm bandaged and in an ace wrap. Able to move all fingers without difficulty.   LAB RESULTS: CBC  Recent Labs Lab 03/03/14 1845 03/06/14 2219 03/07/14 0637 03/08/14 0525 03/09/14 0546  WBC 12.6* 8.8 6.7 6.4 5.7  HGB 15.4 13.3 12.6* 11.3* 12.5*  HCT 43.9 36.9* 35.7* 33.1* 35.6*  PLT 206 207 176 172 215  MCV 91.6 89.1 90.2 91.2 90.6  MCH 32.2 32.1 31.8 31.1 31.8  MCHC 35.1 36.0 35.3 34.1 35.1  RDW 12.4 12.4 12.4 12.7 12.8  LYMPHSABS 1.9 2.0  --   --   --   MONOABS 1.1* 0.8  --   --   --   EOSABS 0.1 0.1  --   --   --   BASOSABS 0.0 0.0  --   --   --     Chemistries   Recent Labs Lab 03/03/14 1845 03/06/14 2312 03/07/14 0637 03/08/14 0525 03/09/14 0546  NA 138 132* 134* 137 140  K 3.8 3.7 4.1 4.2 3.7  CL 101 97 100 103 105  CO2 25 22 25 23 23   GLUCOSE 96 163* 132* 106* 114*  BUN 13 12 9 7 8   CREATININE 0.95 1.11 1.06 0.96 0.83  CALCIUM 9.5 8.8 8.3* 8.3* 8.8   GFR Estimated Creatinine Clearance: 179.4 ml/min (by C-G formula based on Cr of 0.83).   Recent Labs  03/06/14 2312 03/07/14 0918  LIPASE 649* 279*   MICROBIOLOGY: Recent Results (from the past 240 hour(s))  CULTURE, BLOOD (ROUTINE X 2)     Status: None   Collection Time    03/03/14  7:15 PM      Result Value Ref Range Status   Specimen Description BLOOD LEFT ARM   Final    Special Requests     Final   Value: BOTTLES DRAWN AEROBIC AND ANAEROBIC AEB=13CC ANA=12CC   Culture  Setup Time     Final   Value: 03/04/2014 20:33     Performed at [facility]   Culture     Final   Value: METHICILLIN RESISTANT STAPHYLOCOCCUS AUREUS     Note: SUSCEPTIBILITIES PERFORMED ON PREVIOUS CULTURE WITHIN THE LAST 5 DAYS.     Note: Gram Stain Report Called to,Read Back By and Verified With: GAMMONS S (FLOW MANAGER) AT 1318 ON 03/04/2014 BY Tyrone Schimke M     Performed at [facility]   Report  Status 03/07/2014 FINAL   Final  CULTURE, BLOOD (ROUTINE X 2)     Status: None   Collection Time    03/03/14  7:18 PM      Result Value Ref Range Status   Specimen Description BLOOD RIGHT ARM   Final   Special Requests     Final   Value: BOTTLES DRAWN AEROBIC AND ANAEROBIC AEB=10CC ANA=12CC   Culture  Setup Time     Final   Value: 03/04/2014 22:46     Performed at [facility]   Culture     Final   Value: METHICILLIN RESISTANT STAPHYLOCOCCUS AUREUS     Note: This organism DOES NOT demonstrate inducible Clindamycin resistance in vitro.     Note: Gram Stain Report Called to,Read Back By and Verified With: GAMMONS S. AT 1318 ON 03/04/14 BY BAUGHAM M. Performed at [place], READ BACK BY AND VERIFIED WITH: [place] 03/07/14 @ 9:19PM BY RUSCOE A.     Performed at [facility]   Report Status 03/07/2014 FINAL   Final   Organism ID, Bacteria METHICILLIN RESISTANT STAPHYLOCOCCUS AUREUS   Final  SURGICAL PCR SCREEN     Status: None   Collection Time    03/07/14  4:45 PM      Result Value Ref Range Status   MRSA, PCR NEGATIVE  NEGATIVE Final   Staphylococcus aureus NEGATIVE  NEGATIVE Final   Comment:            The Xpert SA Assay (FDA     approved for NASAL specimens     in patients over 74 years of age),     is one component of     a comprehensive surveillance     program.  Test performance has     been validated by  [employer] for patients greater     than or equal to 34 year old.     It is not intended     to diagnose infection nor to     guide or monitor treatment.  TISSUE CULTURE     Status: None   Collection Time    03/07/14  5:51 PM      Result Value Ref Range Status   Specimen Description TISSUE LEFT FINGER   Final   Special Requests PATIENT ON FOLLOWING VANCO ZOSYN   Final   Gram Stain PENDING   Incomplete   Culture     Final   Value: RARE STAPHYLOCOCCUS AUREUS     Note: RIFAMPIN AND GENTAMICIN SHOULD NOT BE USED AS SINGLE DRUGS FOR TREATMENT OF STAPH INFECTIONS.     Performed at [facility]   Report Status PENDING   Incomplete  ANAEROBIC CULTURE     Status: None   Collection Time    03/07/14  5:51 PM      Result Value Ref Range Status   Specimen Description TISSUE LEFT FINGER   Final   Special Requests PATIENT ON FOLLOWING VANCO ZOSYN   Final   Gram Stain     Final   Value: NO WBC SEEN     NO SQUAMOUS EPITHELIAL CELLS SEEN     NO ORGANISMS SEEN     Performed at [facility]   Culture     Final   Value: NO ANAEROBES ISOLATED; CULTURE IN PROGRESS FOR 5 DAYS     Performed at [facility]   Report Status PENDING  Incomplete  ANAEROBIC CULTURE     Status: None   Collection Time    03/07/14  5:56 PM      Result Value Ref Range Status   Specimen Description TISSUE FINGER LEFT   Final   Special Requests     Final   Value: LEFT INDEX FINGER PT ON VANCOMYCIN AND ZOSYN NO 2 TISSUE   Gram Stain     Final   Value: FEW WBC PRESENT,BOTH PMN AND MONONUCLEAR     RARE SQUAMOUS EPITHELIAL CELLS PRESENT     NO ORGANISMS SEEN     Performed at [facility]   Culture PENDING   Incomplete   Report Status PENDING   Incomplete  TISSUE CULTURE     Status: None   Collection Time    03/07/14  5:56 PM      Result Value Ref Range Status   Specimen Description TISSUE FINGER LEFT   Final   Special Requests     Final   Value: LEFT INDEX FINGER PT ON VANCOMYCIN  AND ZOSYN NO 2 TISSUE   Gram Stain     Final   Value: FEW WBC PRESENT,BOTH PMN AND MONONUCLEAR     RARE SQUAMOUS EPITHELIAL CELLS PRESENT     NO ORGANISMS SEEN     Performed at [facility]   Culture     Final   Value: FEW STAPHYLOCOCCUS AUREUS     Performed at [facility]   Report Status PENDING   Incomplete  TISSUE CULTURE     Status: None   Collection Time    03/07/14  5:56 PM      Result Value Ref Range Status   Specimen Description TISSUE FINGER LEFT BONE   Final   Special Requests     Final   Value: NO 3 BONE LEFT INDEX FINGER PT ON VANCOMYCIN AND ZOSYN   Gram Stain     Final   Value: RARE WBC PRESENT, PREDOMINANTLY MONONUCLEAR     NO SQUAMOUS EPITHELIAL CELLS SEEN     RARE GRAM POSITIVE COCCI     IN PAIRS     Performed at [facility]   Culture     Final   Value: FEW STAPHYLOCOCCUS AUREUS     Performed at [facility]   Report Status PENDING   Incomplete  ANAEROBIC CULTURE     Status: None   Collection Time    03/07/14  5:56 PM      Result Value Ref Range Status   Specimen Description TISSUE FINGER LEFT BONE   Final   Special Requests     Final   Value: NO 3 BONE LEFT INDEX FINGER PT ON VANCOMYCIN AND ZOSYN   Gram Stain     Final   Value: RARE WBC PRESENT, PREDOMINANTLY MONONUCLEAR     NO SQUAMOUS EPITHELIAL CELLS SEEN     RARE GRAM POSITIVE COCCI     IN PAIRS     Performed at [facility]   Culture PENDING   Incomplete   Report Status PENDING   Incomplete  CULTURE, BLOOD (ROUTINE X 2)     Status: None   Collection Time    03/08/14  8:11 AM      Result Value Ref Range Status   Specimen Description BLOOD RIGHT ANTECUBITAL   Final   Special Requests BOTTLES DRAWN AEROBIC AND ANAEROBIC 10CC   Final   Culture  Setup Time     Final  Value: 03/08/2014 15:07     Performed at [facility]   Culture     Final   Value:        BLOOD CULTURE RECEIVED NO GROWTH TO DATE CULTURE WILL BE HELD FOR 5 DAYS BEFORE ISSUING A  FINAL NEGATIVE REPORT     Performed at [facility]   Report Status PENDING   Incomplete  CULTURE, BLOOD (ROUTINE X 2)     Status: None   Collection Time    03/08/14  8:18 AM      Result Value Ref Range Status   Specimen Description BLOOD LEFT ANTECUBITAL   Final   Special Requests BOTTLES DRAWN AEROBIC AND ANAEROBIC 10CC   Final   Culture  Setup Time     Final   Value: 03/08/2014 15:07     Performed at [facility]   Culture     Final   Value:        BLOOD CULTURE RECEIVED NO GROWTH TO DATE CULTURE WILL BE HELD FOR 5 DAYS BEFORE ISSUING A FINAL NEGATIVE REPORT     Performed at [facility]   Report Status PENDING   Incomplete    RADIOLOGY STUDIES/RESULTS: Dg Hand Complete Left 03/06/2014   CLINICAL DATA:  Pain, swelling and erythema at a wound between the index and middle fingers of the left hand. Purulent discharge.  EXAM: LEFT HAND - COMPLETE 3+ VIEW  COMPARISON:  None.  FINDINGS: The known soft tissue wound is not well characterized. There is diffuse soft tissue swelling about the second and third metacarpophalangeal joints, and along the second finger. No radiopaque foreign bodies are seen. There is no evidence of osseous erosion at this time.  Visualized joint spaces are preserved. The carpal rows appear grossly intact, and demonstrate normal alignment. 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  IMPRESSION: 1. Known soft tissue wound is not well characterized. No radiopaque foreign bodies seen. Diffuse soft tissue swelling about the second and third metacarpophalangeal joints, and along the second finger. 2. No evidence of osseous erosion at this time. 3. 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   Electronically Signed   By: Garald Balding M.D.   On: 03/06/2014 22:18    Audelia Acton, PA-S [facility] Pager:[phone]  [phone]  If 7PM-7AM, please contact night-coverage [email or web address] Password [email or web address] 03/09/2014, 10:02 AM   LOS: 2 days   **Disclaimer: This note may have been dictated with voice recognition software. Similar sounding words can inadvertently be transcribed and this note may contain transcription errors which may not have been corrected upon publication of note.**  Attending Patient was seen, examined,treatment plan was discussed with the Physician extender. I have directly reviewed the clinical findings, lab, imaging studies and management of this patient in detail. I have made the necessary changes to the above noted documentation, and agree with the documentation, as recorded by the Physician extender.  Nena Alexander MD [facility].

## 2014-03-09 NOTE — Progress Notes (Addendum)
ANTIBIOTIC CONSULT NOTE - Follow Up  Pharmacy Consult for Vancomycin  Indication: MRSA bacteremia   Allergies  Allergen Reactions  . Bactrim [Sulfamethoxazole-Tmp Ds]     Caused pancreatitis  . Sulfites     Caused elevated liver enzymes  . Toradol [Ketorolac Tromethamine] Rash    Patient Measurements: Height: 6\' 6"  (198.1 cm) Weight: 250 lb (113.399 kg) IBW/kg (Calculated) : 91.4 Adjusted Body Weight: n/a   Vital Signs: Temp: 98.8 F (37.1 C) (08/18 0655) Temp src: Oral (08/18 0655) BP: 115/51 mmHg (08/18 0655) Pulse Rate: 82 (08/18 0655) Intake/Output from previous day:   Intake/Output from this shift:    Labs:  Recent Labs  03/07/14 0637 03/08/14 0525 03/09/14 0546  WBC 6.7 6.4 5.7  HGB 12.6* 11.3* 12.5*  PLT 176 172 215  CREATININE 1.06 0.96 0.83   Estimated Creatinine Clearance: 179.4 ml/min (by C-G formula based on Cr of 0.83).  Recent Labs  03/09/14 0940  VANCOTROUGH <5.0*     Assessment: 84 YOM on Vancomycin for MRSA bacteremia likely from worsening left finger infection. A trough collected today was <5 because patient lost IV access yesterday and last night's dose was not given. Despite missed dose, anticipated trough to be slightly higher than 5. Likely was being under-dosed. WBC remains wnl. Pt is afebrile. TTE neg for vegetation. TEE pending.   Vanc 8/17>>   8/16 Blood Cx x2>> Gm Pos Cocci in pairs  8/16 Wound Cx >> Staph aureus    Goal of Therapy:  Vancomycin trough level 15-20 mcg/ml  Plan:  -Increase Vancomycin to 1250 mg IV Q 8 hours to start now  -Monitor CBC, renal fx, cultures and patient's clinical progress -Repeat VT at [facility], PharmD.  Clinical Pharmacist Pager [phone]     Addendum: Correction to above note. Blood Cx remain negative to date.   Albertina Parr, PharmD.  Clinical Pharmacist Pager [phone]

## 2014-03-09 NOTE — Consult Note (Signed)
CHART REVIEWED AS PER PLAN WILL RETURN TO OR TOMORROW PM FOR REPEAT I/D AND LIKELY AMPUTATION THROUGH DIP JOINT. WILL PUT IN PREOP ORDERS WILL HAVE BETTER ASSESSMENT OF FINGER TOMORROW WITH REPEAT SURGERY TO CLEAN OUT INFECTION

## 2014-03-09 NOTE — Progress Notes (Signed)
Patient left floor/unit staff unaware until going into his room @ 1525. Returned 15-20 minutes later, stating he was unaware he could leave. Had a  Conversation with him about leaving the unit without an order (this was the second time in two days). Informed him that we will sign him out AMA and he will have to enter through the ED. I also told him he cannot disconnect his IV. 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 I told him that while he is here only our staff can do the disconnecting. He apologized and said he would not do it again.

## 2014-03-09 NOTE — Progress Notes (Signed)
    [facility] has been requested to perform a transesophageal echocardiogram; however, patient is REFUSING. Will cancel the procedure.    Perry Mount, PA-C 03/09/2014 3:02 PM

## 2014-03-10 ENCOUNTER — Encounter (HOSPITAL_COMMUNITY): Discharge status: Against Medical Advice | Payer: Self-pay | Source: Intra-hospital | Attending: Internal Medicine

## 2014-03-10 ENCOUNTER — Encounter (HOSPITAL_COMMUNITY): Payer: Medicare Other | Admitting: Anesthesiology

## 2014-03-10 ENCOUNTER — Inpatient Hospital Stay (HOSPITAL_COMMUNITY): Payer: Medicare Other | Admitting: Anesthesiology

## 2014-03-10 ENCOUNTER — Encounter (HOSPITAL_COMMUNITY): Payer: Self-pay | Admitting: Anesthesiology

## 2014-03-10 DIAGNOSIS — L089 Local infection of the skin and subcutaneous tissue, unspecified: Secondary | ICD-10-CM

## 2014-03-10 DIAGNOSIS — A4902 Methicillin resistant Staphylococcus aureus infection, unspecified site: Secondary | ICD-10-CM

## 2014-03-10 HISTORY — PX: AMPUTATION: SHX166

## 2014-03-10 LAB — TISSUE CULTURE: Gram Stain: NONE SEEN

## 2014-03-10 LAB — VANCOMYCIN, TROUGH: VANCOMYCIN TR: 16 ug/mL (ref 10.0–20.0)

## 2014-03-10 SURGERY — ECHOCARDIOGRAM, TRANSESOPHAGEAL
Anesthesia: Moderate Sedation

## 2014-03-10 SURGERY — AMPUTATION DIGIT
Anesthesia: General | Site: Finger | Laterality: Left

## 2014-03-10 MED ORDER — OXYCODONE HCL 5 MG/5ML PO SOLN: 5.0000 mg | Freq: Once | ORAL | Status: AC | PRN

## 2014-03-10 MED ORDER — HYDROMORPHONE HCL PF 1 MG/ML IJ SOLN
0.2500 mg | INTRAMUSCULAR | Status: DC | PRN
  Administered 2014-03-10 (×2): 0.5 mg via INTRAVENOUS

## 2014-03-10 MED ORDER — MIDAZOLAM HCL 2 MG/2ML IJ SOLN
INTRAMUSCULAR | Status: AC
  Filled 2014-03-10: qty 2

## 2014-03-10 MED ORDER — EPHEDRINE SULFATE 50 MG/ML IJ SOLN
INTRAMUSCULAR | Status: DC | PRN
  Administered 2014-03-10: 5 mg via INTRAVENOUS

## 2014-03-10 MED ORDER — ROCURONIUM BROMIDE 50 MG/5ML IV SOLN
INTRAVENOUS | Status: AC
  Filled 2014-03-10: qty 1

## 2014-03-10 MED ORDER — LIDOCAINE HCL (CARDIAC) 20 MG/ML IV SOLN
INTRAVENOUS | Status: AC
  Filled 2014-03-10: qty 5

## 2014-03-10 MED ORDER — OXYCODONE HCL 5 MG PO TABS
5.0000 mg | ORAL_TABLET | Freq: Once | ORAL | Status: AC | PRN
  Administered 2014-03-10: 5 mg via ORAL

## 2014-03-10 MED ORDER — MEPERIDINE HCL 25 MG/ML IJ SOLN: 6.2500 mg | INTRAMUSCULAR | Status: DC | PRN

## 2014-03-10 MED ORDER — LIDOCAINE HCL (CARDIAC) 20 MG/ML IV SOLN
INTRAVENOUS | Status: DC | PRN
  Administered 2014-03-10: 100 mg via INTRAVENOUS

## 2014-03-10 MED ORDER — FENTANYL CITRATE 0.05 MG/ML IJ SOLN
INTRAMUSCULAR | Status: AC
  Filled 2014-03-10: qty 5

## 2014-03-10 MED ORDER — PROPOFOL 10 MG/ML IV BOLUS
INTRAVENOUS | Status: DC | PRN
  Administered 2014-03-10: 200 mg via INTRAVENOUS

## 2014-03-10 MED ORDER — MIDAZOLAM HCL 5 MG/5ML IJ SOLN
INTRAMUSCULAR | Status: DC | PRN
  Administered 2014-03-10: 2 mg via INTRAVENOUS

## 2014-03-10 MED ORDER — BUPIVACAINE HCL (PF) 0.25 % IJ SOLN
INTRAMUSCULAR | Status: AC
  Filled 2014-03-10: qty 30

## 2014-03-10 MED ORDER — ONDANSETRON HCL 4 MG/2ML IJ SOLN
INTRAMUSCULAR | Status: DC | PRN
  Administered 2014-03-10: 4 mg via INTRAVENOUS

## 2014-03-10 MED ORDER — PROMETHAZINE HCL 25 MG/ML IJ SOLN: 6.2500 mg | INTRAMUSCULAR | Status: DC | PRN

## 2014-03-10 MED ORDER — FENTANYL CITRATE 0.05 MG/ML IJ SOLN
INTRAMUSCULAR | Status: DC | PRN
  Administered 2014-03-10 (×3): 50 ug via INTRAVENOUS

## 2014-03-10 MED ORDER — 0.9 % SODIUM CHLORIDE (POUR BTL) OPTIME
TOPICAL | Status: DC | PRN
  Administered 2014-03-10: 1000 mL

## 2014-03-10 MED ORDER — HYDROMORPHONE HCL PF 1 MG/ML IJ SOLN
INTRAMUSCULAR | Status: AC
  Filled 2014-03-10: qty 1

## 2014-03-10 MED ORDER — ONDANSETRON HCL 4 MG/2ML IJ SOLN
INTRAMUSCULAR | Status: AC
  Filled 2014-03-10: qty 2

## 2014-03-10 MED ORDER — LACTATED RINGERS IV SOLN
INTRAVENOUS | Status: DC | PRN
  Administered 2014-03-10: 17:00:00 via INTRAVENOUS

## 2014-03-10 MED ORDER — OXYCODONE HCL 5 MG PO TABS
ORAL_TABLET | ORAL | Status: AC
  Filled 2014-03-10: qty 1

## 2014-03-10 MED ORDER — PROPOFOL 10 MG/ML IV BOLUS
INTRAVENOUS | Status: AC
  Filled 2014-03-10: qty 20

## 2014-03-10 SURGICAL SUPPLY — 47 items
BANDAGE COBAN STERILE 2 (GAUZE/BANDAGES/DRESSINGS) ×3 IMPLANT
BANDAGE ELASTIC 3 VELCRO ST LF (GAUZE/BANDAGES/DRESSINGS) IMPLANT
BANDAGE ELASTIC 4 VELCRO ST LF (GAUZE/BANDAGES/DRESSINGS) IMPLANT
BNDG COHESIVE 1X5 TAN STRL LF (GAUZE/BANDAGES/DRESSINGS) ×3 IMPLANT
BNDG CONFORM 2 STRL LF (GAUZE/BANDAGES/DRESSINGS) ×3 IMPLANT
BNDG ELASTIC 2 VLCR STRL LF (GAUZE/BANDAGES/DRESSINGS) ×3 IMPLANT
BNDG ESMARK 4X9 LF (GAUZE/BANDAGES/DRESSINGS) ×3 IMPLANT
BNDG GAUZE ELAST 4 BULKY (GAUZE/BANDAGES/DRESSINGS) ×3 IMPLANT
CONT SPECI 4OZ STER CLIK (MISCELLANEOUS) ×3 IMPLANT
CORDS BIPOLAR (ELECTRODE) ×3 IMPLANT
COVER SURGICAL LIGHT HANDLE (MISCELLANEOUS) ×3 IMPLANT
CUFF TOURNIQUET SINGLE 18IN (TOURNIQUET CUFF) ×3 IMPLANT
CUFF TOURNIQUET SINGLE 24IN (TOURNIQUET CUFF) IMPLANT
DRAPE SURG 17X23 STRL (DRAPES) ×3 IMPLANT
DRSG ADAPTIC 3X8 NADH LF (GAUZE/BANDAGES/DRESSINGS) ×3 IMPLANT
GAUZE SPONGE 2X2 8PLY STRL LF (GAUZE/BANDAGES/DRESSINGS) IMPLANT
GAUZE SPONGE 4X4 12PLY STRL (GAUZE/BANDAGES/DRESSINGS) IMPLANT
GLOVE BIOGEL PI IND STRL 8.5 (GLOVE) ×1 IMPLANT
GLOVE BIOGEL PI INDICATOR 8.5 (GLOVE) ×2
GLOVE SURG ORTHO 8.0 STRL STRW (GLOVE) ×3 IMPLANT
GOWN STRL REUS W/ TWL LRG LVL3 (GOWN DISPOSABLE) ×2 IMPLANT
GOWN STRL REUS W/ TWL XL LVL3 (GOWN DISPOSABLE) ×1 IMPLANT
GOWN STRL REUS W/TWL LRG LVL3 (GOWN DISPOSABLE) ×4
GOWN STRL REUS W/TWL XL LVL3 (GOWN DISPOSABLE) ×2
KIT BASIN OR (CUSTOM PROCEDURE TRAY) ×3 IMPLANT
KIT ROOM TURNOVER OR (KITS) ×3 IMPLANT
MANIFOLD NEPTUNE II (INSTRUMENTS) ×3 IMPLANT
NEEDLE HYPO 25GX1X1/2 BEV (NEEDLE) IMPLANT
NS IRRIG 1000ML POUR BTL (IV SOLUTION) ×3 IMPLANT
PACK ORTHO EXTREMITY (CUSTOM PROCEDURE TRAY) ×3 IMPLANT
PAD ARMBOARD 7.5X6 YLW CONV (MISCELLANEOUS) ×6 IMPLANT
PAD CAST 4YDX4 CTTN HI CHSV (CAST SUPPLIES) IMPLANT
PADDING CAST COTTON 4X4 STRL (CAST SUPPLIES)
SOAP 2 % CHG 4 OZ (WOUND CARE) ×3 IMPLANT
SPECIMEN JAR SMALL (MISCELLANEOUS) ×3 IMPLANT
SPONGE GAUZE 2X2 STER 10/PKG (GAUZE/BANDAGES/DRESSINGS)
SPONGE GAUZE 4X4 12PLY STER LF (GAUZE/BANDAGES/DRESSINGS) ×3 IMPLANT
SUCTION FRAZIER TIP 10 FR DISP (SUCTIONS) IMPLANT
SUT MERSILENE 4 0 P 3 (SUTURE) IMPLANT
SUT PROLENE 4 0 PS 2 18 (SUTURE) ×9 IMPLANT
SYR CONTROL 10ML LL (SYRINGE) IMPLANT
SYRINGE CONTROL L 12CC (SYRINGE) ×3 IMPLANT
TOWEL OR 17X24 6PK STRL BLUE (TOWEL DISPOSABLE) ×3 IMPLANT
TOWEL OR 17X26 10 PK STRL BLUE (TOWEL DISPOSABLE) ×3 IMPLANT
TUBE CONNECTING 12'X1/4 (SUCTIONS)
TUBE CONNECTING 12X1/4 (SUCTIONS) IMPLANT
WATER STERILE IRR 1000ML POUR (IV SOLUTION) ×3 IMPLANT

## 2014-03-10 NOTE — Progress Notes (Signed)
Food seen in patient's room. Pt stated "my wife brought it for herself" Patient and patient's wife educated that patient will be going into the OR this evening and what the complications are if he were to eat prior to surgery. Pt became upset, but both pt and pt's wife assured RN that pt did not eat anything. Pt wife also requested to see cardiologist. MD Marlou Porch notified via text page. Will continue to monitor.

## 2014-03-10 NOTE — Progress Notes (Signed)
PT SEEN/EXAMINED PLAN FOR REPEAT IRRIGATION AND DEBRIDEMENT AND LIKELY REVISION AMPUTATION OF LEFT INDEX FINGER AT LEVEL OF DIP JOINT PT VOICED UNDERSTANDING OF THE PLAN CONSENT SIGNED

## 2014-03-10 NOTE — Progress Notes (Signed)
ANTIBIOTIC CONSULT NOTE - Follow Up  Pharmacy Consult for Vancomycin  Indication: MRSA bacteremia   Allergies  Allergen Reactions  . Bactrim [Sulfamethoxazole-Tmp Ds]     Caused pancreatitis  . Sulfites     Caused elevated liver enzymes  . Toradol [Ketorolac Tromethamine] Rash    Patient Measurements: Height: 6\' 6"  (198.1 cm) Weight: 250 lb (113.399 kg) IBW/kg (Calculated) : 91.4 Adjusted Body Weight: n/a   Vital Signs: Temp: 98.1 F (36.7 C) (08/19 2104) Temp src: Oral (08/19 2104) BP: 131/73 mmHg (08/19 2104) Pulse Rate: 59 (08/19 2104) Intake/Output from previous day: 08/18 0701 - 08/19 0700 In: 554 [P.O.:554] Out: -  Intake/Output from this shift:    Labs:  Recent Labs  03/08/14 0525 03/09/14 0546  WBC 6.4 5.7  HGB 11.3* 12.5*  PLT 172 215  CREATININE 0.96 0.83   Estimated Creatinine Clearance: 179.4 ml/min (by C-G formula based on Cr of 0.83).  Recent Labs  03/09/14 0940 03/10/14 2021  VANCOTROUGH <5.0* 16.0     Assessment: Vancomycin trough tonight is therapeutic at 16 mcg/ml in this 33 YOM on Vancomycin 1250 mg IV q8h for MRSA bacteremia likely from worsening left finger infection. WBC remains wnl. Pt is afebrile. TTE neg for vegetation. TEE pending.   Vanc 8/17>>    8/16 Wound Cx >> MRSA  8/17 Blood  Cx x2>> NGTD   Goal of Therapy:  Vancomycin trough level 15-20 mcg/ml  Plan:  Continue Vancomycin to 1250 mg IV Q 8 hours.   Monitor CBC, renal fx, cultures and patient's clinical progress Repeat VT at least once weekly.  Nicole Cella, RPh Clinical Pharmacist Pager: [phone] 03/10/2014, 9:51 PM

## 2014-03-10 NOTE — Progress Notes (Signed)
   Spoke to nurse. ID recommended TEE vs. 6 weeks of abx. He now wants to go forward with TEE. Called Trish to set up once again.   Candee Furbish, MD

## 2014-03-10 NOTE — Brief Op Note (Signed)
03/07/2014 - 03/10/2014  5:59 PM  PATIENT:  Benjamin Carter  33 y.o. male  PRE-OPERATIVE DIAGNOSIS:  Left Index Finger Traumatic Amputation  POST-OPERATIVE DIAGNOSIS:  Left Index Finger Traumatic Amputation  PROCEDURE:  Procedure(s) with comments: Irrigation and Debridement and Revision of Amputation Left Index Finger (Left) - Wants to go at [place]:  Surgeon(s) and Role:    * Linna Hoff, MD - Primary  PHYSICIAN ASSISTANT: none  ASSISTANTS: none   ANESTHESIA:   general  EBL:  Total I/O In: 600 [I.V.:600] Out: 25 [Blood:25]  BLOOD ADMINISTERED:none  DRAINS: none   LOCAL MEDICATIONS USED:  MARCAINE     SPECIMEN:  No Specimen  DISPOSITION OF SPECIMEN:  N/A  COUNTS:  YES  TOURNIQUET:   Total Tourniquet Time Documented: Upper Arm (Left) - 22 minutes Total: Upper Arm (Left) - 22 minutes   DICTATION: .Other Dictation: Dictation Number [id number]  PLAN OF CARE: Discharge to home after PACU  PATIENT DISPOSITION:  PACU - hemodynamically stable.   Delay start of Pharmacological VTE agent (>24hrs) due to surgical blood loss or risk of bleeding: not applicable

## 2014-03-10 NOTE — Progress Notes (Signed)
Pt underwent debridement and amputation of left index finger through dip joint Finger overall looked much better today Would keep current bandage in place for one week and I can see him back in office Please contact me via my cell phone with any questions [phone] Keep bandage on finger at all times Oral/iv abx per infectious disease

## 2014-03-10 NOTE — Op Note (Signed)
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  ATTENDING PHYSICIAN:  Linna Hoff IV, MD, who scrubbed and present for the entire procedure.  ASSISTANT SURGEON:  None.  ANESTHESIA:  General via LMA.  SURGICAL PROCEDURE: 1. Left index finger flexor sheath drainage. 2. Left index finger excisional debridement of skin and subcutaneous     tissue. 3. Left index finger revision amputation through the level of the     distal interphalangeal joint with local neurectomies and primary     closure.  SURGICAL INDICATIONS:  Benjamin Carter is a 33 year old gentleman with very bad infection of his left index finger.  He was brought to the operating room several days ago where he underwent initial washout and drainage. The patient returned for scheduled surgical intervention for the left index finger.  Risks, benefits, and alternatives were discussed in detail with the patient and a signed informed consent was obtained.  DESCRIPTION OF PROCEDURE:  The patient was properly identified in the preoperative holding area and marked with a permanent marker made on the left index finger to indicate correct operative site.  The patient was then brought back to the operating room, placed supine on anesthesia room table where the general anesthetic was administered.  The patient tolerated this well.  A well-padded tourniquet was placed on the left brachium and sealed with 1000 drape.  Left upper extremity was then prepped and draped in normal sterile fashion.  Time-out  was called. Correct site was identified, and procedure then begun.  Attention then turned to left index finger where the previous skin incisions were then opened up.  The flexor sheath was then opened up and drained.  Drainage of the flexor sheath was then carried out.  The patient did have a necrotic tissue over the FDP tendon.  Excisional debridement was then carried out of the FDP tendon of the distal stump, skin and subcutaneous tissues with excisional debridement that was carried out with sharp scalpel and scissors.  The distal phalanx was not in good continuity, therefore completion amputation was then carried out through the distal interphalangeal joint.  The middle phalanx appeared to be good bone quality.  The amputation was then carried out.  Local neurectomies were then carried out of the radial and ulnar digital nerves.  These were allowed to retract proximally as well as without resection of the FDP tendons allow to retract proximally.  Once this was undertaken, the wound was copiously irrigated.  With the soft tissue envelope, I was able to close this primarily with simple Prolene sutures.  Once this was carried out, the Adaptic dressing was then applied.  Sterile compressive bandage then applied.  The patient then extubated and taken to recovery room in good condition.  POSTPROCEDURE PLAN:  The patient will be admitted back to the Internal Medicine service and seen back in the office in approximately 1 week for wound check and sutures and for approximately 2-3 weeks depending on wound healing.  In fact, IV antibiotics and oral antibiotics as per Infectious Disease and Internal Medicine Service.  Continue to follow him closely.     Melrose Nakayama, MD     FWO/MEDQ  D:  03/10/2014  T:  03/10/2014  Job:  [id number]

## 2014-03-10 NOTE — Progress Notes (Signed)
PATIENT DETAILS Name: Benjamin Carter Age: 33 y.o. Sex: male Date of Birth: [birth year] Admit Date: 03/07/2014 Admitting Physician Linna Hoff, MD LOV:FIEPPIR,JJOA A, MD  Brief Summary Patient is a 33 year old white male with no significant past medical history whowas seen on 8/12 at [facility] for left index finger infection, he refused admission and was discharged home on oral Bactrim. Blood cultures were obtained on 8/12 at [facility] emergency department. Subsequently blood cultures came back positive for MRSA, patient was then contacted, and admitted to [facility] on 8/16. Hand surgery was consulted, patient has undergone incision and debridement on 8/16, current plans are for possible amputation of the left index finger on 8/19. Infectious disease continues to follow for MRSA bacteremia.  Subjective: Complaining of left hand pain, confrims that he declined TEE.  Assessment/Plan:  MRSA Bacteremia  -secondary to left index finger infection  -Patient was seen on 8/12 at [facility] for finger infection,blood Cultures 8/12 positive for Methicillin Resistant Staphylococcus Aureus. Unfortunately, patient refused admission and was sent home on Bactrim, he was later notified on blood culture results. Admitted to [facility] on 8/16, started on IV Vancomycin Day 3. Hand surgery consulted. Repeat Blood cultures 8/17, NGTD. TTE negative for vegetation, have consulted cards for TEE.  Left index finger infection  -as noted above, seen in [facility] ED on 8/12 and started on Bactrim. Patient refused admission then. After blood cultures came back positive patient was contacted. He was admitted to [facility] on 8/16. Hand surgery-Dr Apolonio Schneiders was consulted, underwent Incision and debridement on 8/16. Plans are to go back to the OR on Wednesday, 8/19, may require amputation. -Left finger tissue culture of a/16 at 7:51 with MRSA  -Continue current antibiotics with vancomycin per ID.it was noted  that Pt was already on bactrim for a few days as outpatient.  -prn narcotics for pain   Anxiety  -Home regimen Xanax TID as needed for anxiety   -Dr Sloan Leiter explained to patient in great detail that patient has MRSA bacteremia and the serious nature of the finger infection, and potential life threatening/disabling consequences of leaving the hospital before ready for discharge.  He voiced understanding and at continues to decline TEE at this time.   Disposition:  Remain inpatient   DVT Prophylaxis:  SCD's/encouraged ambulation  Code Status:  Full code   Family Communication  No family present   Procedures:  8/19 Left index finger I&D and probable amputation to DIP joint  CONSULTS:  ID and orthopedic surgery   MEDICATIONS: Scheduled Meds: . polyethylene glycol  17 g Oral Daily  . pregabalin  225 mg Oral BID  . vancomycin  1,250 mg Intravenous Q8H   Continuous Infusions:  PRN Meds:.acetaminophen, acetaminophen, ALPRAZolam, morphine injection, ondansetron (ZOFRAN) IV, ondansetron, oxyCODONE  Antibiotics: Anti-infectives   Start     Dose/Rate Route Frequency Ordered Stop   03/09/14 2115  vancomycin (VANCOCIN) IVPB 1000 mg/200 mL premix  Status:  Discontinued     1,000 mg 200 mL/hr over 60 Minutes Intravenous Every 8 hours 03/08/14 2103 03/09/14 1100   03/09/14 1200  vancomycin (VANCOCIN) 1,250 mg in sodium chloride 0.9 % 250 mL IVPB     1,250 mg 166.7 mL/hr over 90 Minutes Intravenous Every 8 hours 03/09/14 1100     03/08/14 0600  vancomycin (VANCOCIN) 1,500 mg in sodium chloride 0.9 % 500 mL IVPB     1,500 mg 250 mL/hr over 120 Minutes Intravenous On  call to O.R. 03/07/14 1635 03/07/14 1742   03/07/14 1800  vancomycin (VANCOCIN) IVPB 1000 mg/200 mL premix  Status:  Discontinued     1,000 mg 200 mL/hr over 60 Minutes Intravenous Every 8 hours 03/07/14 1656 03/08/14 2103       PHYSICAL EXAM: Vital signs in last 24 hours: Filed Vitals:   03/09/14 0655 03/09/14 1359  03/09/14 2144 03/10/14 0700  BP: 115/51 118/60 126/63 111/61  Pulse: 82 60 65 62  Temp: 98.8 F (37.1 C) 98.6 F (37 C) 98.3 F (36.8 C) 97.4 F (36.3 C)  TempSrc: Oral Oral Oral Oral  Resp: 16 18 15 20   Height:      Weight:      SpO2: 98% 96% 95% 95%   Weight change:  Filed Weights   03/07/14 1457 03/07/14 1631  Weight: 113.399 kg (250 lb) 113.399 kg (250 lb)   Body mass index is 28.9 kg/(m^2).   Gen Exam: Sleepy but easily aroused, oriented x3, lying in bed, cooperative Neck: Supple, No JVD.   Chest: B/L Clear.   CVS: S1 S2 Regular, no murmurs.  Abdomen: soft, BS +, non tender, non distended.  Extremities: no edema, lower extremities warm to touch. Neurologic: Non Focal.   Skin: No Rash.   Wounds:Left hand and forearm bandaged and in an ace wrap, tip with old bloody drainage. Able to move all fingers without difficulty.   LAB RESULTS: CBC  Recent Labs Lab 03/03/14 1845 03/06/14 2219 03/07/14 0637 03/08/14 0525 03/09/14 0546  WBC 12.6* 8.8 6.7 6.4 5.7  HGB 15.4 13.3 12.6* 11.3* 12.5*  HCT 43.9 36.9* 35.7* 33.1* 35.6*  PLT 206 207 176 172 215  MCV 91.6 89.1 90.2 91.2 90.6  MCH 32.2 32.1 31.8 31.1 31.8  MCHC 35.1 36.0 35.3 34.1 35.1  RDW 12.4 12.4 12.4 12.7 12.8  LYMPHSABS 1.9 2.0  --   --   --   MONOABS 1.1* 0.8  --   --   --   EOSABS 0.1 0.1  --   --   --   BASOSABS 0.0 0.0  --   --   --     Chemistries   Recent Labs Lab 03/03/14 1845 03/06/14 2312 03/07/14 0637 03/08/14 0525 03/09/14 0546  NA 138 132* 134* 137 140  K 3.8 3.7 4.1 4.2 3.7  CL 101 97 100 103 105  CO2 25 22 25 23 23   GLUCOSE 96 163* 132* 106* 114*  BUN 13 12 9 7 8   CREATININE 0.95 1.11 1.06 0.96 0.83  CALCIUM 9.5 8.8 8.3* 8.3* 8.8   GFR Estimated Creatinine Clearance: 179.4 ml/min (by C-G formula based on Cr of 0.83).  No results found for this basename: LIPASE, AMYLASE,  in the last 72 hours MICROBIOLOGY: Recent Results (from the past 240 hour(s))  CULTURE, BLOOD (ROUTINE  X 2)     Status: None   Collection Time    03/03/14  7:15 PM      Result Value Ref Range Status   Specimen Description BLOOD LEFT ARM   Final   Special Requests     Final   Value: BOTTLES DRAWN AEROBIC AND ANAEROBIC AEB=13CC ANA=12CC   Culture  Setup Time     Final   Value: 03/04/2014 20:33     Performed at [facility]   Culture     Final   Value: METHICILLIN RESISTANT STAPHYLOCOCCUS AUREUS     Note: SUSCEPTIBILITIES PERFORMED ON PREVIOUS CULTURE WITHIN THE LAST 5 DAYS.  Note: Gram Stain Report Called to,Read Back By and Verified With: GAMMONS S (FLOW MANAGER) AT 1318 ON 03/04/2014 BY Tyrone Schimke M     Performed at [facility]   Report Status 03/07/2014 FINAL   Final  CULTURE, BLOOD (ROUTINE X 2)     Status: None   Collection Time    03/03/14  7:18 PM      Result Value Ref Range Status   Specimen Description BLOOD RIGHT ARM   Final   Special Requests     Final   Value: BOTTLES DRAWN AEROBIC AND ANAEROBIC AEB=10CC ANA=12CC   Culture  Setup Time     Final   Value: 03/04/2014 22:46     Performed at [facility]   Culture     Final   Value: METHICILLIN RESISTANT STAPHYLOCOCCUS AUREUS     Note: This organism DOES NOT demonstrate inducible Clindamycin resistance in vitro.     Note: Gram Stain Report Called to,Read Back By and Verified With: GAMMONS S. AT 1318 ON 03/04/14 BY BAUGHAM M. Performed at [place], READ BACK BY AND VERIFIED WITH: [place] 03/07/14 @ 9:19PM BY RUSCOE A.     Performed at [facility]   Report Status 03/07/2014 FINAL   Final   Organism ID, Bacteria METHICILLIN RESISTANT STAPHYLOCOCCUS AUREUS   Final  SURGICAL PCR SCREEN     Status: None   Collection Time    03/07/14  4:45 PM      Result Value Ref Range Status   MRSA, PCR NEGATIVE  NEGATIVE Final   Staphylococcus aureus NEGATIVE  NEGATIVE Final   Comment:            The Xpert SA Assay (FDA     approved for NASAL specimens     in  patients over 18 years of age),     is one component of     a comprehensive surveillance     program.  Test performance has     been validated by [employer] for patients greater     than or equal to 18 year old.     It is not intended     to diagnose infection nor to     guide or monitor treatment.  TISSUE CULTURE     Status: None   Collection Time    03/07/14  5:51 PM      Result Value Ref Range Status   Specimen Description TISSUE LEFT FINGER   Final   Special Requests PATIENT ON FOLLOWING VANCO ZOSYN   Final   Gram Stain     Final   Value: NO WBC SEEN     NO SQUAMOUS EPITHELIAL CELLS SEEN     NO ORGANISMS SEEN     Performed at [facility]   Culture     Final   Value: RARE METHICILLIN RESISTANT STAPHYLOCOCCUS AUREUS     Note: RIFAMPIN AND GENTAMICIN SHOULD NOT BE USED AS SINGLE DRUGS FOR TREATMENT OF STAPH INFECTIONS. This organism DOES NOT demonstrate inducible Clindamycin resistance in vitro. CRITICAL RESULT CALLED TO, READ BACK BY AND VERIFIED WITH: LINDA BUNN @      9:13AM 03/10/14 BY DWEEKS     Performed at [facility]   Report Status 03/10/2014 FINAL   Final   Organism ID, Bacteria METHICILLIN RESISTANT STAPHYLOCOCCUS AUREUS   Final  ANAEROBIC CULTURE     Status: None   Collection Time  03/07/14  5:51 PM      Result Value Ref Range Status   Specimen Description TISSUE LEFT FINGER   Final   Special Requests PATIENT ON FOLLOWING VANCO ZOSYN   Final   Gram Stain     Final   Value: NO WBC SEEN     NO SQUAMOUS EPITHELIAL CELLS SEEN     NO ORGANISMS SEEN     Performed at [facility]   Culture     Final   Value: NO ANAEROBES ISOLATED; CULTURE IN PROGRESS FOR 5 DAYS     Performed at [facility]   Report Status PENDING   Incomplete  ANAEROBIC CULTURE     Status: None   Collection Time    03/07/14  5:56 PM      Result Value Ref Range Status   Specimen Description TISSUE FINGER LEFT   Final   Special Requests     Final   Value:  LEFT INDEX FINGER PT ON VANCOMYCIN AND ZOSYN NO 2 TISSUE   Gram Stain     Final   Value: FEW WBC PRESENT,BOTH PMN AND MONONUCLEAR     RARE SQUAMOUS EPITHELIAL CELLS PRESENT     NO ORGANISMS SEEN     Performed at [facility]   Culture     Final   Value: NO ANAEROBES ISOLATED; CULTURE IN PROGRESS FOR 5 DAYS     Performed at [facility]   Report Status PENDING   Incomplete  TISSUE CULTURE     Status: None   Collection Time    03/07/14  5:56 PM      Result Value Ref Range Status   Specimen Description TISSUE FINGER LEFT   Final   Special Requests     Final   Value: LEFT INDEX FINGER PT ON VANCOMYCIN AND ZOSYN NO 2 TISSUE   Gram Stain     Final   Value: FEW WBC PRESENT,BOTH PMN AND MONONUCLEAR     RARE SQUAMOUS EPITHELIAL CELLS PRESENT     NO ORGANISMS SEEN     Performed at [facility]   Culture     Final   Value: FEW STAPHYLOCOCCUS AUREUS     Note: RIFAMPIN AND GENTAMICIN SHOULD NOT BE USED AS SINGLE DRUGS FOR TREATMENT OF STAPH INFECTIONS. SUSCEPTIBILITIES PERFORMED ON PREVIOUS CULTURE WITHIN THE LAST 5 DAYS. CRITICAL RESULT CALLED TO, READ BACK BY AND VERIFIED WITH: LINDA BUNN @ 9:13AM      03/10/14 BY DWEEKS     Performed at [facility]   Report Status 03/10/2014 FINAL   Final  TISSUE CULTURE     Status: None   Collection Time    03/07/14  5:56 PM      Result Value Ref Range Status   Specimen Description TISSUE FINGER LEFT BONE   Final   Special Requests     Final   Value: NO 3 BONE LEFT INDEX FINGER PT ON VANCOMYCIN AND ZOSYN   Gram Stain     Final   Value: RARE WBC PRESENT, PREDOMINANTLY MONONUCLEAR     NO SQUAMOUS EPITHELIAL CELLS SEEN     RARE GRAM POSITIVE COCCI     IN PAIRS     Performed at [facility]   Culture     Final   Value: FEW STAPHYLOCOCCUS AUREUS     Note: RIFAMPIN AND GENTAMICIN SHOULD NOT BE USED AS SINGLE DRUGS FOR TREATMENT OF STAPH INFECTIONS. SUSCEPTIBILITIES PERFORMED ON PREVIOUS CULTURE WITHIN  THE LAST 5  DAYS. CRITICAL RESULT CALLED TO, READ BACK BY AND VERIFIED WITH: LINDA BUNN @ 9:13AM      03/10/14 BY DWEEKS     Performed at [facility]   Report Status 03/10/2014 FINAL   Final  ANAEROBIC CULTURE     Status: None   Collection Time    03/07/14  5:56 PM      Result Value Ref Range Status   Specimen Description TISSUE FINGER LEFT BONE   Final   Special Requests     Final   Value: NO 3 BONE LEFT INDEX FINGER PT ON VANCOMYCIN AND ZOSYN   Gram Stain     Final   Value: RARE WBC PRESENT, PREDOMINANTLY MONONUCLEAR     NO SQUAMOUS EPITHELIAL CELLS SEEN     RARE GRAM POSITIVE COCCI     IN PAIRS     Performed at [facility]   Culture     Final   Value: NO ANAEROBES ISOLATED; CULTURE IN PROGRESS FOR 5 DAYS     Performed at [facility]   Report Status PENDING   Incomplete  CULTURE, BLOOD (ROUTINE X 2)     Status: None   Collection Time    03/08/14  8:11 AM      Result Value Ref Range Status   Specimen Description BLOOD RIGHT ANTECUBITAL   Final   Special Requests BOTTLES DRAWN AEROBIC AND ANAEROBIC 10CC   Final   Culture  Setup Time     Final   Value: 03/08/2014 15:07     Performed at [facility]   Culture     Final   Value:        BLOOD CULTURE RECEIVED NO GROWTH TO DATE CULTURE WILL BE HELD FOR 5 DAYS BEFORE ISSUING A FINAL NEGATIVE REPORT     Performed at [facility]   Report Status PENDING   Incomplete  CULTURE, BLOOD (ROUTINE X 2)     Status: None   Collection Time    03/08/14  8:18 AM      Result Value Ref Range Status   Specimen Description BLOOD LEFT ANTECUBITAL   Final   Special Requests BOTTLES DRAWN AEROBIC AND ANAEROBIC 10CC   Final   Culture  Setup Time     Final   Value: 03/08/2014 15:07     Performed at [facility]   Culture     Final   Value:        BLOOD CULTURE RECEIVED NO GROWTH TO DATE CULTURE WILL BE HELD FOR 5 DAYS BEFORE ISSUING A FINAL NEGATIVE REPORT     Performed at [facility]   Report Status  PENDING   Incomplete    RADIOLOGY STUDIES/RESULTS: Dg Hand Complete Left 03/06/2014   CLINICAL DATA:  Pain, swelling and erythema at a wound between the index and middle fingers of the left hand. Purulent discharge.  EXAM: LEFT HAND - COMPLETE 3+ VIEW  COMPARISON:  None.  FINDINGS: The known soft tissue wound is not well characterized. There is diffuse soft tissue swelling about the second and third metacarpophalangeal joints, and along the second finger. No radiopaque foreign bodies are seen. There is no evidence of osseous erosion at this time.  Visualized joint spaces are preserved. The carpal rows appear grossly intact, and demonstrate normal alignment. 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  IMPRESSION: 1. Known soft  tissue wound is not well characterized. No radiopaque foreign bodies seen. Diffuse soft tissue swelling about the second and third metacarpophalangeal joints, and along the second finger. 2. No evidence of osseous erosion at this time. 3. 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   Electronically Signed   By: Garald Balding M.D.   On: 03/06/2014 22:18    Minette Headland MD [facility] Pager:[phone]  If 7PM-7AM, please contact night-coverage [email or web address] Password [email or web address] 03/10/2014, 11:38 AM   LOS: 3 days

## 2014-03-10 NOTE — Progress Notes (Addendum)
Patient wishes to go forward with TEE. Patient's wife requesting more information about why patient needs procedure. Cardiology, ID and attending contacted. MD unable to see patient at this time. Will follow up in rounds.

## 2014-03-10 NOTE — Progress Notes (Signed)
Report given to RN Sharyn Lull in [place]

## 2014-03-10 NOTE — Progress Notes (Signed)
[facility] for Infectious Disease  Date of Admission:  03/07/2014  Antibiotics: vancomycin  Subjective: Patient currently is agitated with being NPO for surgery, refusing all care.  Refused TEE.  Yelling that he is going to "get up out of here"  Objective: Temp:  [97.4 F (36.3 C)-98.6 F (37 C)] 97.4 F (36.3 C) (08/19 0700) Pulse Rate:  [60-65] 62 (08/19 0700) Resp:  [15-20] 20 (08/19 0700) BP: (111-126)/(60-63) 111/61 mmHg (08/19 0700) SpO2:  [95 %-96 %] 95 % (08/19 0700)  General: unable to examine  Lab Results Lab Results  Component Value Date   WBC 5.7 03/09/2014   HGB 12.5* 03/09/2014   HCT 35.6* 03/09/2014   MCV 90.6 03/09/2014   PLT 215 03/09/2014    Lab Results  Component Value Date   CREATININE 0.83 03/09/2014   BUN 8 03/09/2014   NA 140 03/09/2014   K 3.7 03/09/2014   CL 105 03/09/2014   CO2 23 03/09/2014    Lab Results  Component Value Date   ALT 113* 03/08/2014   AST 106* 03/08/2014   ALKPHOS 307* 03/08/2014   BILITOT 0.9 03/08/2014      Microbiology: Recent Results (from the past 240 hour(s))  CULTURE, BLOOD (ROUTINE X 2)     Status: None   Collection Time    03/03/14  7:15 PM      Result Value Ref Range Status   Specimen Description BLOOD LEFT ARM   Final   Special Requests     Final   Value: BOTTLES DRAWN AEROBIC AND ANAEROBIC AEB=13CC ANA=12CC   Culture  Setup Time     Final   Value: 03/04/2014 20:33     Performed at [facility]   Culture     Final   Value: METHICILLIN RESISTANT STAPHYLOCOCCUS AUREUS     Note: SUSCEPTIBILITIES PERFORMED ON PREVIOUS CULTURE WITHIN THE LAST 5 DAYS.     Note: Gram Stain Report Called to,Read Back By and Verified With: GAMMONS S (FLOW MANAGER) AT 1318 ON 03/04/2014 BY Tyrone Schimke M     Performed at [facility]   Report Status 03/07/2014 FINAL   Final  CULTURE, BLOOD (ROUTINE X 2)     Status: None   Collection Time    03/03/14  7:18 PM      Result Value Ref Range Status   Specimen Description  BLOOD RIGHT ARM   Final   Special Requests     Final   Value: BOTTLES DRAWN AEROBIC AND ANAEROBIC AEB=10CC ANA=12CC   Culture  Setup Time     Final   Value: 03/04/2014 22:46     Performed at [facility]   Culture     Final   Value: METHICILLIN RESISTANT STAPHYLOCOCCUS AUREUS     Note: This organism DOES NOT demonstrate inducible Clindamycin resistance in vitro.     Note: Gram Stain Report Called to,Read Back By and Verified With: GAMMONS S. AT 1318 ON 03/04/14 BY BAUGHAM M. Performed at [facility], READ BACK BY AND VERIFIED WITH: [place] 03/07/14 @ 9:19PM BY RUSCOE A.     Performed at [facility]   Report Status 03/07/2014 FINAL   Final   Organism ID, Bacteria METHICILLIN RESISTANT STAPHYLOCOCCUS AUREUS   Final  SURGICAL PCR SCREEN     Status: None   Collection Time    03/07/14  4:45 PM      Result Value Ref Range Status   MRSA,  PCR NEGATIVE  NEGATIVE Final   Staphylococcus aureus NEGATIVE  NEGATIVE Final   Comment:            The Xpert SA Assay (FDA     approved for NASAL specimens     in patients over 26 years of age),     is one component of     a comprehensive surveillance     program.  Test performance has     been validated by [employer] for patients greater     than or equal to 64 year old.     It is not intended     to diagnose infection nor to     guide or monitor treatment.  TISSUE CULTURE     Status: None   Collection Time    03/07/14  5:51 PM      Result Value Ref Range Status   Specimen Description TISSUE LEFT FINGER   Final   Special Requests PATIENT ON FOLLOWING VANCO ZOSYN   Final   Gram Stain     Final   Value: NO WBC SEEN     NO SQUAMOUS EPITHELIAL CELLS SEEN     NO ORGANISMS SEEN     Performed at [facility]   Culture     Final   Value: RARE METHICILLIN RESISTANT STAPHYLOCOCCUS AUREUS     Note: RIFAMPIN AND GENTAMICIN SHOULD NOT BE USED AS SINGLE DRUGS FOR TREATMENT OF STAPH  INFECTIONS. This organism DOES NOT demonstrate inducible Clindamycin resistance in vitro. CRITICAL RESULT CALLED TO, READ BACK BY AND VERIFIED WITH: LINDA BUNN @      9:13AM 03/10/14 BY DWEEKS     Performed at [facility]   Report Status 03/10/2014 FINAL   Final   Organism ID, Bacteria METHICILLIN RESISTANT STAPHYLOCOCCUS AUREUS   Final  ANAEROBIC CULTURE     Status: None   Collection Time    03/07/14  5:51 PM      Result Value Ref Range Status   Specimen Description TISSUE LEFT FINGER   Final   Special Requests PATIENT ON FOLLOWING VANCO ZOSYN   Final   Gram Stain     Final   Value: NO WBC SEEN     NO SQUAMOUS EPITHELIAL CELLS SEEN     NO ORGANISMS SEEN     Performed at [facility]   Culture     Final   Value: NO ANAEROBES ISOLATED; CULTURE IN PROGRESS FOR 5 DAYS     Performed at [facility]   Report Status PENDING   Incomplete  ANAEROBIC CULTURE     Status: None   Collection Time    03/07/14  5:56 PM      Result Value Ref Range Status   Specimen Description TISSUE FINGER LEFT   Final   Special Requests     Final   Value: LEFT INDEX FINGER PT ON VANCOMYCIN AND ZOSYN NO 2 TISSUE   Gram Stain     Final   Value: FEW WBC PRESENT,BOTH PMN AND MONONUCLEAR     RARE SQUAMOUS EPITHELIAL CELLS PRESENT     NO ORGANISMS SEEN     Performed at [facility]   Culture     Final   Value: NO ANAEROBES ISOLATED; CULTURE IN PROGRESS FOR 5 DAYS     Performed at [facility]   Report Status PENDING   Incomplete  TISSUE CULTURE     Status: None  Collection Time    03/07/14  5:56 PM      Result Value Ref Range Status   Specimen Description TISSUE FINGER LEFT   Final   Special Requests     Final   Value: LEFT INDEX FINGER PT ON VANCOMYCIN AND ZOSYN NO 2 TISSUE   Gram Stain     Final   Value: FEW WBC PRESENT,BOTH PMN AND MONONUCLEAR     RARE SQUAMOUS EPITHELIAL CELLS PRESENT     NO ORGANISMS SEEN     Performed at [facility]   Culture      Final   Value: FEW STAPHYLOCOCCUS AUREUS     Note: RIFAMPIN AND GENTAMICIN SHOULD NOT BE USED AS SINGLE DRUGS FOR TREATMENT OF STAPH INFECTIONS. SUSCEPTIBILITIES PERFORMED ON PREVIOUS CULTURE WITHIN THE LAST 5 DAYS. CRITICAL RESULT CALLED TO, READ BACK BY AND VERIFIED WITH: LINDA BUNN @ 9:13AM      03/10/14 BY DWEEKS     Performed at [facility]   Report Status 03/10/2014 FINAL   Final  TISSUE CULTURE     Status: None   Collection Time    03/07/14  5:56 PM      Result Value Ref Range Status   Specimen Description TISSUE FINGER LEFT BONE   Final   Special Requests     Final   Value: NO 3 BONE LEFT INDEX FINGER PT ON VANCOMYCIN AND ZOSYN   Gram Stain     Final   Value: RARE WBC PRESENT, PREDOMINANTLY MONONUCLEAR     NO SQUAMOUS EPITHELIAL CELLS SEEN     RARE GRAM POSITIVE COCCI     IN PAIRS     Performed at [facility]   Culture     Final   Value: FEW STAPHYLOCOCCUS AUREUS     Note: RIFAMPIN AND GENTAMICIN SHOULD NOT BE USED AS SINGLE DRUGS FOR TREATMENT OF STAPH INFECTIONS. SUSCEPTIBILITIES PERFORMED ON PREVIOUS CULTURE WITHIN THE LAST 5 DAYS. CRITICAL RESULT CALLED TO, READ BACK BY AND VERIFIED WITH: LINDA BUNN @ 9:13AM      03/10/14 BY DWEEKS     Performed at [facility]   Report Status 03/10/2014 FINAL   Final  ANAEROBIC CULTURE     Status: None   Collection Time    03/07/14  5:56 PM      Result Value Ref Range Status   Specimen Description TISSUE FINGER LEFT BONE   Final   Special Requests     Final   Value: NO 3 BONE LEFT INDEX FINGER PT ON VANCOMYCIN AND ZOSYN   Gram Stain     Final   Value: RARE WBC PRESENT, PREDOMINANTLY MONONUCLEAR     NO SQUAMOUS EPITHELIAL CELLS SEEN     RARE GRAM POSITIVE COCCI     IN PAIRS     Performed at [facility]   Culture     Final   Value: NO ANAEROBES ISOLATED; CULTURE IN PROGRESS FOR 5 DAYS     Performed at [facility]   Report Status PENDING   Incomplete  CULTURE, BLOOD (ROUTINE X 2)      Status: None   Collection Time    03/08/14  8:11 AM      Result Value Ref Range Status   Specimen Description BLOOD RIGHT ANTECUBITAL   Final   Special Requests BOTTLES DRAWN AEROBIC AND ANAEROBIC 10CC   Final   Culture  Setup Time     Final   Value: 03/08/2014 15:07  Performed at [facility]     Final   Value:        BLOOD CULTURE RECEIVED NO GROWTH TO DATE CULTURE WILL BE HELD FOR 5 DAYS BEFORE ISSUING A FINAL NEGATIVE REPORT     Performed at [facility]   Report Status PENDING   Incomplete  CULTURE, BLOOD (ROUTINE X 2)     Status: None   Collection Time    03/08/14  8:18 AM      Result Value Ref Range Status   Specimen Description BLOOD LEFT ANTECUBITAL   Final   Special Requests BOTTLES DRAWN AEROBIC AND ANAEROBIC 10CC   Final   Culture  Setup Time     Final   Value: 03/08/2014 15:07     Performed at [facility]   Culture     Final   Value:        BLOOD CULTURE RECEIVED NO GROWTH TO DATE CULTURE WILL BE HELD FOR 5 DAYS BEFORE ISSUING A FINAL NEGATIVE REPORT     Performed at [facility]   Report Status PENDING   Incomplete    Studies/Results: No results found.  Assessment/Plan: 1) MRSA bacteremia - likely finger as source.  Refused TEE so I recommend 6 weeks of IV antibiotics with vancomycin.  Not sure if pt is agreeable with anything.  I verbalized risks of not complying.  Patient continued yelling.  Ok to put in picc line with blood culture negative at 48 hours if patient agreeable Antibiotics through Sept 27th if no TEE done  He can follow up in [facility] if needed/ Please call for any changes, otherwise I will sign off; thanks  Scharlene Gloss, [place] [facility] Infectious Disease [place] [email or web address] [phone] pager   [phone] cell 03/10/2014, 12:47 PM

## 2014-03-10 NOTE — Progress Notes (Signed)
MD Viyouh notified via text paged that one of pt's tissue culture grew MRSA. No new orders at this time.

## 2014-03-10 NOTE — Anesthesia Preprocedure Evaluation (Signed)
Anesthesia Evaluation  Patient identified by MRN, date of birth, ID band Patient awake    Reviewed: Allergy & Precautions, H&P , NPO status , Patient's Chart, lab work & pertinent test results  Airway Mallampati: I TM Distance: >3 FB Neck ROM: Full    Dental no notable dental hx. (+) Dental Advisory Given, Poor Dentition   Pulmonary sleep apnea , Current Smoker,  breath sounds clear to auscultation  Pulmonary exam normal       Cardiovascular negative cardio ROS  Rhythm:Regular Rate:Normal     Neuro/Psych negative neurological ROS  negative psych ROS   GI/Hepatic Elevated liver enzymes H/o pancreatitis   Endo/Other  negative endocrine ROS  Renal/GU negative Renal ROS  negative genitourinary   Musculoskeletal   Abdominal   Peds  Hematology negative hematology ROS (+)   Anesthesia Other Findings   Reproductive/Obstetrics negative OB ROS                           Anesthesia Physical  Anesthesia Plan  ASA: II  Anesthesia Plan: General   Post-op Pain Management:    Induction: Intravenous  Airway Management Planned:   Additional Equipment:   Intra-op Plan:   Post-operative Plan: Extubation in OR  Informed Consent: I have reviewed the patients History and Physical, chart, labs and discussed the procedure including the risks, benefits and alternatives for the proposed anesthesia with the patient or authorized representative who has indicated his/her understanding and acceptance.   Dental advisory given  Plan Discussed with: CRNA  Anesthesia Plan Comments:         Anesthesia Quick Evaluation

## 2014-03-10 NOTE — Discharge Instructions (Signed)
KEEP BANDAGE CLEAN AND DRY °CALL OFFICE FOR F/U APPT [phone] in 8 days °KEEP HAND ELEVATED ABOVE HEART °OK TO APPLY ICE TO OPERATIVE AREA °CONTACT OFFICE IF ANY WORSENING PAIN OR CONCERNS. °

## 2014-03-10 NOTE — Transfer of Care (Signed)
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

## 2014-03-10 NOTE — ED Provider Notes (Signed)
Medical screening examination/treatment/procedure(s) were performed by non-physician practitioner and as supervising physician I was immediately available for consultation/collaboration.  Leota Jacobsen, MD 03/10/14 1000

## 2014-03-10 NOTE — Anesthesia Procedure Notes (Signed)
Procedure Name: LMA Insertion Date/Time: 03/10/2014 5:04 PM Performed by: Susa Loffler Pre-anesthesia Checklist: Patient identified, Timeout performed, Emergency Drugs available, Suction available and Patient being monitored Patient Re-evaluated:Patient Re-evaluated prior to inductionOxygen Delivery Method: Circle system utilized Preoxygenation: Pre-oxygenation with 100% oxygen Intubation Type: IV induction LMA: LMA inserted LMA Size: 5.0 Number of attempts: 1 Placement Confirmation: positive ETCO2 and breath sounds checked- equal and bilateral Tube secured with: Tape Dental Injury: Teeth and Oropharynx as per pre-operative assessment

## 2014-03-10 NOTE — Progress Notes (Addendum)
MD Comer at bedside with recommendations. Patient became upset and started yelling. RN calmed patient down by having him take a few deep breaths. Pt agreed to do TEE. Attempted to reach Cardiology [facility]) of patient's request. MD Marlou Porch on unit and told RN that he will set up TEE with Trish.

## 2014-03-11 ENCOUNTER — Encounter (HOSPITAL_COMMUNITY): Payer: Self-pay | Admitting: Orthopedic Surgery

## 2014-03-11 ENCOUNTER — Encounter (HOSPITAL_COMMUNITY): Discharge status: Against Medical Advice | Payer: Self-pay | Source: Intra-hospital | Attending: Internal Medicine

## 2014-03-11 SURGERY — ECHOCARDIOGRAM, TRANSESOPHAGEAL
Anesthesia: Moderate Sedation

## 2014-03-11 MED ORDER — SODIUM CHLORIDE 0.9 % IV SOLN: INTRAVENOUS | Status: DC

## 2014-03-11 NOTE — Progress Notes (Signed)
Patient advised by MD and RN about leaving the hospital against medical advice. AMA paperwork signed. IV taken out clean, dry and intact. Dressing in place.

## 2014-03-11 NOTE — Anesthesia Postprocedure Evaluation (Signed)
  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

## 2014-03-11 NOTE — Progress Notes (Signed)
Patient wanting to leave AMA stating, " I do want to sign that paper work and leave". RN attempted to calm patient and provide education about plan of care and why the doctors ordered a TEE for him. Patient still refusing and wanting to leave. Patient requesting paperwork from this admission. MD notified and aware.   9:51 AM MD accompanied by RN and provided education to patient about plan of care. Patient still refusing the standard and plan of care.

## 2014-03-11 NOTE — Discharge Summary (Signed)
Physician Discharge Summary  Benjamin Carter WIO:[id number] DOB: 1980/08/09 DOA: 03/07/2014  PCP: Neale Burly, MD  Admit date: 03/07/2014 Discharge date: 03/11/2014  Time spent: <30 minutes  Recommendations for Outpatient Follow-up:  Patient left against medical advised>> stated he has followup appointment with his PCP, and Dr. Caralyn Guile in one week as directed In another 2-3 weeks depending on wound healing. Discharge Diagnoses:  Principal Problem:   Bacteremia Active Problems:   Anxiety   Finger infection   Discharge Condition: Stable, left AGAINST MEDICAL ADVICE  Diet recommendation: Regular  Filed Weights   03/07/14 1457 03/07/14 1631  Weight: 113.399 kg (250 lb) 113.399 kg (250 lb)    History of present illness:   Patient is a 33 year old white male with no significant past medical history whowas seen on 8/12 at [facility] for left index finger infection, he refused admission and was discharged home on oral Bactrim. Blood cultures were obtained on 8/12 at [facility] emergency department. Subsequently blood cultures came back positive for MRSA, patient was then contacted, and admitted to [facility] on 8/16. 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 Infectious disease was consulted and followed patient during this hospital stay.   Hospital Course:  MRSA Bacteremia  -secondary to left index finger infection  -Patient was seen on 8/12 at [facility] for finger infection,blood Cultures 8/12 positive for Methicillin Resistant Staphylococcus Aureus. Unfortunately, patient refused admission and was sent home on Bactrim, he was later notified on blood culture results. Admitted to [facility] on 8/16, started on IV Vancomycin Day 3. Hand surgery was consulted and took patient to OR on 8/16 and 8/19. Repeat Blood cultures  8/17, NGTD. TTE negative for vegetation. Cardiology was consulted for TEE as recommended per infectious disease>> patient declined TEE despite all examination/education as to why it was needed and as to the risks of not having it done. On followup today 8/20 I updated patient and wife on the infectious disease recommendations included the fact that since he was still not willing to have the TEE done the recommendation was for him to be treated with IV antibiotics for 6 weeks (from 8/17), and at that this done he would require a PICC line which would be placed today prior to discharge. His wife seemed to understand and encouraged him to have the PICC line placed but he refused and signed his AMA papers. He states that he'll followup with his PCP. Left index finger infection  -as noted above, seen in [facility] ED on 8/12 and started on Bactrim. Patient refused admission then. After blood cultures came back positive patient was contacted. He was admitted to [facility] on 8/16. Hand surgery-Dr Apolonio Schneiders was consulted, underwent Incision and debridement on 8/16. Plans are to go back to the OR on Wednesday, 8/19, may require amputation.  -Left finger tissue culture of a/16 at 7:51 with MRSA  -Continue current antibiotics with vancomycin per ID.it was noted that Pt was already on bactrim for a few days as outpatient.  -He was on prn narcotics for pain as discussed above his left AMA and is to followup with his PCP., Anxiety  - continue Home regimen Xanax TID as needed for anxiety     -Dr Sloan Leiter explained to patient in great detail that patient has MRSA bacteremia and the serious nature of the finger infection, and  potential life threatening/disabling consequences of leaving the hospital before ready for discharge. He voiced understanding and at continues to decline TEE at this time.    Disposition:  Remain inpatient  DVT Prophylaxis:  SCD's/encouraged ambulation  Code Status:  Full code  Family Communication   No family present    Procedures:  Irrigation and debridement of left index finger on 8/16  Irrigation, debridement and revision of amputation of left index finger on 8/19  Consultations:  Infectious disease  Orthopedics-Dr. Caralyn Guile  Discharge Exam: Filed Vitals:   03/11/14 0637  BP: 120/73  Pulse: 56  Temp: 98.2 F (36.8 C)  Resp: 18      Discharge Instructions You were cared for by a hospitalist during your hospital stay. If you have any questions about your discharge medications or the care you received while you were in the hospital after you are discharged, you can call the unit and asked to speak with the hospitalist on call if the hospitalist that took care of you is not available. Once you are discharged, your primary care physician will handle any further medical issues. Please note that NO REFILLS for any discharge medications will be authorized once you are discharged, as it is imperative that you return to your primary care physician (or establish a relationship with a primary care physician if you do not have one) for your aftercare needs so that they can reassess your need for medications and monitor your lab values.     Medication List    ASK your doctor about these medications       ALPRAZolam 1 MG tablet  Commonly known as:  XANAX  Take 1 mg by mouth 3 (three) times daily as needed for anxiety.     pregabalin 225 MG capsule  Commonly known as:  LYRICA  Take 225 mg by mouth 2 (two) times daily.     vancomycin 250 MG capsule  Commonly known as:  VANCOCIN  Take 500 mg by mouth 2 (two) times daily.       Allergies  Allergen Reactions  . Bactrim [Sulfamethoxazole-Tmp Ds]     Caused pancreatitis  . Sulfites     Caused elevated liver enzymes  . Toradol [Ketorolac Tromethamine] Rash       Follow-up Information   Follow up with Linna Hoff, MD In 1 week.   Specialty:  Orthopedic Surgery   Contact information:   [address] [place]  [address] [place] [address] [phone]        The results of significant diagnostics from this hospitalization (including imaging, microbiology, ancillary and laboratory) are listed below for reference.    Significant Diagnostic Studies: Dg Hand Complete Left  03/06/2014   CLINICAL DATA:  Pain, swelling and erythema at a wound between the index and middle fingers of the left hand. Purulent discharge.  EXAM: LEFT HAND - COMPLETE 3+ VIEW  COMPARISON:  None.  FINDINGS: The known soft tissue wound is not well characterized. There is diffuse soft tissue swelling about the second and third metacarpophalangeal joints, and along the second finger. No radiopaque foreign bodies are seen. There is no evidence of osseous erosion at this time.  Visualized joint spaces are preserved. The carpal rows appear grossly intact, and demonstrate normal alignment. 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  IMPRESSION: 1. Known soft tissue wound is not well characterized. No radiopaque foreign bodies seen. Diffuse soft tissue swelling about  the second and third metacarpophalangeal joints, and along the second finger. 2. No evidence of osseous erosion at this time. 3. 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   Electronically Signed   By: Garald Balding M.D.   On: 03/06/2014 22:18    Microbiology: Recent Results (from the past 240 hour(s))  CULTURE, BLOOD (ROUTINE X 2)     Status: None   Collection Time    03/03/14  7:15 PM      Result Value Ref Range Status   Specimen Description BLOOD LEFT ARM   Final   Special Requests     Final   Value: BOTTLES DRAWN AEROBIC AND ANAEROBIC AEB=13CC ANA=12CC   Culture  Setup Time     Final   Value: 03/04/2014 20:33     Performed at [facility]   Culture     Final   Value: METHICILLIN RESISTANT STAPHYLOCOCCUS AUREUS     Note:  SUSCEPTIBILITIES PERFORMED ON PREVIOUS CULTURE WITHIN THE LAST 5 DAYS.     Note: Gram Stain Report Called to,Read Back By and Verified With: GAMMONS S (FLOW MANAGER) AT 1318 ON 03/04/2014 BY Tyrone Schimke M     Performed at [facility]   Report Status 03/07/2014 FINAL   Final  CULTURE, BLOOD (ROUTINE X 2)     Status: None   Collection Time    03/03/14  7:18 PM      Result Value Ref Range Status   Specimen Description BLOOD RIGHT ARM   Final   Special Requests     Final   Value: BOTTLES DRAWN AEROBIC AND ANAEROBIC AEB=10CC ANA=12CC   Culture  Setup Time     Final   Value: 03/04/2014 22:46     Performed at [facility]   Culture     Final   Value: METHICILLIN RESISTANT STAPHYLOCOCCUS AUREUS     Note: This organism DOES NOT demonstrate inducible Clindamycin resistance in vitro.     Note: Gram Stain Report Called to,Read Back By and Verified With: GAMMONS S. AT 1318 ON 03/04/14 BY BAUGHAM M. Performed at [place], READ BACK BY AND VERIFIED WITH: [place] 03/07/14 @ 9:19PM BY RUSCOE A.     Performed at [facility]   Report Status 03/07/2014 FINAL   Final   Organism ID, Bacteria METHICILLIN RESISTANT STAPHYLOCOCCUS AUREUS   Final  SURGICAL PCR SCREEN     Status: None   Collection Time    03/07/14  4:45 PM      Result Value Ref Range Status   MRSA, PCR NEGATIVE  NEGATIVE Final   Staphylococcus aureus NEGATIVE  NEGATIVE Final   Comment:            The Xpert SA Assay (FDA     approved for NASAL specimens     in patients over 46 years of age),     is one component of     a comprehensive surveillance     program.  Test performance has     been validated by [employer] for patients greater     than or equal to 19 year old.     It is not intended     to diagnose infection nor to     guide or monitor treatment.  TISSUE CULTURE     Status: None   Collection Time    03/07/14  5:51 PM      Result Value Ref Range Status    Specimen Description TISSUE LEFT FINGER   Final   Special Requests PATIENT ON FOLLOWING VANCO ZOSYN   Final   Gram Stain     Final   Value: NO WBC SEEN     NO SQUAMOUS EPITHELIAL CELLS SEEN     NO ORGANISMS SEEN     Performed at [facility]   Culture     Final   Value: RARE METHICILLIN RESISTANT STAPHYLOCOCCUS AUREUS     Note: RIFAMPIN AND GENTAMICIN SHOULD NOT BE USED AS SINGLE DRUGS FOR TREATMENT OF STAPH INFECTIONS. This organism DOES NOT demonstrate inducible Clindamycin resistance in vitro. CRITICAL RESULT CALLED TO, READ BACK BY AND VERIFIED WITH: LINDA BUNN @      9:13AM 03/10/14 BY DWEEKS     Performed at [facility]   Report Status 03/10/2014 FINAL   Final   Organism ID, Bacteria METHICILLIN RESISTANT STAPHYLOCOCCUS AUREUS   Final  ANAEROBIC CULTURE     Status: None   Collection Time    03/07/14  5:51 PM      Result Value Ref Range Status   Specimen Description TISSUE LEFT FINGER   Final   Special Requests PATIENT ON FOLLOWING VANCO ZOSYN   Final   Gram Stain     Final   Value: NO WBC SEEN     NO SQUAMOUS EPITHELIAL CELLS SEEN     NO ORGANISMS SEEN     Performed at [facility]   Culture     Final   Value: NO ANAEROBES ISOLATED; CULTURE IN PROGRESS FOR 5 DAYS     Performed at [facility]   Report Status PENDING   Incomplete  ANAEROBIC CULTURE     Status: None   Collection Time    03/07/14  5:56 PM      Result Value Ref Range Status   Specimen Description TISSUE FINGER LEFT   Final   Special Requests     Final   Value: LEFT INDEX FINGER PT ON VANCOMYCIN AND ZOSYN NO 2 TISSUE   Gram Stain     Final   Value: FEW WBC PRESENT,BOTH PMN AND MONONUCLEAR     RARE SQUAMOUS EPITHELIAL CELLS PRESENT     NO ORGANISMS SEEN     Performed at [facility]   Culture     Final   Value: NO ANAEROBES ISOLATED; CULTURE IN PROGRESS FOR 5 DAYS     Performed at [facility]   Report Status PENDING   Incomplete  TISSUE CULTURE     Status:  None   Collection Time    03/07/14  5:56 PM      Result Value Ref Range Status   Specimen Description TISSUE FINGER LEFT   Final   Special Requests     Final   Value: LEFT INDEX FINGER PT ON VANCOMYCIN AND ZOSYN NO 2 TISSUE   Gram Stain     Final   Value: FEW WBC PRESENT,BOTH PMN AND MONONUCLEAR     RARE SQUAMOUS EPITHELIAL CELLS PRESENT     NO ORGANISMS SEEN     Performed at [facility]   Culture     Final   Value: FEW STAPHYLOCOCCUS AUREUS     Note: RIFAMPIN AND GENTAMICIN SHOULD NOT BE USED AS SINGLE DRUGS FOR TREATMENT OF STAPH INFECTIONS. SUSCEPTIBILITIES PERFORMED ON PREVIOUS CULTURE WITHIN THE LAST 5 DAYS. CRITICAL RESULT CALLED TO,  READ BACK BY AND VERIFIED WITH: Wendelyn Breslow @ 9:13AM      03/10/14 BY DWEEKS     Performed at [facility]   Report Status 03/10/2014 FINAL   Final  TISSUE CULTURE     Status: None   Collection Time    03/07/14  5:56 PM      Result Value Ref Range Status   Specimen Description TISSUE FINGER LEFT BONE   Final   Special Requests     Final   Value: NO 3 BONE LEFT INDEX FINGER PT ON VANCOMYCIN AND ZOSYN   Gram Stain     Final   Value: RARE WBC PRESENT, PREDOMINANTLY MONONUCLEAR     NO SQUAMOUS EPITHELIAL CELLS SEEN     RARE GRAM POSITIVE COCCI     IN PAIRS     Performed at [facility]   Culture     Final   Value: FEW STAPHYLOCOCCUS AUREUS     Note: RIFAMPIN AND GENTAMICIN SHOULD NOT BE USED AS SINGLE DRUGS FOR TREATMENT OF STAPH INFECTIONS. SUSCEPTIBILITIES PERFORMED ON PREVIOUS CULTURE WITHIN THE LAST 5 DAYS. CRITICAL RESULT CALLED TO, READ BACK BY AND VERIFIED WITH: LINDA BUNN @ 9:13AM      03/10/14 BY DWEEKS     Performed at [facility]   Report Status 03/10/2014 FINAL   Final  ANAEROBIC CULTURE     Status: None   Collection Time    03/07/14  5:56 PM      Result Value Ref Range Status   Specimen Description TISSUE FINGER LEFT BONE   Final   Special Requests     Final   Value: NO 3 BONE LEFT INDEX FINGER PT  ON VANCOMYCIN AND ZOSYN   Gram Stain     Final   Value: RARE WBC PRESENT, PREDOMINANTLY MONONUCLEAR     NO SQUAMOUS EPITHELIAL CELLS SEEN     RARE GRAM POSITIVE COCCI     IN PAIRS     Performed at [facility]   Culture     Final   Value: NO ANAEROBES ISOLATED; CULTURE IN PROGRESS FOR 5 DAYS     Performed at [facility]   Report Status PENDING   Incomplete  CULTURE, BLOOD (ROUTINE X 2)     Status: None   Collection Time    03/08/14  8:11 AM      Result Value Ref Range Status   Specimen Description BLOOD RIGHT ANTECUBITAL   Final   Special Requests BOTTLES DRAWN AEROBIC AND ANAEROBIC 10CC   Final   Culture  Setup Time     Final   Value: 03/08/2014 15:07     Performed at [facility]   Culture     Final   Value:        BLOOD CULTURE RECEIVED NO GROWTH TO DATE CULTURE WILL BE HELD FOR 5 DAYS BEFORE ISSUING A FINAL NEGATIVE REPORT     Performed at [facility]   Report Status PENDING   Incomplete  CULTURE, BLOOD (ROUTINE X 2)     Status: None   Collection Time    03/08/14  8:18 AM      Result Value Ref Range Status   Specimen Description BLOOD LEFT ANTECUBITAL   Final   Special Requests BOTTLES DRAWN AEROBIC AND ANAEROBIC 10CC   Final   Culture  Setup Time     Final   Value: 03/08/2014 15:07     Performed at [facility]   Culture     Final   Value:        BLOOD CULTURE RECEIVED NO GROWTH TO DATE CULTURE WILL BE HELD FOR 5 DAYS BEFORE ISSUING A FINAL NEGATIVE REPORT     Performed at [facility]   Report Status PENDING   Incomplete  TISSUE CULTURE     Status: None   Collection Time    03/10/14  5:32 PM      Result Value Ref Range Status   Specimen Description TISSUE BONE FINGER LEFT   Final   Special Requests PATIENT ON FOLLOWING VANCOMYCIN LEFT INDEX FINGER   Final   Gram Stain     Final   Value: RARE WBC PRESENT, PREDOMINANTLY MONONUCLEAR     NO SQUAMOUS EPITHELIAL CELLS SEEN     NO ORGANISMS SEEN     Performed at [facility]   Culture PENDING   Incomplete   Report Status PENDING   Incomplete     Labs: Basic Metabolic Panel:  Recent Labs Lab 03/06/14 2312 03/07/14 0637 03/08/14 0525 03/09/14 0546  NA 132* 134* 137 140  K 3.7 4.1 4.2 3.7  CL 97 100 103 105  CO2 22 25 23 23   GLUCOSE 163* 132* 106* 114*  BUN 12 9 7 8   CREATININE 1.11 1.06 0.96 0.83  CALCIUM 8.8 8.3* 8.3* 8.8   Liver Function Tests:  Recent Labs Lab 03/06/14 2312 03/07/14 0637 03/08/14 0525  AST 67* 136* 106*  ALT 60* 100* 113*  ALKPHOS 189* 246* 307*  BILITOT 1.1 1.5* 0.9  PROT 7.8 6.9 6.4  ALBUMIN 3.6 3.3* 2.8*    Recent Labs Lab 03/06/14 2312 03/07/14 0918  LIPASE 649* 279*   No results found for this basename: AMMONIA,  in the last 168 hours CBC:  Recent Labs Lab 03/06/14 2219 03/07/14 0637 03/08/14 0525 03/09/14 0546  WBC 8.8 6.7 6.4 5.7  NEUTROABS 5.9  --   --   --   HGB 13.3 12.6* 11.3* 12.5*  HCT 36.9* 35.7* 33.1* 35.6*  MCV 89.1 90.2 91.2 90.6  PLT 207 176 172 215   Cardiac Enzymes: No results found for this basename: CKTOTAL, CKMB, CKMBINDEX, TROPONINI,  in the last 168 hours BNP: BNP (last 3 results) No results found for this basename: PROBNP,  in the last 8760 hours CBG: No results found for this basename: GLUCAP,  in the last 168 hours     Signed:  Sheila Oats  [facility] 03/11/2014, 1:29 PM

## 2014-03-12 LAB — ANAEROBIC CULTURE: GRAM STAIN: NONE SEEN

## 2014-03-13 LAB — ANAEROBIC CULTURE

## 2014-03-14 LAB — CULTURE, BLOOD (ROUTINE X 2)
CULTURE: NO GROWTH
Culture: NO GROWTH

## 2014-03-14 LAB — TISSUE CULTURE

## 2014-03-26 ENCOUNTER — Encounter (HOSPITAL_COMMUNITY): Payer: Self-pay | Admitting: Orthopedic Surgery

## 2014-03-26 NOTE — Addendum Note (Signed)
Addendum created 03/26/14 6286 by Rica Koyanagi, MD   Modules edited: Anesthesia Events

## 2014-08-05 ENCOUNTER — Encounter (HOSPITAL_COMMUNITY): Payer: Self-pay | Admitting: Cardiovascular Disease

## 2014-09-16 ENCOUNTER — Emergency Department (HOSPITAL_COMMUNITY)
Admission: EM | Admit: 2014-09-16 | Discharge: 2014-09-16 | Disposition: A | Discharge status: Home / Self Care | Payer: Medicare Other | Attending: Emergency Medicine | Admitting: Emergency Medicine

## 2014-09-16 ENCOUNTER — Encounter (HOSPITAL_COMMUNITY): Payer: Self-pay | Admitting: Emergency Medicine

## 2014-09-16 DIAGNOSIS — Z8669 Personal history of other diseases of the nervous system and sense organs: Secondary | ICD-10-CM | POA: Diagnosis not present

## 2014-09-16 DIAGNOSIS — Z79899 Other long term (current) drug therapy: Secondary | ICD-10-CM | POA: Insufficient documentation

## 2014-09-16 DIAGNOSIS — Z9889 Other specified postprocedural states: Secondary | ICD-10-CM | POA: Diagnosis not present

## 2014-09-16 DIAGNOSIS — L089 Local infection of the skin and subcutaneous tissue, unspecified: Secondary | ICD-10-CM | POA: Diagnosis present

## 2014-09-16 DIAGNOSIS — Z720 Tobacco use: Secondary | ICD-10-CM | POA: Diagnosis not present

## 2014-09-16 MED ORDER — DOXYCYCLINE HYCLATE 100 MG PO CAPS: 100.0000 mg | ORAL_CAPSULE | Freq: Two times a day (BID) | ORAL | Status: DC

## 2014-09-16 MED ORDER — HYDROCODONE-ACETAMINOPHEN 7.5-325 MG PO TABS: 1.0000 | ORAL_TABLET | ORAL | Status: DC | PRN

## 2014-09-16 MED ORDER — DOXYCYCLINE HYCLATE 100 MG PO TABS
100.0000 mg | ORAL_TABLET | Freq: Once | ORAL | Status: AC
  Administered 2014-09-16: 100 mg via ORAL
  Filled 2014-09-16: qty 1

## 2014-09-16 MED ORDER — AMOXICILLIN-POT CLAVULANATE 875-125 MG PO TABS
1.0000 | ORAL_TABLET | Freq: Once | ORAL | Status: AC
  Administered 2014-09-16: 1 via ORAL
  Filled 2014-09-16: qty 1

## 2014-09-16 MED ORDER — AMOXICILLIN-POT CLAVULANATE 875-125 MG PO TABS: 1.0000 | ORAL_TABLET | Freq: Two times a day (BID) | ORAL | Status: DC

## 2014-09-16 NOTE — Discharge Instructions (Signed)
Please cleanse your finger wound with soap and water daily. Please apply clean dressing daily. Please elevate your hand above your heart is much as possible. Please use doxycycline and Augmentin 2 times daily with food. Use Tylenol or ibuprofen for mild pain, use Norco for more severe pain. Norco may cause drowsiness, please do not drive, use alcohol, up rate machines, and legal affairs, or participate in activities that require concentration while taking this medication. It is important that you see Dr. Apolonio Schneiders, or your primary physician in 3 days for recheck of your hand. Please return to the emergency department if any emergent changes, problems, or concerns.

## 2014-09-16 NOTE — ED Provider Notes (Signed)
CSN: [id number]     Arrival date & time 09/16/14  1309 History   First MD Initiated Contact with Patient 09/16/14 1523     Chief Complaint  Patient presents with  . Finger Injury     (Consider location/radiation/quality/duration/timing/severity/associated sxs/prior Treatment) HPI Comments: Pt is a 34 year old male with hx of amputation of the left index finger and severe injury to the left middle finger in 2015. He is scheduled to have surgery on the left middle finger on March 7. He has been treated for infection in the past. He has had imaging for bone infection that was negative. Today he noted that the time of the left index finger ruptured and had some mild drainage. This occurred while pt was at work. No hx of fever. No noted red streaking. Pt c/o pain of the left middle finger and the hand. No problem with the left forearm or upper arm.     The history is provided by the patient.    Past Medical History  Diagnosis Date  . Sleep apnea     Stop Bang score of 4   Past Surgical History  Procedure Laterality Date  . Back surgery  x2  . Cholecystectomy N/A 12/07/2013    Procedure: LAPAROSCOPIC CHOLECYSTECTOMY;  Surgeon: Jamesetta So, MD;  Location: [facility];  Service: General;  Laterality: N/A;  . I&d extremity Left 03/07/2014    Procedure: IRRIGATION AND DEBRIDEMENT INDEX FINGER;  Surgeon: Linna Hoff, MD;  Location: [place];  Service: Orthopedics;  Laterality: Left;  . Amputation Left 03/10/2014    Procedure: Irrigation and Debridement and Revision of Amputation Left Index Finger;  Surgeon: Linna Hoff, MD;  Location: [place];  Service: Orthopedics;  Laterality: Left;  Wants to go at 5PM   No family history on file. History  Substance Use Topics  . Smoking status: Current Every Day Smoker -- 1.00 packs/day for 20 years    Types: Cigarettes  . Smokeless tobacco: Not on file  . Alcohol Use: Yes     Comment: social    Review of Systems  Constitutional: Negative for activity  change.       All ROS Neg except as noted in HPI  Eyes: Negative for photophobia and discharge.  Respiratory: Negative for cough, shortness of breath and wheezing.   Cardiovascular: Negative for chest pain and palpitations.  Gastrointestinal: Negative for abdominal pain and blood in stool.  Genitourinary: Negative for dysuria, frequency and hematuria.  Musculoskeletal: Positive for back pain and arthralgias. Negative for neck pain.  Skin: Negative.   Neurological: Negative for dizziness, seizures and speech difficulty.  Psychiatric/Behavioral: Negative for hallucinations and confusion. The patient is nervous/anxious.       Allergies  Bactrim; Sulfites; and Toradol  Home Medications   Prior to Admission medications   Medication Sig Start Date End Date Taking? Authorizing Provider  ALPRAZolam Duanne Moron) 1 MG tablet Take 1 mg by mouth 3 (three) times daily as needed. For anxiety 08/25/14  Yes Historical Provider, MD  VYVANSE 40 MG capsule Take 40 mg by mouth daily.  08/25/14  Yes Historical Provider, MD   BP 120/74 mmHg  Pulse 60  Temp(Src) 98.2 F (36.8 C) (Oral)  Resp 15  Ht 6\' 6"  (1.981 m)  Wt 240 lb (108.863 kg)  BMI 27.74 kg/m2  SpO2 99% Physical Exam  Constitutional: He is oriented to person, place, and time. He appears well-developed and well-nourished.  Non-toxic appearance.  HENT:  Head: Normocephalic.  Right Ear: Tympanic membrane and external ear normal.  Left Ear: Tympanic membrane and external ear normal.  Eyes: EOM and lids are normal. Pupils are equal, round, and reactive to light.  Neck: Normal range of motion. Neck supple. Carotid bruit is not present.  Cardiovascular: Normal rate, regular rhythm, normal heart sounds, intact distal pulses and normal pulses.   Pulmonary/Chest: Breath sounds normal. No respiratory distress.  Abdominal: Soft. Bowel sounds are normal. There is no tenderness. There is no guarding.  Musculoskeletal: Normal range of motion.  There is a  well healed surgical amputation of the distal portion of the left index finger. No signs of infection. There is an open area of the tip of the left middle finger. Some swelling of the  DIP area and surgical stump. Swelling of the remainder of the middle finger is not as intense. The finger is tender to palpation. No red streaks up the hand. No palpable nodes of the bicep/tricep area, or axilla. FROM of the left elbow and shoulder.  Lymphadenopathy:       Head (right side): No submandibular adenopathy present.       Head (left side): No submandibular adenopathy present.    He has no cervical adenopathy.  Neurological: He is alert and oriented to person, place, and time. He has normal strength. No cranial nerve deficit or sensory deficit.  Skin: Skin is warm and dry.  Psychiatric: His speech is normal. His mood appears anxious.  Nursing note and vitals reviewed.   ED Course  Procedures (including critical care time) Labs Review Labs Reviewed - No data to display  Imaging Review No results found.   EKG Interpretation None      MDM  The wound was cleansed and bandage applied. No active drainage. Pt is scheduled for Surgery on March 7. No fever. Pt does NOT want any iv antibiotics. Rx for norco, doxycycline, and augmenting. Pt to keep appointment as scheduled. He will return if any changes or problems.   Final diagnoses:  Finger infection    **I have reviewed nursing notes, vital signs, and all appropriate lab and imaging results for this patient.Lenox Ahr, PA-C 09/18/14 [place], MD 09/22/14 [phone]

## 2014-09-16 NOTE — ED Notes (Signed)
Pt has left middle finger old injury, Schedule surgery March 7th, today at work finger burst open today at work,

## 2014-09-24 ENCOUNTER — Encounter (HOSPITAL_BASED_OUTPATIENT_CLINIC_OR_DEPARTMENT_OTHER): Payer: Self-pay | Admitting: *Deleted

## 2014-09-24 NOTE — Progress Notes (Signed)
NPO AFTER MN. ARRIVE AT 1000. NEEDS HG. MAY TAKE OXYCODONE IF NEEDED W/ SIPS OF WATER DOS.

## 2014-09-27 ENCOUNTER — Ambulatory Visit (HOSPITAL_BASED_OUTPATIENT_CLINIC_OR_DEPARTMENT_OTHER)
Admission: RE | Admit: 2014-09-27 | Discharge: 2014-09-27 | Disposition: A | Discharge status: Home / Self Care | Payer: Medicare Other | Source: Ambulatory Visit | Attending: Orthopedic Surgery | Admitting: Orthopedic Surgery

## 2014-09-27 ENCOUNTER — Encounter (HOSPITAL_BASED_OUTPATIENT_CLINIC_OR_DEPARTMENT_OTHER)
Admission: RE | Disposition: A | Discharge status: Home / Self Care | Payer: Self-pay | Source: Ambulatory Visit | Attending: Orthopedic Surgery

## 2014-09-27 ENCOUNTER — Encounter (HOSPITAL_BASED_OUTPATIENT_CLINIC_OR_DEPARTMENT_OTHER): Payer: Self-pay | Admitting: *Deleted

## 2014-09-27 ENCOUNTER — Ambulatory Visit (HOSPITAL_BASED_OUTPATIENT_CLINIC_OR_DEPARTMENT_OTHER): Payer: Medicare Other | Admitting: Anesthesiology

## 2014-09-27 DIAGNOSIS — Z5329 Procedure and treatment not carried out because of patient's decision for other reasons: Secondary | ICD-10-CM | POA: Diagnosis not present

## 2014-09-27 DIAGNOSIS — M868X4 Other osteomyelitis, hand: Secondary | ICD-10-CM | POA: Diagnosis not present

## 2014-09-27 DIAGNOSIS — F419 Anxiety disorder, unspecified: Secondary | ICD-10-CM | POA: Insufficient documentation

## 2014-09-27 DIAGNOSIS — F17210 Nicotine dependence, cigarettes, uncomplicated: Secondary | ICD-10-CM | POA: Diagnosis not present

## 2014-09-27 HISTORY — DX: Presence of spectacles and contact lenses: Z97.3

## 2014-09-27 LAB — POCT HEMOGLOBIN-HEMACUE: Hemoglobin: 10.4 g/dL — ABNORMAL LOW (ref 13.0–17.0)

## 2014-09-27 SURGERY — AMPUTATION DIGIT
Anesthesia: General | Site: Finger | Laterality: Left

## 2014-09-27 MED ORDER — SODIUM CHLORIDE 0.9 % IV SOLN
1500.0000 mg | INTRAVENOUS | Status: AC
  Filled 2014-09-27: qty 1500

## 2014-09-27 MED ORDER — CHLORHEXIDINE GLUCONATE 4 % EX LIQD
60.0000 mL | Freq: Once | CUTANEOUS | Status: DC
  Filled 2014-09-27: qty 60

## 2014-09-27 MED ORDER — LACTATED RINGERS IV SOLN
INTRAVENOUS | Status: DC
  Administered 2014-09-27: 11:00:00 via INTRAVENOUS
  Filled 2014-09-27: qty 1000

## 2014-09-27 MED ORDER — VANCOMYCIN HCL 500 MG IV SOLR
INTRAVENOUS | Status: AC
  Filled 2014-09-27: qty 1000

## 2014-09-27 MED ORDER — MIDAZOLAM HCL 2 MG/2ML IJ SOLN
INTRAMUSCULAR | Status: AC
  Filled 2014-09-27: qty 2

## 2014-09-27 MED ORDER — VANCOMYCIN HCL 500 MG IV SOLR
INTRAVENOUS | Status: AC
  Filled 2014-09-27: qty 500

## 2014-09-27 MED ORDER — BUPIVACAINE HCL (PF) 0.25 % IJ SOLN: INTRAMUSCULAR | Status: DC | PRN

## 2014-09-27 MED ORDER — FENTANYL CITRATE 0.05 MG/ML IJ SOLN
INTRAMUSCULAR | Status: AC
  Filled 2014-09-27: qty 2

## 2014-09-27 SURGICAL SUPPLY — 37 items
BANDAGE ELASTIC 3 VELCRO ST LF (GAUZE/BANDAGES/DRESSINGS) IMPLANT
BLADE SURG 15 STRL LF DISP TIS (BLADE) IMPLANT
BLADE SURG 15 STRL SS (BLADE)
BNDG CONFORM 3 STRL LF (GAUZE/BANDAGES/DRESSINGS) IMPLANT
BNDG ESMARK 4X9 LF (GAUZE/BANDAGES/DRESSINGS) IMPLANT
CORDS BIPOLAR (ELECTRODE) IMPLANT
COVER TABLE BACK 60X90 (DRAPES) IMPLANT
CUFF TOURNIQUET SINGLE 18IN (TOURNIQUET CUFF) IMPLANT
DRAPE EXTREMITY T 121X128X90 (DRAPE) IMPLANT
DRAPE LG THREE QUARTER DISP (DRAPES) IMPLANT
DRAPE SURG 17X23 STRL (DRAPES) IMPLANT
DRSG EMULSION OIL 3X3 NADH (GAUZE/BANDAGES/DRESSINGS) IMPLANT
GAUZE XEROFORM 1X8 LF (GAUZE/BANDAGES/DRESSINGS) IMPLANT
GLOVE BIO SURGEON STRL SZ8 (GLOVE) IMPLANT
GLOVE BIOGEL PI IND STRL 8.5 (GLOVE) IMPLANT
GLOVE BIOGEL PI INDICATOR 8.5 (GLOVE)
GOWN STRL REUS W/ TWL LRG LVL3 (GOWN DISPOSABLE) IMPLANT
GOWN STRL REUS W/ TWL XL LVL3 (GOWN DISPOSABLE) IMPLANT
GOWN STRL REUS W/TWL LRG LVL3 (GOWN DISPOSABLE)
GOWN STRL REUS W/TWL XL LVL3 (GOWN DISPOSABLE)
KNIFE CARPAL TUNNEL (BLADE) IMPLANT
NDL SAFETY ECLIPSE 18X1.5 (NEEDLE) IMPLANT
NEEDLE HYPO 18GX1.5 SHARP (NEEDLE)
NEEDLE HYPO 25X1 1.5 SAFETY (NEEDLE) IMPLANT
NS IRRIG 500ML POUR BTL (IV SOLUTION) IMPLANT
PACK BASIN DAY SURGERY FS (CUSTOM PROCEDURE TRAY) IMPLANT
PAD ALCOHOL SWAB (MISCELLANEOUS) IMPLANT
PAD CAST 3X4 CTTN HI CHSV (CAST SUPPLIES) IMPLANT
PADDING CAST COTTON 3X4 STRL (CAST SUPPLIES)
SPONGE GAUZE 4X4 12PLY STER LF (GAUZE/BANDAGES/DRESSINGS) IMPLANT
STOCKINETTE 4X48 STRL (DRAPES) IMPLANT
SUT PROLENE 4 0 PS 2 18 (SUTURE) IMPLANT
SYR BULB 3OZ (MISCELLANEOUS) IMPLANT
SYR CONTROL 10ML LL (SYRINGE) IMPLANT
TOWEL OR 17X24 6PK STRL BLUE (TOWEL DISPOSABLE) IMPLANT
TRAY DSU PREP LF (CUSTOM PROCEDURE TRAY) IMPLANT
UNDERPAD 30X30 INCONTINENT (UNDERPADS AND DIAPERS) IMPLANT

## 2014-09-27 NOTE — Anesthesia Preprocedure Evaluation (Addendum)
Anesthesia Evaluation  Patient identified by MRN, date of birth, ID band Patient awake    Reviewed: Allergy & Precautions, NPO status , Patient's Chart, lab work & pertinent test results  Airway Mallampati: II  TM Distance: >3 FB Neck ROM: Full    Dental  (+) Missing, Dental Advisory Given, Caps, Poor Dentition,    Pulmonary Current Smoker,  breath sounds clear to auscultation  Pulmonary exam normal       Cardiovascular Exercise Tolerance: Good negative cardio ROS  Rhythm:Regular Rate:Normal     Neuro/Psych Anxiety negative neurological ROS     GI/Hepatic negative GI ROS, Neg liver ROS,   Endo/Other  negative endocrine ROS  Renal/GU negative Renal ROS  negative genitourinary   Musculoskeletal negative musculoskeletal ROS (+)   Abdominal   Peds negative pediatric ROS (+)  Hematology negative hematology ROS (+)   Anesthesia Other Findings Multiple teeth missing  Reproductive/Obstetrics negative OB ROS                         Anesthesia Physical Anesthesia Plan  ASA: II  Anesthesia Plan: General   Post-op Pain Management:    Induction: Intravenous  Airway Management Planned: LMA  Additional Equipment:   Intra-op Plan:   Post-operative Plan: Extubation in OR  Informed Consent: I have reviewed the patients History and Physical, chart, labs and discussed the procedure including the risks, benefits and alternatives for the proposed anesthesia with the patient or authorized representative who has indicated his/her understanding and acceptance.   Dental advisory given  Plan Discussed with: CRNA  Anesthesia Plan Comments: (LMA #5 on 03-10-14  1145: Dr. Apolonio Schneiders called and has to cancel procedure for today.)       Anesthesia Quick Evaluation

## 2014-09-27 NOTE — Progress Notes (Signed)
Physician delayed till late this afternoon.Pt elected to reschedule.Office will be in contact.

## 2014-10-07 ENCOUNTER — Encounter (HOSPITAL_BASED_OUTPATIENT_CLINIC_OR_DEPARTMENT_OTHER): Admission: RE | Payer: Self-pay | Source: Ambulatory Visit

## 2014-10-07 ENCOUNTER — Ambulatory Visit (HOSPITAL_BASED_OUTPATIENT_CLINIC_OR_DEPARTMENT_OTHER): Admission: RE | Admit: 2014-10-07 | Payer: Medicare Other | Source: Ambulatory Visit | Admitting: Orthopedic Surgery

## 2014-10-07 SURGERY — AMPUTATION, HAND
Anesthesia: General | Laterality: Left

## 2014-10-18 ENCOUNTER — Encounter (HOSPITAL_BASED_OUTPATIENT_CLINIC_OR_DEPARTMENT_OTHER): Payer: Self-pay | Admitting: *Deleted

## 2014-10-18 NOTE — Progress Notes (Signed)
NPO AFTER MN WITH EXCEPTION CLEAR LIQUIDS UNTIL 0800 (NO CREAM/ MILK PRODUCTS)..  ARRIVE AT 1230. CURRENT HG IN EPIC AND CHART. MAY TAKE OXYCODONE IF NEEDED AM DOS W/ SIPS  OF WATER.

## 2014-10-20 ENCOUNTER — Encounter (HOSPITAL_COMMUNITY): Payer: Self-pay | Admitting: *Deleted

## 2014-10-20 NOTE — Anesthesia Preprocedure Evaluation (Signed)
Anesthesia Evaluation  Patient identified by MRN, date of birth, ID band Patient awake    Reviewed: Allergy & Precautions, NPO status , Patient's Chart, lab work & pertinent test results  History of Anesthesia Complications Negative for: history of anesthetic complications  Airway Mallampati: II  TM Distance: >3 FB Neck ROM: Full    Dental no notable dental hx. (+) Dental Advisory Given   Pulmonary sleep apnea , Current Smoker,  breath sounds clear to auscultation  Pulmonary exam normal       Cardiovascular negative cardio ROS  Rhythm:Regular Rate:Normal     Neuro/Psych PSYCHIATRIC DISORDERS Anxiety negative neurological ROS     GI/Hepatic negative GI ROS, Neg liver ROS,   Endo/Other  obesity  Renal/GU negative Renal ROS  negative genitourinary   Musculoskeletal negative musculoskeletal ROS (+)   Abdominal   Peds negative pediatric ROS (+)  Hematology negative hematology ROS (+)   Anesthesia Other Findings   Reproductive/Obstetrics negative OB ROS                             Anesthesia Physical Anesthesia Plan  ASA: II  Anesthesia Plan: General   Post-op Pain Management:    Induction: Intravenous  Airway Management Planned: LMA  Additional Equipment:   Intra-op Plan:   Post-operative Plan: Extubation in OR  Informed Consent: I have reviewed the patients History and Physical, chart, labs and discussed the procedure including the risks, benefits and alternatives for the proposed anesthesia with the patient or authorized representative who has indicated his/her understanding and acceptance.   Dental advisory given  Plan Discussed with: CRNA  Anesthesia Plan Comments:         Anesthesia Quick Evaluation

## 2014-10-20 NOTE — Progress Notes (Signed)
Please put orders in Epic for Same day surgery 10-21-14 Thanks

## 2014-10-21 ENCOUNTER — Encounter (HOSPITAL_COMMUNITY)
Admission: RE | Disposition: A | Discharge status: Home / Self Care | Payer: Self-pay | Source: Ambulatory Visit | Attending: Orthopedic Surgery

## 2014-10-21 ENCOUNTER — Ambulatory Visit (HOSPITAL_COMMUNITY): Payer: Medicare Other | Admitting: Anesthesiology

## 2014-10-21 ENCOUNTER — Encounter (HOSPITAL_COMMUNITY): Payer: Self-pay | Admitting: *Deleted

## 2014-10-21 ENCOUNTER — Ambulatory Visit (HOSPITAL_COMMUNITY)
Admission: RE | Admit: 2014-10-21 | Discharge: 2014-10-21 | Disposition: A | Discharge status: Home / Self Care | Payer: Medicare Other | Source: Ambulatory Visit | Attending: Orthopedic Surgery | Admitting: Orthopedic Surgery

## 2014-10-21 DIAGNOSIS — Z881 Allergy status to other antibiotic agents status: Secondary | ICD-10-CM | POA: Diagnosis not present

## 2014-10-21 DIAGNOSIS — Z888 Allergy status to other drugs, medicaments and biological substances status: Secondary | ICD-10-CM | POA: Insufficient documentation

## 2014-10-21 DIAGNOSIS — Z89022 Acquired absence of left finger(s): Secondary | ICD-10-CM | POA: Insufficient documentation

## 2014-10-21 DIAGNOSIS — Z9049 Acquired absence of other specified parts of digestive tract: Secondary | ICD-10-CM | POA: Insufficient documentation

## 2014-10-21 DIAGNOSIS — M868X4 Other osteomyelitis, hand: Secondary | ICD-10-CM | POA: Insufficient documentation

## 2014-10-21 DIAGNOSIS — L089 Local infection of the skin and subcutaneous tissue, unspecified: Secondary | ICD-10-CM

## 2014-10-21 DIAGNOSIS — Z882 Allergy status to sulfonamides status: Secondary | ICD-10-CM | POA: Insufficient documentation

## 2014-10-21 HISTORY — DX: Pain in left hand: M79.642

## 2014-10-21 HISTORY — DX: Personal history of Methicillin resistant Staphylococcus aureus infection: Z86.14

## 2014-10-21 HISTORY — DX: Osteomyelitis, unspecified: M86.9

## 2014-10-21 HISTORY — PX: AMPUTATION: SHX166

## 2014-10-21 LAB — COMPREHENSIVE METABOLIC PANEL
ALK PHOS: 65 U/L (ref 39–117)
ALT: 35 U/L (ref 0–53)
ANION GAP: 7 (ref 5–15)
AST: 32 U/L (ref 0–37)
Albumin: 4.4 g/dL (ref 3.5–5.2)
BILIRUBIN TOTAL: 1.3 mg/dL — AB (ref 0.3–1.2)
BUN: 10 mg/dL (ref 6–23)
CHLORIDE: 104 mmol/L (ref 96–112)
CO2: 26 mmol/L (ref 19–32)
Calcium: 9 mg/dL (ref 8.4–10.5)
Creatinine, Ser: 0.94 mg/dL (ref 0.50–1.35)
GLUCOSE: 102 mg/dL — AB (ref 70–99)
POTASSIUM: 3.8 mmol/L (ref 3.5–5.1)
SODIUM: 137 mmol/L (ref 135–145)
Total Protein: 7.6 g/dL (ref 6.0–8.3)

## 2014-10-21 LAB — SURGICAL PCR SCREEN
MRSA, PCR: NEGATIVE
STAPHYLOCOCCUS AUREUS: NEGATIVE

## 2014-10-21 LAB — CBC
HEMATOCRIT: 43.7 % (ref 39.0–52.0)
Hemoglobin: 14.9 g/dL (ref 13.0–17.0)
MCH: 32.3 pg (ref 26.0–34.0)
MCHC: 34.1 g/dL (ref 30.0–36.0)
MCV: 94.6 fL (ref 78.0–100.0)
Platelets: 220 10*3/uL (ref 150–400)
RBC: 4.62 MIL/uL (ref 4.22–5.81)
RDW: 12.8 % (ref 11.5–15.5)
WBC: 9.9 10*3/uL (ref 4.0–10.5)

## 2014-10-21 SURGERY — AMPUTATION DIGIT
Anesthesia: General | Site: Finger | Laterality: Left

## 2014-10-21 MED ORDER — FENTANYL CITRATE 0.05 MG/ML IJ SOLN
INTRAMUSCULAR | Status: AC
  Filled 2014-10-21: qty 2

## 2014-10-21 MED ORDER — LIDOCAINE HCL (CARDIAC) 20 MG/ML IV SOLN
INTRAVENOUS | Status: DC | PRN
  Administered 2014-10-21: 50 mg via INTRAVENOUS

## 2014-10-21 MED ORDER — VANCOMYCIN HCL 10 G IV SOLR
1500.0000 mg | INTRAVENOUS | Status: AC
  Administered 2014-10-21: 1500 mg via INTRAVENOUS
  Filled 2014-10-21: qty 1500

## 2014-10-21 MED ORDER — CLINDAMYCIN HCL 300 MG PO CAPS: 300.0000 mg | ORAL_CAPSULE | Freq: Three times a day (TID) | ORAL | Status: DC

## 2014-10-21 MED ORDER — MIDAZOLAM HCL 2 MG/2ML IJ SOLN
INTRAMUSCULAR | Status: AC
Start: 2014-10-21 — End: 2014-10-21
  Filled 2014-10-21: qty 2

## 2014-10-21 MED ORDER — CHLORHEXIDINE GLUCONATE 4 % EX LIQD: 60.0000 mL | Freq: Once | CUTANEOUS | Status: DC

## 2014-10-21 MED ORDER — ONDANSETRON HCL 4 MG/2ML IJ SOLN
INTRAMUSCULAR | Status: DC | PRN
  Administered 2014-10-21: 4 mg via INTRAVENOUS

## 2014-10-21 MED ORDER — FENTANYL CITRATE 0.05 MG/ML IJ SOLN
50.0000 ug | INTRAMUSCULAR | Status: DC | PRN
  Administered 2014-10-21: 100 ug via INTRAVENOUS

## 2014-10-21 MED ORDER — BUPIVACAINE HCL (PF) 0.25 % IJ SOLN
INTRAMUSCULAR | Status: AC
  Filled 2014-10-21: qty 30

## 2014-10-21 MED ORDER — LIDOCAINE HCL (CARDIAC) 20 MG/ML IV SOLN
INTRAVENOUS | Status: AC
  Filled 2014-10-21: qty 5

## 2014-10-21 MED ORDER — PROPOFOL 10 MG/ML IV BOLUS
INTRAVENOUS | Status: AC
  Filled 2014-10-21: qty 20

## 2014-10-21 MED ORDER — OXYCODONE-ACETAMINOPHEN 10-325 MG PO TABS: 1.0000 | ORAL_TABLET | ORAL | Status: DC | PRN

## 2014-10-21 MED ORDER — PROPOFOL 10 MG/ML IV BOLUS
INTRAVENOUS | Status: DC | PRN
  Administered 2014-10-21: 200 mg via INTRAVENOUS

## 2014-10-21 MED ORDER — BUPIVACAINE HCL 0.25 % IJ SOLN
INTRAMUSCULAR | Status: DC | PRN
  Administered 2014-10-21: 9 mL

## 2014-10-21 MED ORDER — MIDAZOLAM HCL 5 MG/5ML IJ SOLN
INTRAMUSCULAR | Status: DC | PRN
  Administered 2014-10-21: 2 mg via INTRAVENOUS

## 2014-10-21 MED ORDER — FENTANYL CITRATE 0.05 MG/ML IJ SOLN
INTRAMUSCULAR | Status: DC | PRN
  Administered 2014-10-21 (×2): 50 ug via INTRAVENOUS

## 2014-10-21 MED ORDER — MUPIROCIN 2 % EX OINT
TOPICAL_OINTMENT | Freq: Two times a day (BID) | CUTANEOUS | Status: DC
  Administered 2014-10-21: 12:00:00 via NASAL
  Filled 2014-10-21: qty 22

## 2014-10-21 MED ORDER — FENTANYL CITRATE 0.05 MG/ML IJ SOLN
25.0000 ug | INTRAMUSCULAR | Status: DC | PRN
  Administered 2014-10-21: 50 ug via INTRAVENOUS

## 2014-10-21 MED ORDER — 0.9 % SODIUM CHLORIDE (POUR BTL) OPTIME
TOPICAL | Status: DC | PRN
  Administered 2014-10-21: 1000 mL

## 2014-10-21 MED ORDER — LACTATED RINGERS IV SOLN
INTRAVENOUS | Status: DC
  Administered 2014-10-21 (×2): 1000 mL via INTRAVENOUS

## 2014-10-21 MED ORDER — ACETAMINOPHEN 10 MG/ML IV SOLN
1000.0000 mg | Freq: Once | INTRAVENOUS | Status: AC
  Administered 2014-10-21: 1000 mg via INTRAVENOUS
  Filled 2014-10-21: qty 100

## 2014-10-21 MED ORDER — ONDANSETRON HCL 4 MG/2ML IJ SOLN: 4.0000 mg | Freq: Once | INTRAMUSCULAR | Status: DC | PRN

## 2014-10-21 SURGICAL SUPPLY — 26 items
BAG ZIPLOCK 12X15 (MISCELLANEOUS) ×2 IMPLANT
BANDAGE COBAN STERILE 2 (GAUZE/BANDAGES/DRESSINGS) ×2 IMPLANT
BLADE SURG SZ10 CARB STEEL (BLADE) ×4 IMPLANT
BNDG CONFORM 2 STRL LF (GAUZE/BANDAGES/DRESSINGS) ×2 IMPLANT
CUFF TOURN SGL QUICK 18 (TOURNIQUET CUFF) ×2 IMPLANT
DRAPE SURG 17X11 SM STRL (DRAPES) ×2 IMPLANT
DRSG ADAPTIC 3X8 NADH LF (GAUZE/BANDAGES/DRESSINGS) ×2 IMPLANT
ELECT REM PT RETURN 9FT ADLT (ELECTROSURGICAL) ×2
ELECTRODE REM PT RTRN 9FT ADLT (ELECTROSURGICAL) ×1 IMPLANT
GAUZE SPONGE 2X2 8PLY STRL LF (GAUZE/BANDAGES/DRESSINGS) ×1 IMPLANT
GAUZE SPONGE 4X4 12PLY STRL (GAUZE/BANDAGES/DRESSINGS) ×4 IMPLANT
GLOVE SURG ORTHO 8.0 STRL STRW (GLOVE) ×2 IMPLANT
GOWN STRL REUS W/TWL XL LVL3 (GOWN DISPOSABLE) ×2 IMPLANT
KIT BASIN OR (CUSTOM PROCEDURE TRAY) ×2 IMPLANT
MANIFOLD NEPTUNE II (INSTRUMENTS) ×2 IMPLANT
NS IRRIG 1000ML POUR BTL (IV SOLUTION) ×2 IMPLANT
PACK ORTHO EXTREMITY (CUSTOM PROCEDURE TRAY) ×2 IMPLANT
PAD CAST 4YDX4 CTTN HI CHSV (CAST SUPPLIES) ×1 IMPLANT
PADDING CAST COTTON 4X4 STRL (CAST SUPPLIES) ×1
SOL PREP POV-IOD 4OZ 10% (MISCELLANEOUS) ×2 IMPLANT
SOL PREP PROV IODINE SCRUB 4OZ (MISCELLANEOUS) ×2 IMPLANT
SPONGE GAUZE 2X2 STER 10/PKG (GAUZE/BANDAGES/DRESSINGS) ×1
SUT PROLENE 3 0 PS 2 (SUTURE) ×2 IMPLANT
SUT PROLENE 4 0 PS 2 18 (SUTURE) ×2 IMPLANT
TOWEL OR 17X26 10 PK STRL BLUE (TOWEL DISPOSABLE) ×2 IMPLANT
WATER STERILE IRR 1500ML POUR (IV SOLUTION) ×2 IMPLANT

## 2014-10-21 NOTE — Anesthesia Postprocedure Evaluation (Signed)
  Anesthesia Post-op Note  Patient: Benjamin Carter  Procedure(s) Performed: Procedure(s) (LRB): LEFT LONG FINGER AMPUTATION REVISION (Left)  Patient Location: PACU  Anesthesia Type: General  Level of Consciousness: awake and alert   Airway and Oxygen Therapy: Patient Spontanous Breathing  Post-op Pain: mild  Post-op Assessment: Post-op Vital signs reviewed, Patient's Cardiovascular Status Stable, Respiratory Function Stable, Patent Airway and No signs of Nausea or vomiting  Last Vitals:  Filed Vitals:   10/21/14 1600  BP: 127/80  Pulse: 68  Temp:   Resp: 16    Post-op Vital Signs: stable   Complications: No apparent anesthesia complications

## 2014-10-21 NOTE — Progress Notes (Signed)
Patient reports pain of left middle finger a 10. He is very animated and laughing. Very engaged with RN and wife.Using left hand. 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

## 2014-10-21 NOTE — Brief Op Note (Signed)
10/21/2014  1:35 PM  PATIENT:  Modena Jansky Danziger  34 y.o. male  PRE-OPERATIVE DIAGNOSIS:  LEFT LONG FINGER OSTEOMYLITIS  POST-OPERATIVE DIAGNOSIS:  LEFT LONG FINGER OSTEOMYLITIS  PROCEDURE:  Procedure(s): LEFT LONG FINGER AMPUTATION REVISION (Left)  SURGEON:  Surgeon(s) and Role:    * Iran Planas, MD - Primary  PHYSICIAN ASSISTANT:   ASSISTANTS: none   ANESTHESIA:   general  EBL:     BLOOD ADMINISTERED:none  DRAINS: none   LOCAL MEDICATIONS USED:  MARCAINE     SPECIMEN:  No Specimen  DISPOSITION OF SPECIMEN:  N/A  COUNTS:  YES  TOURNIQUET:    DICTATION: .Other Dictation: Dictation Number [id number]  PLAN OF CARE: Discharge to home after PACU  PATIENT DISPOSITION:  PACU - hemodynamically stable.   Delay start of Pharmacological VTE agent (>24hrs) due to surgical blood loss or risk of bleeding: not applicable

## 2014-10-21 NOTE — Anesthesia Procedure Notes (Signed)
Procedure Name: LMA Insertion Performed by: Noralyn Pick D Pre-anesthesia Checklist: Patient identified, Emergency Drugs available, Suction available, Patient being monitored and Timeout performed Patient Re-evaluated:Patient Re-evaluated prior to inductionOxygen Delivery Method: Circle system utilized Preoxygenation: Pre-oxygenation with 100% oxygen Intubation Type: IV induction Ventilation: Mask ventilation without difficulty LMA: LMA inserted LMA Size: 5.0 Tube type: Oral Number of attempts: 1 Tube secured with: Tape Dental Injury: Teeth and Oropharynx as per pre-operative assessment

## 2014-10-21 NOTE — Transfer of Care (Signed)
Immediate Anesthesia Transfer of Care Note  Patient: Benjamin Carter  Procedure(s) Performed: Procedure(s) (LRB): LEFT LONG FINGER AMPUTATION REVISION (Left)  Patient Location: PACU  Anesthesia Type: General  Level of Consciousness: sedated, patient cooperative and responds to stimulation  Airway & Oxygen Therapy: Patient Spontanous Breathing and Patient connected to face mask oxgen  Post-op Assessment: Report given to PACU RN and Post -op Vital signs reviewed and stable  Post vital signs: Reviewed and stable  Complications: No apparent anesthesia complications

## 2014-10-21 NOTE — Discharge Instructions (Signed)
KEEP BANDAGE CLEAN AND DRY °CALL OFFICE FOR F/U APPT [phone] IN 8 DAYS °KEEP HAND ELEVATED ABOVE HEART °OK TO APPLY ICE TO OPERATIVE AREA °CONTACT OFFICE IF ANY WORSENING PAIN OR CONCERNS. °

## 2014-10-21 NOTE — H&P (Signed)
Benjamin Carter is an 34 y.o. male.   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 Wears glasses   . Left hand pain   . Osteomyelitis of finger of left hand   . History of MRSA infection     left index finger 03-07-2015 w/ positive blood cultures    Past Surgical History  Procedure Laterality Date  . Cholecystectomy N/A 12/07/2013    Procedure: LAPAROSCOPIC CHOLECYSTECTOMY;  Surgeon: Jamesetta So, MD;  Location: [facility];  Service: General;  Laterality: N/A;  . I&d extremity Left 03/07/2014    Procedure: IRRIGATION AND DEBRIDEMENT INDEX FINGER;  Surgeon: Linna Hoff, MD;  Location: [place];  Service: Orthopedics;  Laterality: Left;  . Amputation Left 03/10/2014    Procedure: Irrigation and Debridement and Revision of Amputation Left Index Finger;  Surgeon: Linna Hoff, MD;  Location: [place];  Service: Orthopedics;  Laterality: Left;  Wants to go at 5PM  . Lumbar disc surgery  2004    History reviewed. No pertinent family history. Social History:  reports that he has been smoking Cigarettes.  He has a 20 pack-year smoking history. He has never used smokeless tobacco. He reports that he drinks alcohol. He reports that he does not use illicit drugs.  Allergies:  Allergies  Allergen Reactions  . Bactrim [Sulfamethoxazole-Trimethoprim]     Caused pancreatitis  . Doxycycline   . Sulfites     Caused elevated liver enzymes  . Toradol [Ketorolac Tromethamine] Rash    Medications Prior to Admission  Medication Sig Dispense Refill  . ALPRAZolam (XANAX) 1 MG tablet Take 1 mg by mouth 3 (three) times daily as needed. For anxiety  2  . clindamycin (CLEOCIN) 300 MG capsule Take 300 mg by mouth 3 (three) times daily.    . Multiple Vitamin (MULTIVITAMIN WITH MINERALS) TABS tablet Take 1 tablet by mouth daily.    Marland Kitchen  oxyCODONE-acetaminophen (PERCOCET) 10-325 MG per tablet Take 1 tablet by mouth every 4 (four) hours as needed for pain.      Results for orders placed or performed during the hospital encounter of 10/21/14 (from the past 48 hour(s))  CBC     Status: None   Collection Time: 10/21/14 11:35 AM  Result Value Ref Range   WBC 9.9 4.0 - 10.5 K/uL   RBC 4.62 4.22 - 5.81 MIL/uL   Hemoglobin 14.9 13.0 - 17.0 g/dL   HCT 43.7 39.0 - 52.0 %   MCV 94.6 78.0 - 100.0 fL   MCH 32.3 26.0 - 34.0 pg   MCHC 34.1 30.0 - 36.0 g/dL   RDW 12.8 11.5 - 15.5 %   Platelets 220 150 - 400 K/uL  Comprehensive metabolic panel     Status: Abnormal   Collection Time: 10/21/14 11:35 AM  Result Value Ref Range   Sodium 137 135 - 145 mmol/L   Potassium 3.8 3.5 - 5.1 mmol/L   Chloride 104 96 - 112 mmol/L   CO2 26 19 - 32 mmol/L   Glucose, Bld 102 (H) 70 - 99 mg/dL   BUN 10 6 - 23 mg/dL   Creatinine, Ser 0.94 0.50 - 1.35 mg/dL   Calcium 9.0 8.4 - 10.5 mg/dL   Total Protein 7.6 6.0 - 8.3 g/dL   Albumin 4.4 3.5 - 5.2 g/dL   AST  32 0 - 37 U/L   ALT 35 0 - 53 U/L   Alkaline Phosphatase 65 39 - 117 U/L   Total Bilirubin 1.3 (H) 0.3 - 1.2 mg/dL   GFR calc non Af Amer >90 >90 mL/min   GFR calc Af Amer >90 >90 mL/min    Comment: (NOTE) The eGFR has been calculated using the CKD EPI equation. This calculation has not been validated in all clinical situations. eGFR's persistently <90 mL/min signify possible Chronic Kidney Disease.    Anion gap 7 5 - 15   No results found.  ROSNO RECENT ILLNESSES OR HOSPITALIZATIONS  Blood pressure 129/76, pulse 76, temperature 97.9 F (36.6 C), temperature source Oral, resp. rate 18, height 6' 6" (1.981 m), weight 129.275 kg (285 lb), SpO2 100 %. Physical Exam  General Appearance:  Alert, cooperative, no distress, appears stated age  Head:  Normocephalic, without obvious abnormality, atraumatic  Eyes:  Pupils equal, conjunctiva/corneas clear,         Throat: Lips, mucosa, and  tongue normal; teeth and gums normal  Neck: No visible masses     Lungs:   respirations unlabored  Chest Wall:  No tenderness or deformity  Heart:  Regular rate and rhythm,  Abdomen:   Soft, non-tender,         Extremities: LEFT LONG FINGER: SWOLLEN DIP JOINT DISTALLY MARKED NAIL BED IRREGULARITY GOOD MOBILITY TO INDEX/LONG/RING AND SMALL FINGERS  Pulses: 2+ and symmetric  Skin: Skin color, texture, turgor normal, no rashes or lesions     Neurologic: Normal   Assessment/Plan LEFT LONG FINGER OSTEOMYELITIS  LEFT LONG FINGER DEBRIDEMENT AND AMPUTATION  R/B/A DISCUSSED WITH PT IN OFFICE.  PT VOICED UNDERSTANDING OF PLAN CONSENT SIGNED DAY OF SURGERY PT SEEN AND EXAMINED PRIOR TO OPERATIVE PROCEDURE/DAY OF SURGERY SITE MARKED. QUESTIONS ANSWERED WILL GO HOME FOLLOWING SURGERY  WE ARE PLANNING SURGERY FOR YOUR UPPER EXTREMITY. THE RISKS AND BENEFITS OF SURGERY INCLUDE BUT NOT LIMITED TO BLEEDING INFECTION, DAMAGE TO NEARBY NERVES ARTERIES TENDONS, FAILURE OF SURGERY TO ACCOMPLISH ITS INTENDED GOALS, PERSISTENT SYMPTOMS AND NEED FOR FURTHER SURGICAL INTERVENTION. WITH THIS IN MIND WE WILL PROCEED. I HAVE DISCUSSED WITH THE PATIENT THE PRE AND POSTOPERATIVE REGIMEN AND THE DOS AND DON'TS. PT VOICED UNDERSTANDING AND INFORMED CONSENT SIGNED.  Linna Hoff 10/21/2014, 1:33 PM

## 2014-10-22 ENCOUNTER — Encounter (HOSPITAL_COMMUNITY): Payer: Self-pay | Admitting: Orthopedic Surgery

## 2014-10-22 NOTE — Op Note (Signed)
NAMECADE, DASHNER NO.:  [id number]  MEDICAL RECORD NO.:  [id number]  LOCATION:  [facility]                         FACILITY:  [facility]  PHYSICIAN:  Melrose Nakayama, MD  DATE OF BIRTH:  [birth year]  DATE OF PROCEDURE:  10/21/2014 DATE OF DISCHARGE:  10/21/2014                              OPERATIVE REPORT   PREOPERATIVE DIAGNOSIS:  Left long finger osteomyelitis.  POSTOPERATIVE DIAGNOSIS:  Left long finger osteomyelitis.  ATTENDING PHYSICIAN:  Melrose Nakayama, MD, who was scrubbed and present for the entire procedure.  ASSISTANT SURGEON:  None.  ANESTHESIA:  General via LMA, 5 mL of 0.25% Marcaine at the end of procedure.  PROCEDURE: 1. Left long finger amputation through the level of the middle phalanx     with local neurectomies and advancement flap closure. 2. Left long finger flexor digitorum profundus tenolysis.  SURGICAL INDICATIONS:  Mr. 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  Patient failed nonsurgical treatment and elected to undergo the above procedure.  Risks, benefits, and alternatives were discussed in detail with the patient.  Signed informed consent was obtained.  Risks include, but not limited to bleeding; infection; damage to nearby nerves, arteries, or tendons; loss motion of wrist and digits; incomplete relief of symptoms; and need for further surgical intervention.  DESCRIPTION OF PROCEDURE:  The patient was properly identified in the preop holding area and marked with a permanent marker made on the left long finger to indicate correct operative site.  The patient was then brought back to the operating room, placed supine on anesthesia room table.  General anesthesia was administered.  The patient tolerated this well.  A well-padded tourniquet was placed on left brachium and sealed with 1000 drape.  Left upper extremity was then prepped and draped  in normal sterile fashion.  Time-out was called.  Correct site was identified, and procedure then begun.  Attention was turned to the left long finger.  A fishmouth incision was then made directly over the distal interphalangeal joint, and the limb had been elevated and tourniquet inflated.  Deep dissection was carried down through the skin and subcutaneous tissues.  Deep dissection carried all the way down to the flexor tendon and through the extensor tendon.  Amputation was then carried out through the level of the joint.  Local neurectomies were then carried out of the radial neurovascular bundles, carefully retracting the nerves and resecting them and allowing them to retract proximally.  Following neurectomies, the flexor tendon was markedly adherent to the volar plate and to prevent the adhesion of the flexor tendon.  A careful tenolysis was then carried out of the flexor digitorum profundus allowing [place] to retract proximally to avoid any type of quadrigia effect.  The wound was then thoroughly irrigated.  After thorough wound irrigation, V-Y advancement flaps were then carried on the sides in order to obtain closure over the middle phalanx.  Portion of middle phalanx was resected in order to obtain good soft tissue coverage over the bone.  This was done with a small bone cutter.  Skin flaps were then closed with simple Prolene sutures.  Adaptic dressing, sterile compressive bandage then applied.  Tourniquet deflated.  Good perfusion of the skin flaps.  The patient tolerated the procedure well. Returned to the recovery room in good condition.  POSTPROCEDURE PLAN:  The patient discharged home, seen back in the office in approximately 1 week for wound check and then sutures out around the 3-week mark.  Gradually use and activity of the finger. Radiographs at the first visit.     Melrose Nakayama, MD     FWO/MEDQ  D:  10/21/2014  T:  10/22/2014  Job:  [id number]

## 2014-12-17 ENCOUNTER — Encounter (HOSPITAL_COMMUNITY): Payer: Self-pay | Admitting: *Deleted

## 2014-12-17 ENCOUNTER — Emergency Department (HOSPITAL_COMMUNITY)
Admission: EM | Admit: 2014-12-17 | Discharge: 2014-12-17 | Disposition: A | Discharge status: Home / Self Care | Payer: Medicare Other | Attending: Emergency Medicine | Admitting: Emergency Medicine

## 2014-12-17 DIAGNOSIS — K529 Noninfective gastroenteritis and colitis, unspecified: Secondary | ICD-10-CM | POA: Insufficient documentation

## 2014-12-17 DIAGNOSIS — Z8614 Personal history of Methicillin resistant Staphylococcus aureus infection: Secondary | ICD-10-CM | POA: Diagnosis not present

## 2014-12-17 DIAGNOSIS — Z9049 Acquired absence of other specified parts of digestive tract: Secondary | ICD-10-CM | POA: Insufficient documentation

## 2014-12-17 DIAGNOSIS — Z720 Tobacco use: Secondary | ICD-10-CM | POA: Insufficient documentation

## 2014-12-17 DIAGNOSIS — R1110 Vomiting, unspecified: Secondary | ICD-10-CM | POA: Diagnosis present

## 2014-12-17 DIAGNOSIS — Z79899 Other long term (current) drug therapy: Secondary | ICD-10-CM | POA: Insufficient documentation

## 2014-12-17 DIAGNOSIS — Z8739 Personal history of other diseases of the musculoskeletal system and connective tissue: Secondary | ICD-10-CM | POA: Diagnosis not present

## 2014-12-17 NOTE — ED Notes (Signed)
Pt states he got overheated at work yesterday and had 1 episode of emesis, pt was sent home, pt states he feels fine today but needs a work note to return.

## 2014-12-17 NOTE — ED Provider Notes (Signed)
CSN: [id number]     Arrival date & time 12/17/14  1405 History   First MD Initiated Contact with Patient 12/17/14 1520     Chief Complaint  Patient presents with  . Emesis     (Consider location/radiation/quality/duration/timing/severity/associated sxs/prior Treatment) HPI..... Patient had an episode of emesis yesterday for a brief period time. He went home from work and was able to eventually eat. He feels back to normal now. His Boss wants a note for his illness yesterday. Today the patient has no specific complaints. He is eating. No nausea, vomiting, diarrhea, fever.  Past Medical History  Diagnosis Date  . Wears glasses   . Left hand pain   . Osteomyelitis of finger of left hand   . History of MRSA infection     left index finger 03-07-2015 w/ positive blood cultures   Past Surgical History  Procedure Laterality Date  . Cholecystectomy N/A 12/07/2013    Procedure: LAPAROSCOPIC CHOLECYSTECTOMY;  Surgeon: Jamesetta So, MD;  Location: [facility];  Service: General;  Laterality: N/A;  . I&d extremity Left 03/07/2014    Procedure: IRRIGATION AND DEBRIDEMENT INDEX FINGER;  Surgeon: Linna Hoff, MD;  Location: [place];  Service: Orthopedics;  Laterality: Left;  . Amputation Left 03/10/2014    Procedure: Irrigation and Debridement and Revision of Amputation Left Index Finger;  Surgeon: Linna Hoff, MD;  Location: [place];  Service: Orthopedics;  Laterality: Left;  Wants to go at 5PM  . Lumbar disc surgery  2004  . Amputation Left 10/21/2014    Procedure: LEFT LONG FINGER AMPUTATION REVISION;  Surgeon: Iran Planas, MD;  Location: [facility];  Service: Orthopedics;  Laterality: Left;   History reviewed. No pertinent family history. History  Substance Use Topics  . Smoking status: Current Every Day Smoker -- 1.00 packs/day for 20 years    Types: Cigarettes  . Smokeless tobacco: Never Used  . Alcohol Use: Yes     Comment: social    Review of Systems  All other systems reviewed and are  negative.     Allergies  Bactrim; Doxycycline; Sulfites; and Toradol  Home Medications   Prior to Admission medications   Medication Sig Start Date End Date Taking? Authorizing Provider  ALPRAZolam Duanne Moron) 1 MG tablet Take 1 mg by mouth 3 (three) times daily as needed. For anxiety 08/25/14  Yes Historical Provider, MD  Multiple Vitamin (MULTIVITAMIN WITH MINERALS) TABS tablet Take 1 tablet by mouth daily.   Yes Historical Provider, MD  oxyCODONE-acetaminophen (PERCOCET) 10-325 MG per tablet Take 1 tablet by mouth every 4 (four) hours as needed for pain. 10/21/14  Yes Iran Planas, MD  clindamycin (CLEOCIN) 300 MG capsule Take 1 capsule (300 mg total) by mouth 3 (three) times daily. Patient not taking: Reported on 12/17/2014 10/21/14   Iran Planas, MD   BP 114/62 mmHg  Pulse 83  Temp(Src) 98.1 F (36.7 C) (Oral)  Resp 18  Ht 6\' 6"  (1.981 m)  Wt 260 lb (117.935 kg)  BMI 30.05 kg/m2  SpO2 100% Physical Exam  Constitutional: He is oriented to person, place, and time. He appears well-developed and well-nourished.  HENT:  Head: Normocephalic and atraumatic.  Eyes: Conjunctivae and EOM are normal. Pupils are equal, round, and reactive to light.  Neck: Normal range of motion. Neck supple.  Cardiovascular: Normal rate and regular rhythm.   Pulmonary/Chest: Effort normal and breath sounds normal.  Abdominal: Soft. Bowel sounds are normal.  Musculoskeletal: Normal range of motion.  Neurological: He  is alert and oriented to person, place, and time.  Skin: Skin is warm and dry.  Psychiatric: He has a normal mood and affect. His behavior is normal.  Nursing note and vitals reviewed.   ED Course  Procedures (including critical care time) Labs Review Labs Reviewed - No data to display  Imaging Review No results found.   EKG Interpretation None      MDM   Final diagnoses:  Gastroenteritis    No imaging or labs necessary.  Work note given.    Nat Christen, MD 12/17/14  Tresa Moore

## 2014-12-17 NOTE — ED Notes (Signed)
Pt made aware to return if symptoms worsen or if any life threatening symptoms occur.   

## 2014-12-25 ENCOUNTER — Encounter (HOSPITAL_COMMUNITY): Payer: Self-pay | Admitting: *Deleted

## 2014-12-25 ENCOUNTER — Emergency Department (HOSPITAL_COMMUNITY)
Admission: EM | Admit: 2014-12-25 | Discharge: 2014-12-25 | Disposition: A | Discharge status: Home / Self Care | Payer: Medicare Other | Attending: Emergency Medicine | Admitting: Emergency Medicine

## 2014-12-25 ENCOUNTER — Emergency Department (HOSPITAL_COMMUNITY): Payer: Medicare Other

## 2014-12-25 DIAGNOSIS — Z973 Presence of spectacles and contact lenses: Secondary | ICD-10-CM | POA: Insufficient documentation

## 2014-12-25 DIAGNOSIS — Y9389 Activity, other specified: Secondary | ICD-10-CM | POA: Diagnosis not present

## 2014-12-25 DIAGNOSIS — Z720 Tobacco use: Secondary | ICD-10-CM | POA: Diagnosis not present

## 2014-12-25 DIAGNOSIS — S4992XA Unspecified injury of left shoulder and upper arm, initial encounter: Secondary | ICD-10-CM | POA: Insufficient documentation

## 2014-12-25 DIAGNOSIS — Y9289 Other specified places as the place of occurrence of the external cause: Secondary | ICD-10-CM | POA: Insufficient documentation

## 2014-12-25 DIAGNOSIS — Z79899 Other long term (current) drug therapy: Secondary | ICD-10-CM | POA: Diagnosis not present

## 2014-12-25 DIAGNOSIS — Z8614 Personal history of Methicillin resistant Staphylococcus aureus infection: Secondary | ICD-10-CM | POA: Diagnosis not present

## 2014-12-25 DIAGNOSIS — W010XXA Fall on same level from slipping, tripping and stumbling without subsequent striking against object, initial encounter: Secondary | ICD-10-CM | POA: Diagnosis not present

## 2014-12-25 DIAGNOSIS — M25512 Pain in left shoulder: Secondary | ICD-10-CM

## 2014-12-25 DIAGNOSIS — Y990 Civilian activity done for income or pay: Secondary | ICD-10-CM | POA: Insufficient documentation

## 2014-12-25 MED ORDER — MELOXICAM 7.5 MG PO TABS: 7.5000 mg | ORAL_TABLET | Freq: Every day | ORAL | Status: DC

## 2014-12-25 NOTE — ED Notes (Signed)
Patient with no complaints at this time. Respirations even and unlabored. Skin warm/dry. Discharge instructions reviewed with patient at this time. Patient given opportunity to voice concerns/ask questions. Patient discharged at this time and left Emergency Department with steady gait.   

## 2014-12-25 NOTE — Discharge Instructions (Signed)
Read the information below.  Use the prescribed medication as directed.  Please discuss all new medications with your pharmacist.  You may return to the Emergency Department at any time for worsening condition or any new symptoms that concern you.    If you develop uncontrolled pain, weakness or numbness of the extremity, severe discoloration of the skin, or you are unable to move your arm, return to the ER for a recheck.      Acromioclavicular Injuries The AC (acromioclavicular) joint is the joint in the shoulder where the collarbone (clavicle) meets the shoulder blade (scapula). The part of the shoulder blade connected to the collarbone is called the acromion. Common problems with and treatments for the [facility] joint are detailed below. ARTHRITIS Arthritis occurs when the joint has been injured and the smooth padding between the joints (cartilage) is lost. This is the wear and tear seen in most joints of the body if they have been overused. This causes the joint to produce pain and swelling which is worse with activity.  AC JOINT SEPARATION AC joint separation means that the ligaments connecting the acromion of the shoulder blade and collarbone have been damaged, and the two bones no longer line up. AC separations can be anywhere from mild to severe, and are "graded" depending upon which ligaments are torn and how badly they are torn.  Grade I Injury: the least damage is done, and the [facility] joint still lines up.  Grade II Injury: damage to the ligaments which reinforce the [facility] joint. In a Grade II injury, these ligaments are stretched but not entirely torn. When stressed, the [facility] joint becomes painful and unstable.  Grade III Injury: AC and secondary ligaments are completely torn, and the collarbone is no longer attached to the shoulder blade. This results in deformity; a prominence of the end of the clavicle. AC JOINT FRACTURE AC joint fracture means that there has been a break in the bones of the [facility] joint,  usually the end of the clavicle. TREATMENT TREATMENT OF AC ARTHRITIS  There is currently no way to replace the cartilage damaged by arthritis. The best way to improve the condition is to decrease the activities which aggravate the problem. Application of ice to the joint helps decrease pain and soreness (inflammation). The use of non-steroidal anti-inflammatory medication is helpful.  If less conservative measures do not work, then cortisone shots (injections) may be used. These are anti-inflammatories; they decrease the soreness in the joint and swelling.  If non-surgical measures fail, surgery may be recommended. The procedure is generally removal of a portion of the end of the clavicle. This is the part of the collarbone closest to your acromion which is stabilized with ligaments to the acromion of the shoulder blade. This surgery may be performed using a tube-like instrument with a light (arthroscope) for looking into a joint. It may also be performed as an open surgery through a small incision by the surgeon. Most patients will have good range of motion within 6 weeks and may return to all activity including sports by 8-12 weeks, barring complications. TREATMENT OF AN AC SEPARATION  The initial treatment is to decrease pain. This is best accomplished by immobilizing the arm in a sling and placing an ice pack to the shoulder for 20 to 30 minutes every 2 hours as needed. As the pain starts to subside, it is important to begin moving the fingers, wrist, elbow and eventually the shoulder in order to prevent a stiff or "frozen"  shoulder. Instruction on when and how much to move the shoulder will be provided by your caregiver. The length of time needed to regain full motion and function depends on the amount or grade of the injury. Recovery from a Grade I AC separation usually takes 10 to 14 days, whereas a Grade III may take 6 to 8 weeks.  Grade I and II separations usually do not require surgery. Even  Grade III injuries usually allow return to full activity with few restrictions. Treatment is also based on the activity demands of the injured shoulder. For example, a high level quarterback with an injured throwing arm will receive more aggressive treatment than someone with a desk job who rarely uses his/her arm for strenuous activities. In some cases, a painful lump may persist which could require a later surgery. Surgery can be very successful, but the benefits must be weighed against the potential risks. TREATMENT OF AN AC JOINT FRACTURE Fracture treatment depends on the type of fracture. Sometimes a splint or sling may be all that is required. Other times surgery may be required for repair. This is more frequently the case when the ligaments supporting the clavicle are completely torn. Your caregiver will help you with these decisions and together you can decide what will be the best treatment. HOME CARE INSTRUCTIONS   Apply ice to the injury for 15-20 minutes each hour while awake for 2 days. Put the ice in a plastic bag and place a towel between the bag of ice and skin.  If a sling has been applied, wear it constantly for as long as directed by your caregiver, even at night. The sling or splint can be removed for bathing or showering or as directed. Be sure to keep the shoulder in the same place as when the sling is on. Do not lift the arm.  If a figure-of-eight splint has been applied it should be tightened gently by another person every day. Tighten it enough to keep the shoulders held back. Allow enough room to place the index finger between the body and strap. Loosen the splint immediately if there is numbness or tingling in the hands.  Take over-the-counter or prescription medicines for pain, discomfort or fever as directed by your caregiver.  If you or your child has received a follow up appointment, it is very important to keep that appointment in order to avoid long term complications,  chronic pain or disability. SEEK MEDICAL CARE IF:   The pain is not relieved with medications.  There is increased swelling or discoloration that continues to get worse rather than better.  You or your child has been unable to follow up as instructed.  There is progressive numbness and tingling in the arm, forearm or hand. SEEK IMMEDIATE MEDICAL CARE IF:   The arm is numb, cold or pale.  There is increasing pain in the hand, forearm or fingers. MAKE SURE YOU:   Understand these instructions.  Will watch your condition.  Will get help right away if you are not doing well or get worse. Document Released: 04/18/2005 Document Revised: 10/01/2011 Document Reviewed: 10/11/2008 [facility] Patient Information 2015 [place], [place]. This information is not intended to replace advice given to you by your health care provider. Make sure you discuss any questions you have with your health care provider.  Musculoskeletal Pain Musculoskeletal pain is muscle and boney aches and pains. These pains can occur in any part of the body. Your caregiver may treat you without knowing the cause  of the pain. They may treat you if blood or urine tests, X-rays, and other tests were normal.  CAUSES There is often not a definite cause or reason for these pains. These pains may be caused by a type of germ (virus). The discomfort may also come from overuse. Overuse includes working out too hard when your body is not fit. Boney aches also come from weather changes. Bone is sensitive to atmospheric pressure changes. HOME CARE INSTRUCTIONS   Ask when your test results will be ready. Make sure you get your test results.  Only take over-the-counter or prescription medicines for pain, discomfort, or fever as directed by your caregiver. If you were given medications for your condition, do not drive, operate machinery or power tools, or sign legal documents for 24 hours. Do not drink alcohol. Do not take sleeping pills or  other medications that may interfere with treatment.  Continue all activities unless the activities cause more pain. When the pain lessens, slowly resume normal activities. Gradually increase the intensity and duration of the activities or exercise.  During periods of severe pain, bed rest may be helpful. Lay or sit in any position that is comfortable.  Putting ice on the injured area.  Put ice in a bag.  Place a towel between your skin and the bag.  Leave the ice on for 15 to 20 minutes, 3 to 4 times a day.  Follow up with your caregiver for continued problems and no reason can be found for the pain. If the pain becomes worse or does not go away, it may be necessary to repeat tests or do additional testing. Your caregiver may need to look further for a possible cause. SEEK IMMEDIATE MEDICAL CARE IF:  You have pain that is getting worse and is not relieved by medications.  You develop chest pain that is associated with shortness or breath, sweating, feeling sick to your stomach (nauseous), or throw up (vomit).  Your pain becomes localized to the abdomen.  You develop any new symptoms that seem different or that concern you. MAKE SURE YOU:   Understand these instructions.  Will watch your condition.  Will get help right away if you are not doing well or get worse. Document Released: 07/09/2005 Document Revised: 10/01/2011 Document Reviewed: 03/13/2013 [facility] Patient Information 2015 [place], [place]. This information is not intended to replace advice given to you by your health care provider. Make sure you discuss any questions you have with your health care provider.

## 2014-12-25 NOTE — ED Notes (Signed)
Pt applying ice pack to LT shoulder.

## 2014-12-25 NOTE — ED Provider Notes (Signed)
CSN: [id number]     Arrival date & time 12/25/14  1424 History   First MD Initiated Contact with Patient 12/25/14 1454     Chief Complaint  Patient presents with  . Shoulder Pain     (Consider location/radiation/quality/duration/timing/severity/associated sxs/prior Treatment) HPI   Pt presents with left shoulder pain that occurred after fall three days ago.  States he was lifting something heavy at work and lost his balance, fell on his left shoulder.  Denies hitting head or LOC.  Has had aching soreness starting at left shoulder and radiating into left elbow.   Denies weakness or numbness of the arm.  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  Denies other injury.  Was delayed in coming in because he had to work the rest of this week.    Past Medical History  Diagnosis Date  . Wears glasses   . Left hand pain   . Osteomyelitis of finger of left hand   . History of MRSA infection     left index finger 03-07-2015 w/ positive blood cultures   Past Surgical History  Procedure Laterality Date  . Cholecystectomy N/A 12/07/2013    Procedure: LAPAROSCOPIC CHOLECYSTECTOMY;  Surgeon: Jamesetta So, MD;  Location: [facility];  Service: General;  Laterality: N/A;  . I&d extremity Left 03/07/2014    Procedure: IRRIGATION AND DEBRIDEMENT INDEX FINGER;  Surgeon: Linna Hoff, MD;  Location: [place];  Service: Orthopedics;  Laterality: Left;  . Amputation Left 03/10/2014    Procedure: Irrigation and Debridement and Revision of Amputation Left Index Finger;  Surgeon: Linna Hoff, MD;  Location: [facility];  Service: Orthopedics;  Laterality: Left;  Wants to go at 5PM  . Lumbar disc surgery  2004  . Amputation Left 10/21/2014    Procedure: LEFT LONG FINGER AMPUTATION REVISION;  Surgeon: Iran Planas, MD;  Location: [facility];  Service: Orthopedics;  Laterality: Left;   No family history on file. History  Substance Use Topics  . Smoking status: Current Every Day  Smoker -- 1.00 packs/day for 20 years    Types: Cigarettes  . Smokeless tobacco: Never Used  . Alcohol Use: Yes     Comment: social    Review of Systems  Constitutional: Negative for fever and chills.  Cardiovascular: Negative for chest pain.  Gastrointestinal: Negative for abdominal pain.  Musculoskeletal: Positive for arthralgias. Negative for neck pain and neck stiffness.  Skin: Negative for color change, rash and wound.  Allergic/Immunologic: Negative for immunocompromised state.  Neurological: Negative for syncope, weakness, numbness and headaches.  Hematological: Does not bruise/bleed easily.  Psychiatric/Behavioral: Negative for self-injury.      Allergies  Bactrim; Doxycycline; Sulfites; and Toradol  Home Medications   Prior to Admission medications   Medication Sig Start Date End Date Taking? Authorizing Provider  ALPRAZolam Duanne Moron) 1 MG tablet Take 1 mg by mouth 3 (three) times daily as needed. For anxiety 08/25/14  Yes Historical Provider, MD  Multiple Vitamin (MULTIVITAMIN WITH MINERALS) TABS tablet Take 1 tablet by mouth daily.   Yes Historical Provider, MD  oxyCODONE-acetaminophen (PERCOCET) 10-325 MG per tablet Take 1 tablet by mouth every 4 (four) hours as needed for pain. 10/21/14  Yes Iran Planas, MD  clindamycin (CLEOCIN) 300 MG capsule Take 1 capsule (300 mg total) by mouth 3 (three) times daily. Patient not taking: Reported on 12/17/2014 10/21/14   Iran Planas, MD  meloxicam (MOBIC) 7.5 MG tablet Take 1 tablet (7.5  mg total) by mouth daily. 12/25/14   Clayton Bibles, PA-C   BP 109/75 mmHg  Pulse 66  Temp(Src) 98.7 F (37.1 C) (Oral)  Resp 22  Ht 6\' 6"  (1.981 m)  Wt 260 lb (117.935 kg)  BMI 30.05 kg/m2  SpO2 100% Physical Exam  Constitutional: He appears well-developed and well-nourished. No distress.  HENT:  Head: Normocephalic and atraumatic.  Neck: Neck supple.  Pulmonary/Chest: Effort normal.  Musculoskeletal:       Left shoulder: He exhibits  tenderness and pain. He exhibits normal range of motion, no swelling, no effusion, no crepitus, no deformity, no laceration, no spasm, normal pulse and normal strength.       Left elbow: Normal.       Left wrist: Normal.       Left hand: Normal.  Left 2nd and 3rd fingers with distal amputations. No erythema, edema, warmth, discharge, or tenderness.  Left shoulder with tenderness over AC joint without skin changes.  Also mild diffuse tenderness throughout lateral upper arm without focal tenderness.    Full AROM of left shoulder and throughout left arm.  Strength 5/5, sensation intact, pulses intact.   Neurological: He is alert.  Skin: He is not diaphoretic.  Nursing note and vitals reviewed.   ED Course  Procedures (including critical care time) Labs Review Labs Reviewed - No data to display  Imaging Review Dg Shoulder Left  12/25/2014   CLINICAL DATA:  Left shoulder pain since an injury on Wednesday.  EXAM: LEFT SHOULDER - 2+ VIEW  COMPARISON:  01/28/2014  FINDINGS: There is no evidence of fracture or dislocation. There is no evidence of arthropathy. Small bone island in the humeral head, not significant. Soft tissues are unremarkable.  IMPRESSION: No significant abnormality.   Electronically Signed   By: Lorriane Shire M.D.   On: 12/25/2014 15:34     EKG Interpretation None      MDM   Final diagnoses:  Left shoulder pain  AC joint pain, left    Afebrile, nontoxic patient with injury to his left shoulder and upper arm after losing his balance and falling onto that side 3 days ago.   Xray negative.  Neurovascularly intact.  He does have some tenderness over left AC joint, pt therefore placed in splint.   D/C home with mobic, PCP/ortho follow up.  Discussed result, findings, treatment, and follow up  with patient.  Pt given return precautions.  Pt verbalizes understanding and agrees with plan.       Clayton Bibles, PA-C 12/25/14 1639  Tanna Furry, MD 01/05/15 [phone]

## 2014-12-25 NOTE — ED Notes (Signed)
Pt states he injured left shoulder Wednesday.

## 2014-12-27 ENCOUNTER — Emergency Department (HOSPITAL_COMMUNITY)
Admission: EM | Admit: 2014-12-27 | Discharge: 2014-12-27 | Disposition: A | Discharge status: Home / Self Care | Payer: Medicare Other | Attending: Emergency Medicine | Admitting: Emergency Medicine

## 2014-12-27 ENCOUNTER — Encounter (HOSPITAL_COMMUNITY): Payer: Self-pay | Admitting: Emergency Medicine

## 2014-12-27 DIAGNOSIS — Z791 Long term (current) use of non-steroidal anti-inflammatories (NSAID): Secondary | ICD-10-CM | POA: Insufficient documentation

## 2014-12-27 DIAGNOSIS — Z8614 Personal history of Methicillin resistant Staphylococcus aureus infection: Secondary | ICD-10-CM | POA: Diagnosis not present

## 2014-12-27 DIAGNOSIS — Z720 Tobacco use: Secondary | ICD-10-CM | POA: Diagnosis not present

## 2014-12-27 DIAGNOSIS — Z8739 Personal history of other diseases of the musculoskeletal system and connective tissue: Secondary | ICD-10-CM | POA: Diagnosis not present

## 2014-12-27 DIAGNOSIS — Z973 Presence of spectacles and contact lenses: Secondary | ICD-10-CM | POA: Diagnosis not present

## 2014-12-27 DIAGNOSIS — R5383 Other fatigue: Secondary | ICD-10-CM | POA: Diagnosis present

## 2014-12-27 DIAGNOSIS — Z79899 Other long term (current) drug therapy: Secondary | ICD-10-CM | POA: Insufficient documentation

## 2014-12-27 LAB — CBG MONITORING, ED: Glucose-Capillary: 93 mg/dL (ref 65–99)

## 2014-12-27 LAB — URINALYSIS, ROUTINE W REFLEX MICROSCOPIC
Bilirubin Urine: NEGATIVE
Glucose, UA: NEGATIVE mg/dL
Hgb urine dipstick: NEGATIVE
KETONES UR: NEGATIVE mg/dL
Leukocytes, UA: NEGATIVE
NITRITE: NEGATIVE
Protein, ur: NEGATIVE mg/dL
UROBILINOGEN UA: 0.2 mg/dL (ref 0.0–1.0)
pH: 5.5 (ref 5.0–8.0)

## 2014-12-27 NOTE — ED Notes (Signed)
PT wanting to be d/c at this time and return to work. EDP made aware and stated he would call pt back if urine was abnormal. Cell phone number for pt given to EDP for follow up for abnormal results.

## 2014-12-27 NOTE — ED Notes (Signed)
Having weakness since 10 am. Seeing white spots.

## 2014-12-27 NOTE — Discharge Instructions (Signed)
I do not have the results of your urine test, you're leaving before the results are finished, if it is abnormal I will attempt to contact you with the phone number that you leave with the nurse. He may return if your symptoms worsen

## 2014-12-27 NOTE — ED Provider Notes (Signed)
CSN: [id number]     Arrival date & time 12/27/14  1259 History   First MD Initiated Contact with Patient 12/27/14 1436     Chief Complaint  Patient presents with  . Fatigue     (Consider location/radiation/quality/duration/timing/severity/associated sxs/prior Treatment) HPI Comments: The patient is a 34 year old male who has been recently seen in the emergency department twice in the last month, initially for gastroenteritis and secondly for a shoulder injury when he fell. He states that while he was at work today he started to feel generally weak and fatigued, lightheaded, he works in an environment in a Engineer, materials, has been trying to keep up with fluid intake but felt generally fatigued. He states that when he checked into the emergency department and sat down in the waiting room he took a nap, when he woke up he was feeling back to normal. He denies any specific complaints including chest pain, shortness of breath, abdominal pain, diarrhea, dysuria, swelling, rashes, headache, blurred vision. He has no difficulty with walking  The history is provided by the patient and medical records.    Past Medical History  Diagnosis Date  . Wears glasses   . Left hand pain   . Osteomyelitis of finger of left hand   . History of MRSA infection     left index finger 03-07-2015 w/ positive blood cultures   Past Surgical History  Procedure Laterality Date  . Cholecystectomy N/A 12/07/2013    Procedure: LAPAROSCOPIC CHOLECYSTECTOMY;  Surgeon: Jamesetta So, MD;  Location: [facility];  Service: General;  Laterality: N/A;  . I&d extremity Left 03/07/2014    Procedure: IRRIGATION AND DEBRIDEMENT INDEX FINGER;  Surgeon: Linna Hoff, MD;  Location: [place];  Service: Orthopedics;  Laterality: Left;  . Amputation Left 03/10/2014    Procedure: Irrigation and Debridement and Revision of Amputation Left Index Finger;  Surgeon: Linna Hoff, MD;  Location: [facility];  Service: Orthopedics;   Laterality: Left;  Wants to go at 5PM  . Lumbar disc surgery  2004  . Amputation Left 10/21/2014    Procedure: LEFT LONG FINGER AMPUTATION REVISION;  Surgeon: Iran Planas, MD;  [facility];  Service: Orthopedics;  Laterality: Left;   History reviewed. No pertinent family history. History  Substance Use Topics  . Smoking status: Current Every Day Smoker -- 1.00 packs/day for 20 years    Types: Cigarettes  . Smokeless tobacco: Never Used  . Alcohol Use: Yes     Comment: social    Review of Systems  All other systems reviewed and are negative.     Allergies  Bactrim; Doxycycline; Sulfites; and Toradol  Home Medications   Prior to Admission medications   Medication Sig Start Date End Date Taking? Authorizing Provider  ALPRAZolam Duanne Moron) 1 MG tablet Take 1 mg by mouth 3 (three) times daily as needed. For anxiety 08/25/14  Yes Historical Provider, MD  meloxicam (MOBIC) 7.5 MG tablet Take 1 tablet (7.5 mg total) by mouth daily. 12/25/14  Yes Clayton Bibles, PA-C  Multiple Vitamin (MULTIVITAMIN WITH MINERALS) TABS tablet Take 1 tablet by mouth daily.   Yes Historical Provider, MD  oxyCODONE-acetaminophen (PERCOCET) 10-325 MG per tablet Take 1 tablet by mouth every 4 (four) hours as needed for pain. 10/21/14  Yes Iran Planas, MD   BP 116/69 mmHg  Pulse 68  Temp(Src) 98.6 F (37 C) (Oral)  Resp 18  Ht 6\' 6"  (1.981 m)  Wt 260 lb (117.935 kg)  BMI  30.05 kg/m2  SpO2 98% Physical Exam  Constitutional: He appears well-developed and well-nourished. No distress.  HENT:  Head: Normocephalic and atraumatic.  Mouth/Throat: Oropharynx is clear and moist. No oropharyngeal exudate.  Eyes: Conjunctivae and EOM are normal. Pupils are equal, round, and reactive to light. Right eye exhibits no discharge. Left eye exhibits no discharge. No scleral icterus.  Neck: Normal range of motion. Neck supple. No JVD present. No thyromegaly present.  Cardiovascular: Normal rate, regular rhythm, normal heart  sounds and intact distal pulses.  Exam reveals no gallop and no friction rub.   No murmur heard. Pulmonary/Chest: Effort normal and breath sounds normal. No respiratory distress. He has no wheezes. He has no rales.  Abdominal: Soft. Bowel sounds are normal. He exhibits no distension and no mass. There is no tenderness.  Musculoskeletal: Normal range of motion. He exhibits no edema or tenderness.  Lymphadenopathy:    He has no cervical adenopathy.  Neurological: He is alert. Coordination normal.  Skin: Skin is warm and dry. No rash noted. No erythema.  Psychiatric: He has a normal mood and affect. His behavior is normal.  Nursing note and vitals reviewed.   ED Course  Procedures (including critical care time) Labs Review Labs Reviewed  URINALYSIS, ROUTINE W REFLEX MICROSCOPIC (NOT AT [facility])  CBG MONITORING, ED    Imaging Review No results found.   EKG Interpretation   Date/Time:  Monday December 27 2014 14:54:26 EDT Ventricular Rate:  53 PR Interval:  204 QRS Duration: 100 QT Interval:  443 QTC Calculation: 416 R Axis:   52 Text Interpretation:  Sinus rhythm Borderline prolonged PR interval since  last tracing no significant change Confirmed by Sabra Heck  MD,  ([id number])  on 12/27/2014 3:25:14 PM      MDM   Final diagnoses:  Other fatigue    The patient's exam is unremarkable, neurologically the patient is normal, vital signs show normal pulse, blood pressure is temperature, there is no tachycardia, no hypotension, no fever, he has no abdominal tenderness or plantar and findings and appears back to normal according to the patient's report. We'll check urinalysis, CBG was normal, orthostatics. Anticipate discharge if no acute findings to suggest further workup or found.  Pt wants to leave before results completed - appears stable - no orthostatic changes - well appearing, ambulatory without difficulty.  Noemi Chapel, MD 12/27/14 [phone]

## 2014-12-27 NOTE — ED Notes (Addendum)
PT c/o increased generalized weakness if the evenings. 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 PT states he feels better after sleeping in the waiting room today and he hopes to be able to return to work this evening.

## 2014-12-27 NOTE — ED Provider Notes (Signed)
Pt contacted and informed of urine results  Noemi Chapel, MD 12/27/14 [phone]

## 2014-12-27 NOTE — ED Notes (Signed)
cbg of 93.

## 2015-03-16 ENCOUNTER — Encounter (HOSPITAL_COMMUNITY): Payer: Self-pay | Admitting: Emergency Medicine

## 2015-03-16 ENCOUNTER — Emergency Department (HOSPITAL_COMMUNITY)
Admission: EM | Admit: 2015-03-16 | Discharge: 2015-03-16 | Disposition: A | Discharge status: Home / Self Care | Payer: Medicare Other | Attending: Emergency Medicine | Admitting: Emergency Medicine

## 2015-03-16 DIAGNOSIS — T7840XA Allergy, unspecified, initial encounter: Secondary | ICD-10-CM | POA: Insufficient documentation

## 2015-03-16 DIAGNOSIS — Z791 Long term (current) use of non-steroidal anti-inflammatories (NSAID): Secondary | ICD-10-CM | POA: Diagnosis not present

## 2015-03-16 DIAGNOSIS — X58XXXA Exposure to other specified factors, initial encounter: Secondary | ICD-10-CM | POA: Insufficient documentation

## 2015-03-16 DIAGNOSIS — Y9389 Activity, other specified: Secondary | ICD-10-CM | POA: Insufficient documentation

## 2015-03-16 DIAGNOSIS — Z720 Tobacco use: Secondary | ICD-10-CM | POA: Insufficient documentation

## 2015-03-16 DIAGNOSIS — Y990 Civilian activity done for income or pay: Secondary | ICD-10-CM | POA: Insufficient documentation

## 2015-03-16 DIAGNOSIS — Z973 Presence of spectacles and contact lenses: Secondary | ICD-10-CM | POA: Diagnosis not present

## 2015-03-16 DIAGNOSIS — Y9289 Other specified places as the place of occurrence of the external cause: Secondary | ICD-10-CM | POA: Insufficient documentation

## 2015-03-16 DIAGNOSIS — Z8614 Personal history of Methicillin resistant Staphylococcus aureus infection: Secondary | ICD-10-CM | POA: Insufficient documentation

## 2015-03-16 DIAGNOSIS — R21 Rash and other nonspecific skin eruption: Secondary | ICD-10-CM | POA: Diagnosis present

## 2015-03-16 MED ORDER — TRIAMCINOLONE ACETONIDE 0.1 % EX LOTN: 1.0000 "application " | TOPICAL_LOTION | Freq: Three times a day (TID) | CUTANEOUS | Status: DC

## 2015-03-16 NOTE — ED Notes (Signed)
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 Pt reports was using de-greaser that had acid components in it. Pt reports went home last night and took a shower. Pt reports itching sensation ever since. Abrasions noted to right forearm and hand.

## 2015-03-16 NOTE — Discharge Instructions (Signed)

## 2015-03-16 NOTE — ED Provider Notes (Signed)
CSN: [id number]     Arrival date & time 03/16/15  1059 History   First MD Initiated Contact with Patient 03/16/15 1119     Chief Complaint  Patient presents with  . Chemical Exposure     (Consider location/radiation/quality/duration/timing/severity/associated sxs/prior Treatment) Patient is a 34 y.o. male presenting with rash. The history is provided by the patient. No language interpreter was used.  Rash Location:  Hand Hand rash location:  L hand, R hand, L wrist and R wrist Quality: itchiness and redness   Severity:  Moderate Onset quality:  Gradual Duration:  1 day Timing:  Constant Progression:  Worsening Chronicity:  New Context: new detergent/soap   Relieved by:  Nothing Worsened by:  Nothing tried Ineffective treatments:  None tried Associated symptoms: not wheezing     Past Medical History  Diagnosis Date  . Wears glasses   . Left hand pain   . Osteomyelitis of finger of left hand   . History of MRSA infection     left index finger 03-07-2015 w/ positive blood cultures   Past Surgical History  Procedure Laterality Date  . Cholecystectomy N/A 12/07/2013    Procedure: LAPAROSCOPIC CHOLECYSTECTOMY;  Surgeon: Jamesetta So, MD;  Location: [facility];  Service: General;  Laterality: N/A;  . I&d extremity Left 03/07/2014    Procedure: IRRIGATION AND DEBRIDEMENT INDEX FINGER;  Surgeon: Linna Hoff, MD;  Location: [facility];  Service: Orthopedics;  Laterality: Left;  . Amputation Left 03/10/2014    Procedure: Irrigation and Debridement and Revision of Amputation Left Index Finger;  Surgeon: Linna Hoff, MD;  Location: [facility];  Service: Orthopedics;  Laterality: Left;  Wants to go at 5PM  . Lumbar disc surgery  2004  . Amputation Left 10/21/2014    Procedure: LEFT LONG FINGER AMPUTATION REVISION;  Surgeon: Iran Planas, MD;  Location: [facility];  Service: Orthopedics;  Laterality: Left;   History reviewed. No pertinent family history. Social History  Substance Use Topics  .  Smoking status: Current Every Day Smoker -- 1.00 packs/day for 20 years    Types: Cigarettes  . Smokeless tobacco: Never Used  . Alcohol Use: Yes     Comment: social    Review of Systems  Respiratory: Negative for wheezing.   Skin: Positive for rash.  All other systems reviewed and are negative.     Allergies  Bactrim; Doxycycline; Sulfites; and Toradol  Home Medications   Prior to Admission medications   Medication Sig Start Date End Date Taking? Authorizing Provider  ALPRAZolam Duanne Moron) 1 MG tablet Take 1 mg by mouth 3 (three) times daily as needed. For anxiety 08/25/14   Historical Provider, MD  meloxicam (MOBIC) 7.5 MG tablet Take 1 tablet (7.5 mg total) by mouth daily. 12/25/14   Clayton Bibles, PA-C  Multiple Vitamin (MULTIVITAMIN WITH MINERALS) TABS tablet Take 1 tablet by mouth daily.    Historical Provider, MD  oxyCODONE-acetaminophen (PERCOCET) 10-325 MG per tablet Take 1 tablet by mouth every 4 (four) hours as needed for pain. 10/21/14   Iran Planas, MD  triamcinolone lotion (KENALOG) 0.1 % Apply 1 application topically 3 (three) times daily. 03/16/15   Fransico Meadow, PA-C   BP 129/84 mmHg  Pulse 62  Temp(Src) 98.6 F (37 C) (Oral)  Resp 18  Ht 6\' 6"  (1.981 m)  Wt 260 lb (117.935 kg)  BMI 30.05 kg/m2  SpO2 99% Physical Exam  Constitutional: He is oriented to person, place, and time. He appears well-developed and well-nourished.  HENT:  Head: Normocephalic.  Eyes: EOM are normal.  Neck: Normal range of motion.  Cardiovascular: Normal rate.   Pulmonary/Chest: Effort normal.  Abdominal: He exhibits no distension.  Musculoskeletal: Normal range of motion.  Neurological: He is alert and oriented to person, place, and time.  Skin: There is erythema.  Dried erythematous, linear areas of rash bilat hands  Psychiatric: He has a normal mood and affect.  Nursing note and vitals reviewed.   ED Course  Procedures (including critical care time) Labs Review Labs Reviewed -  No data to display  Imaging Review No results found. I have personally reviewed and evaluated these images and lab results as part of my medical decision-making.   EKG Interpretation None      MDM     Final diagnoses:  Allergic reaction, initial encounter    Triamcinalone lotion Return to work note for tomorrow.     [place], PA-C 03/16/15 1138

## 2015-03-20 NOTE — ED Provider Notes (Signed)
03/16/15  Medical screening examination/treatment/procedure(s) were performed by non-physician practitioner and as supervising physician I was immediately available for consultation/collaboration.   Carmin Muskrat, MD 03/20/15 [phone]

## 2015-05-10 IMAGING — CR DG HAND COMPLETE 3+V*L*
3 series · 3 of 3 positions shown · non-contrast
Comparison: None.

CLINICAL DATA: Pain, swelling and erythema at a wound between the
index and middle fingers of the left hand. Purulent discharge.

EXAM:
LEFT HAND - COMPLETE 3+ VIEW

[view not recorded (1 of 3)]
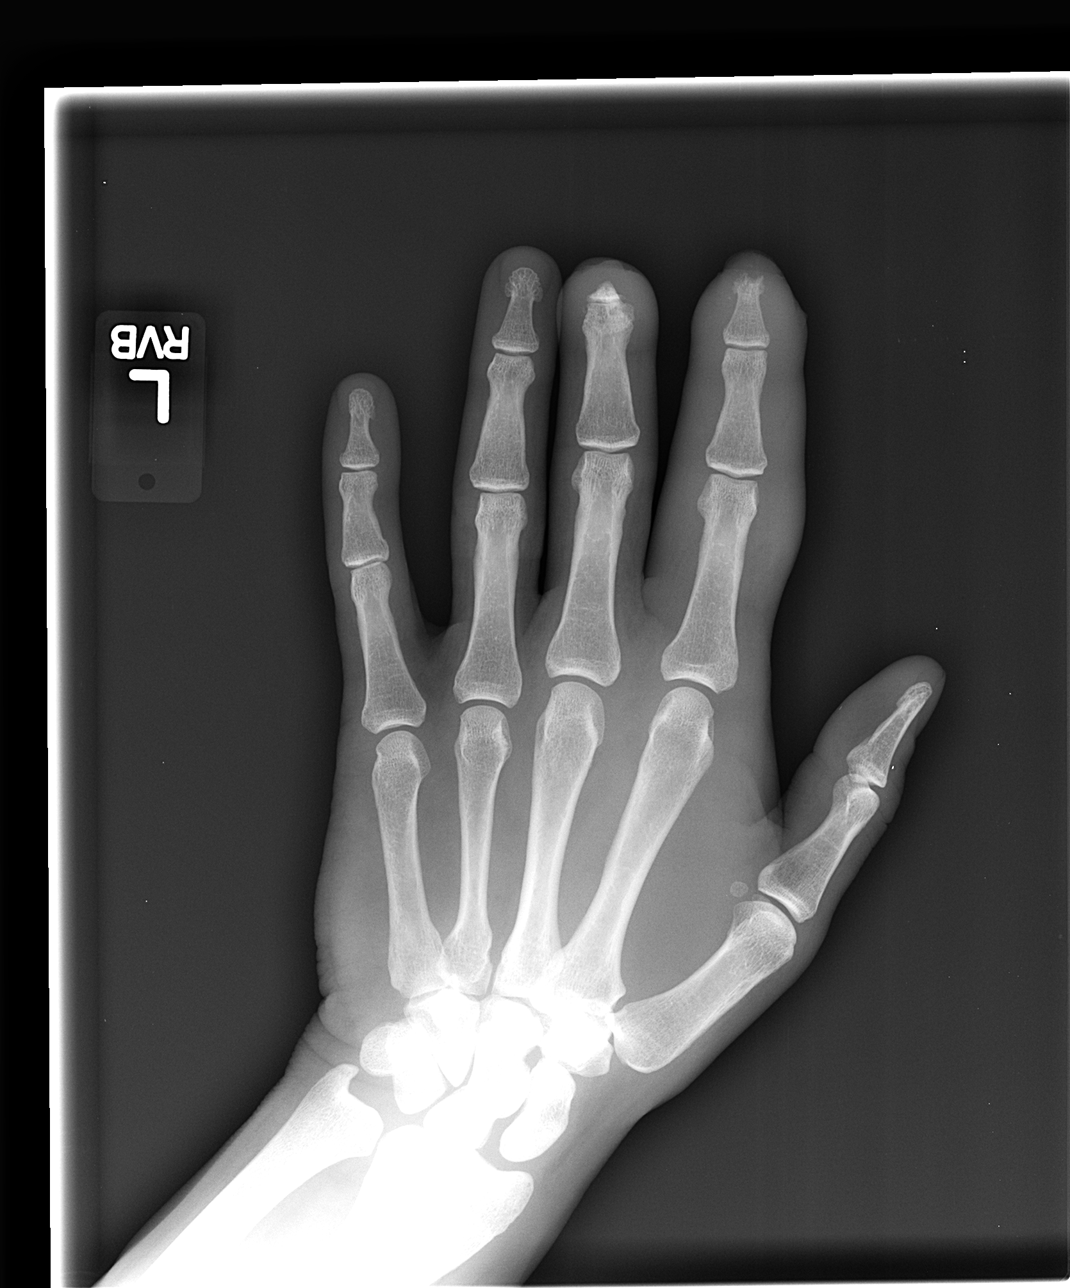

[view not recorded (2 of 3)]
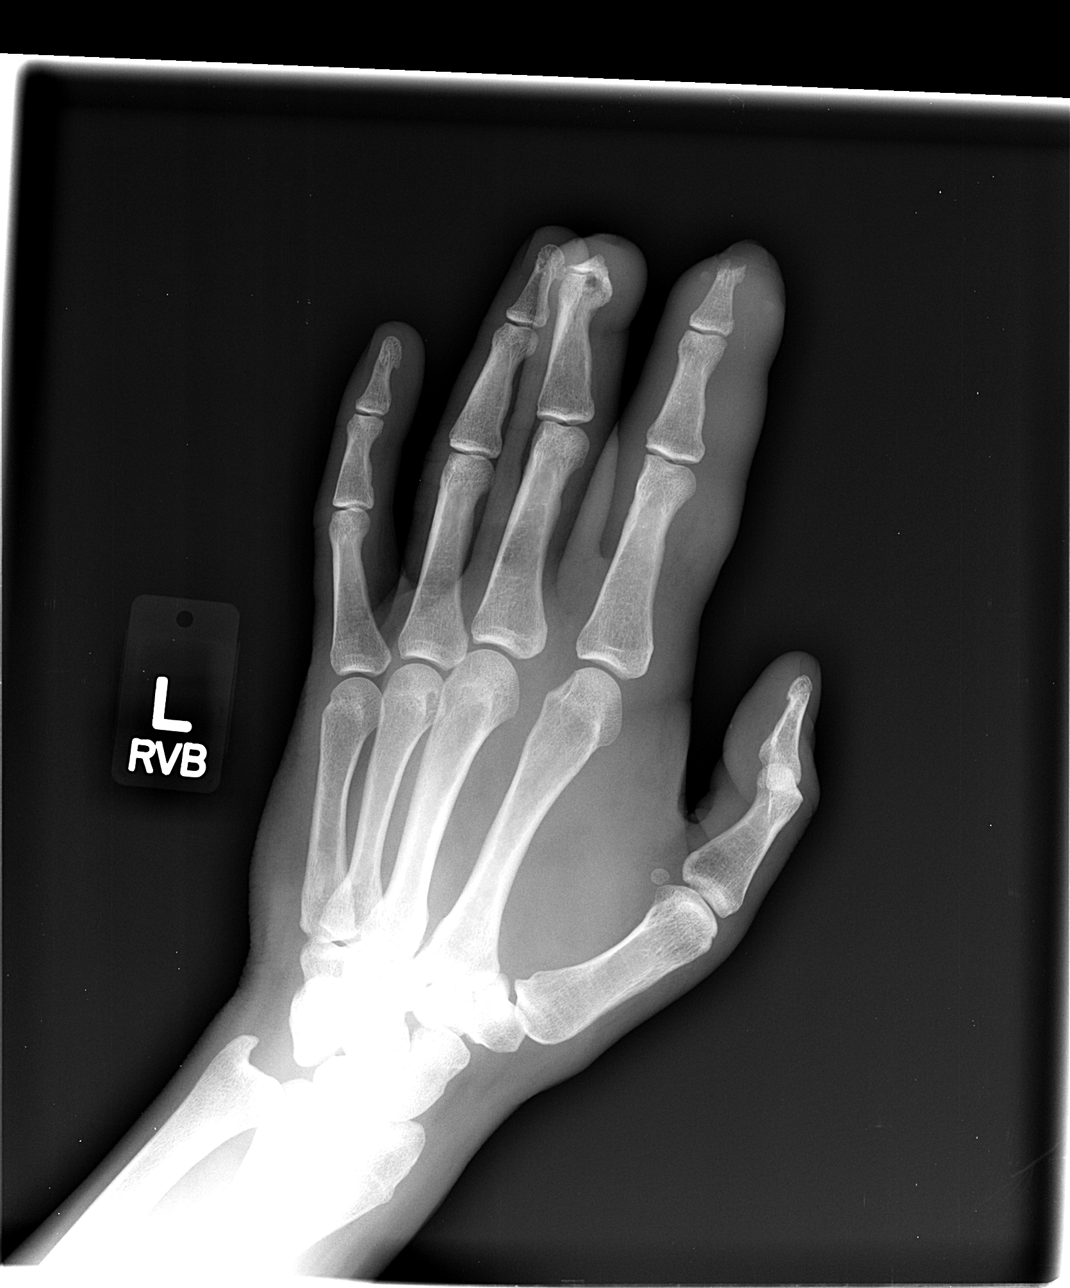

[view not recorded (3 of 3)]
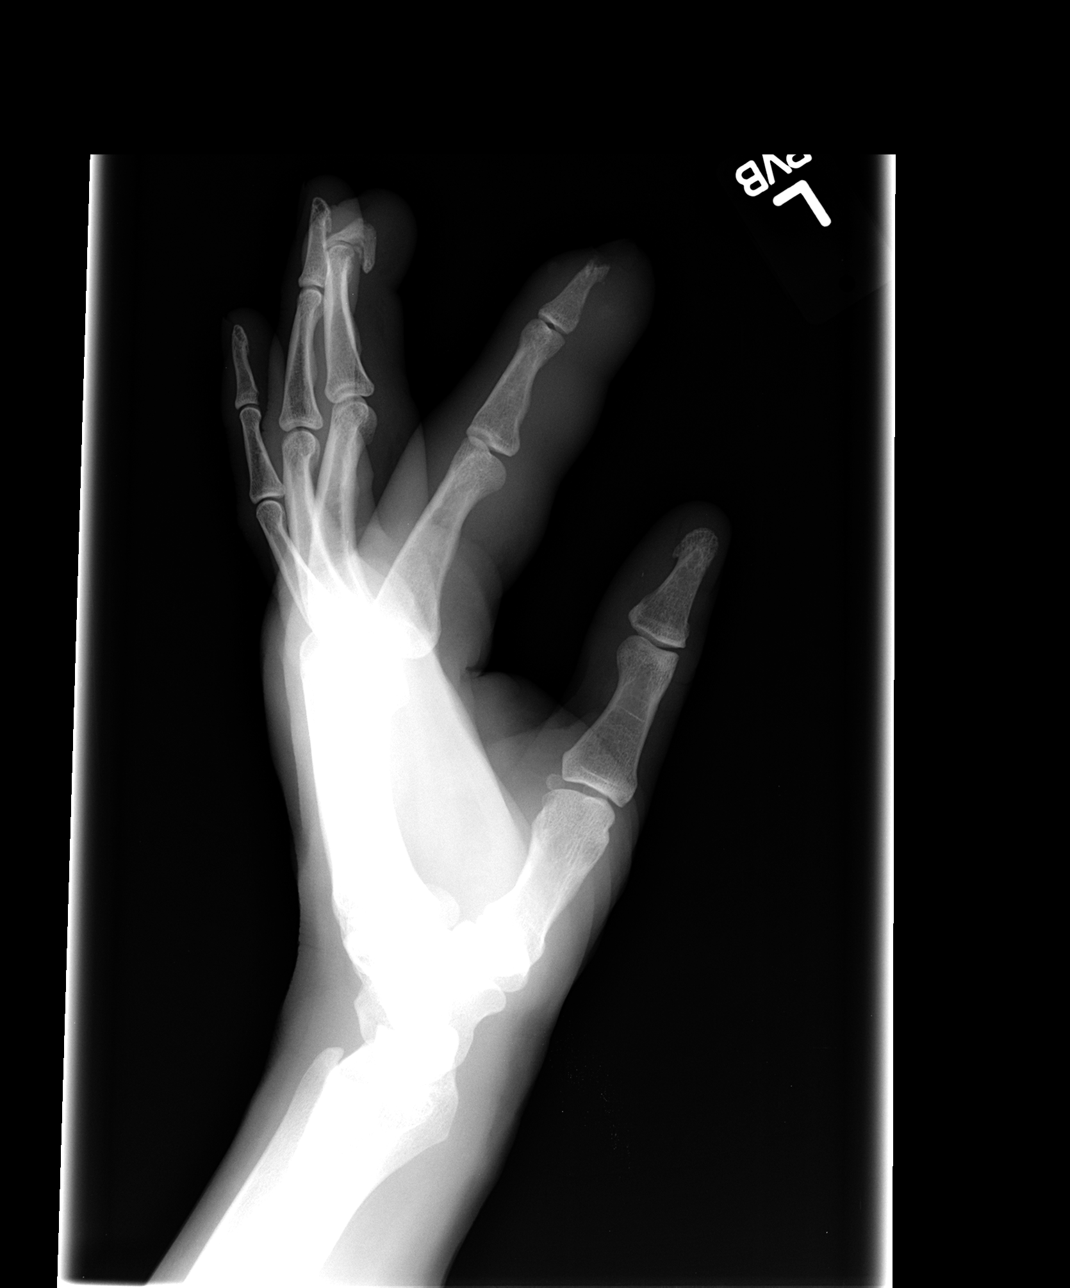

[3 of 3 positions shown; findings below may reference images not displayed]

FINDINGS: The known soft tissue wound is not well characterized. There is
diffuse soft tissue swelling about the second and third
metacarpophalangeal joints, and along the second finger. No
radiopaque foreign bodies are seen. There is no evidence of osseous
erosion at this time.

Visualized joint spaces are preserved. The carpal rows appear
grossly intact, and demonstrate normal alignment. There is chronic
amputation involving the distal tuft of the second digit and much of
the the third distal phalanx.
IMPRESSION: 1. Known soft tissue wound is not well characterized. No radiopaque
foreign bodies seen. Diffuse soft tissue swelling about the second
and third metacarpophalangeal joints, and along the second finger.
2. No evidence of osseous erosion at this time.
3. For chronic amputation involving the distal tuft of the second
digit and much of the third distal phalanx.

## 2016-02-28 IMAGING — DX DG SHOULDER 2+V*L*
4 series · 4 of 4 positions shown · non-contrast
Comparison: 01/28/2014

CLINICAL DATA: Left shoulder pain since an injury on [REDACTED].

EXAM:
LEFT SHOULDER - 2+ VIEW

[shoulder grashey]
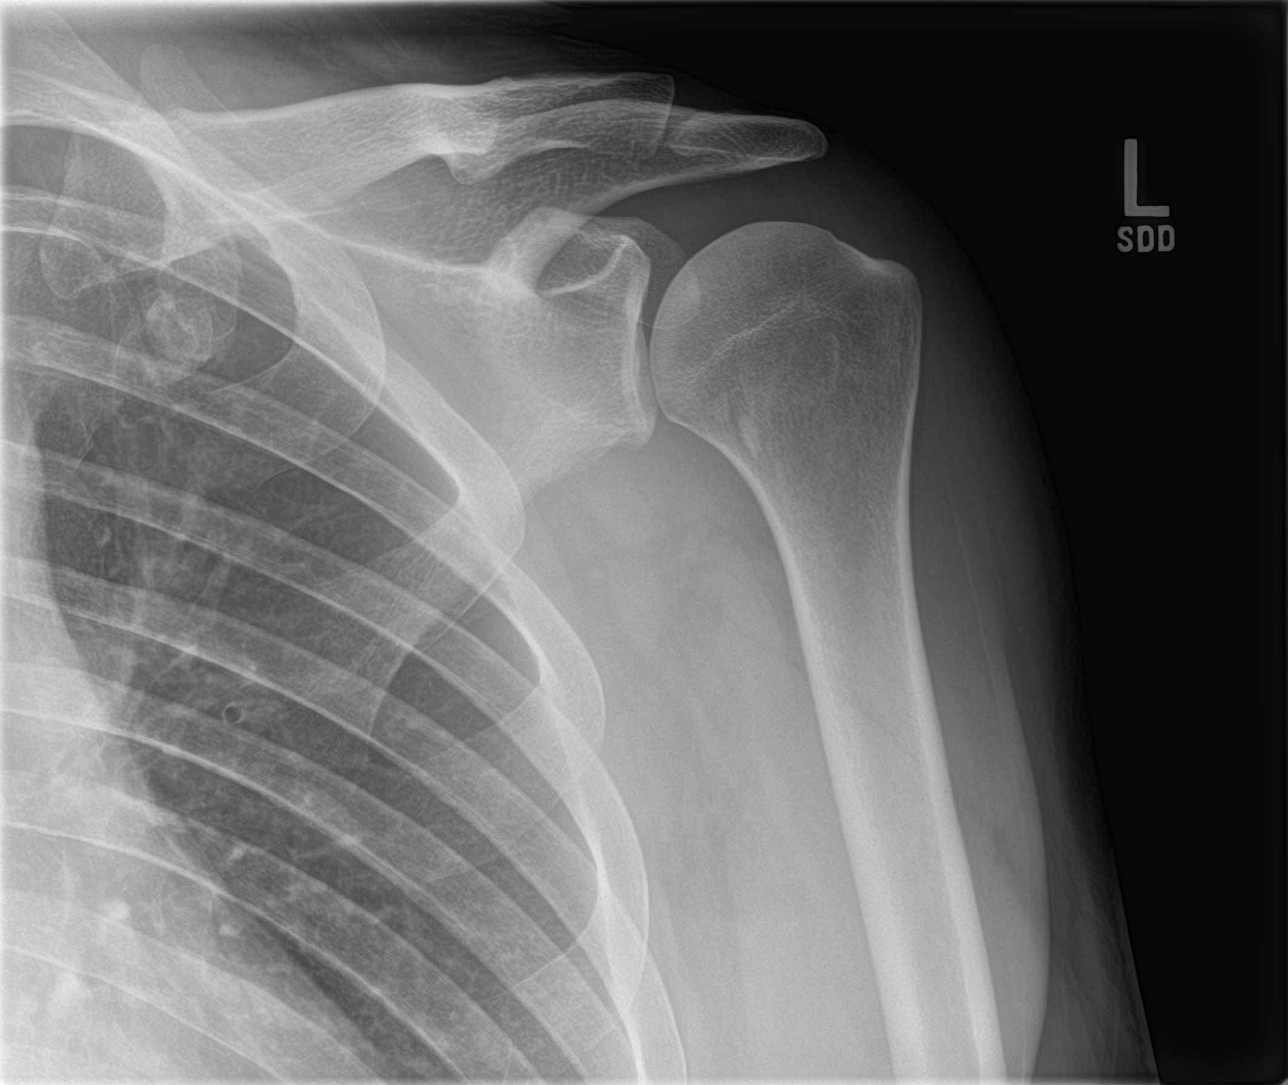

[shoulder y view (1 of 2)]
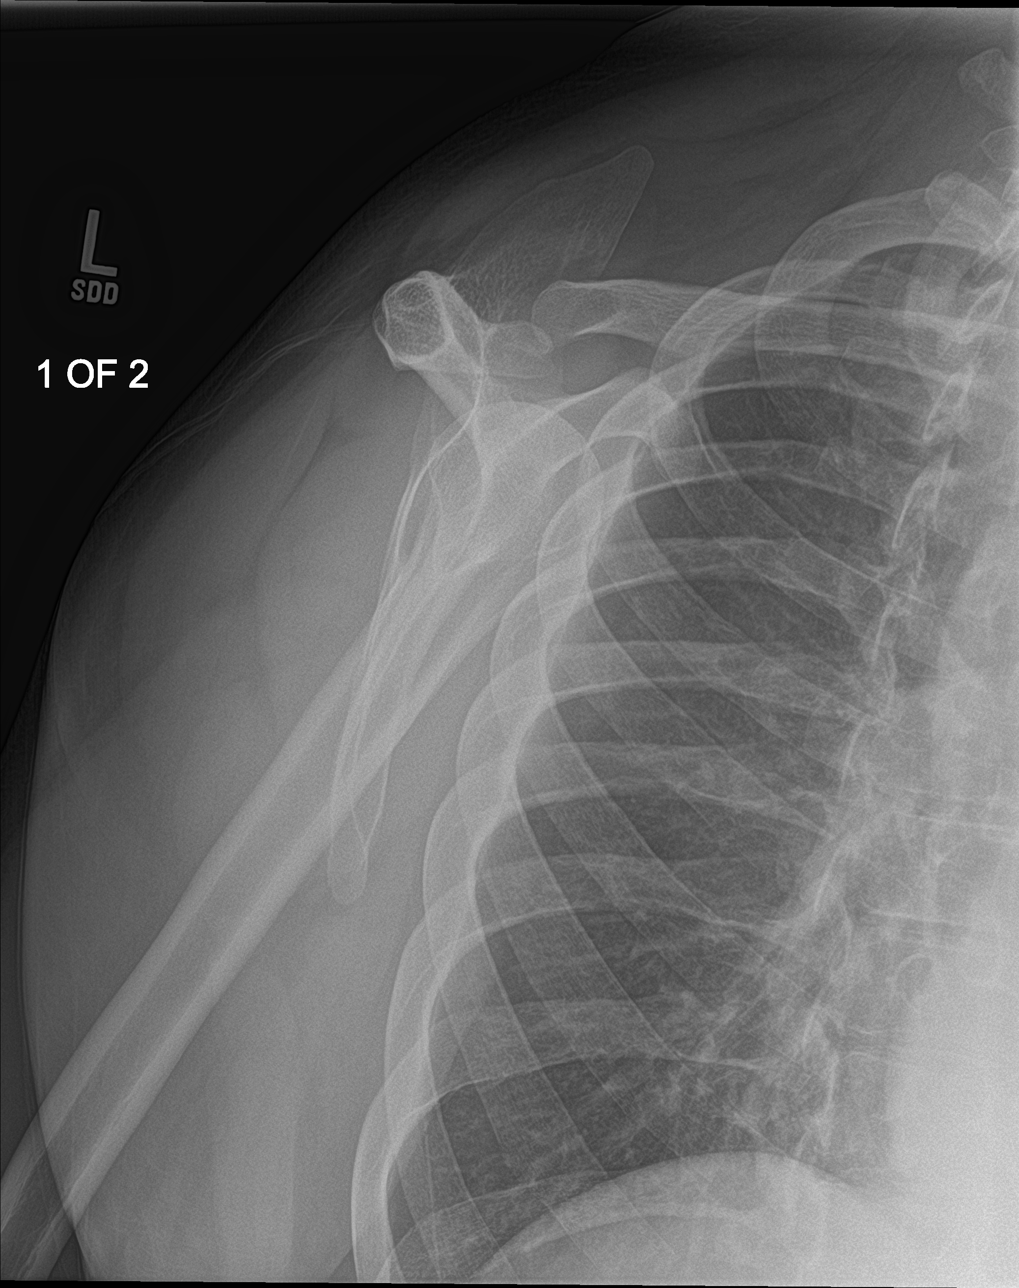

[shoulder axillary]
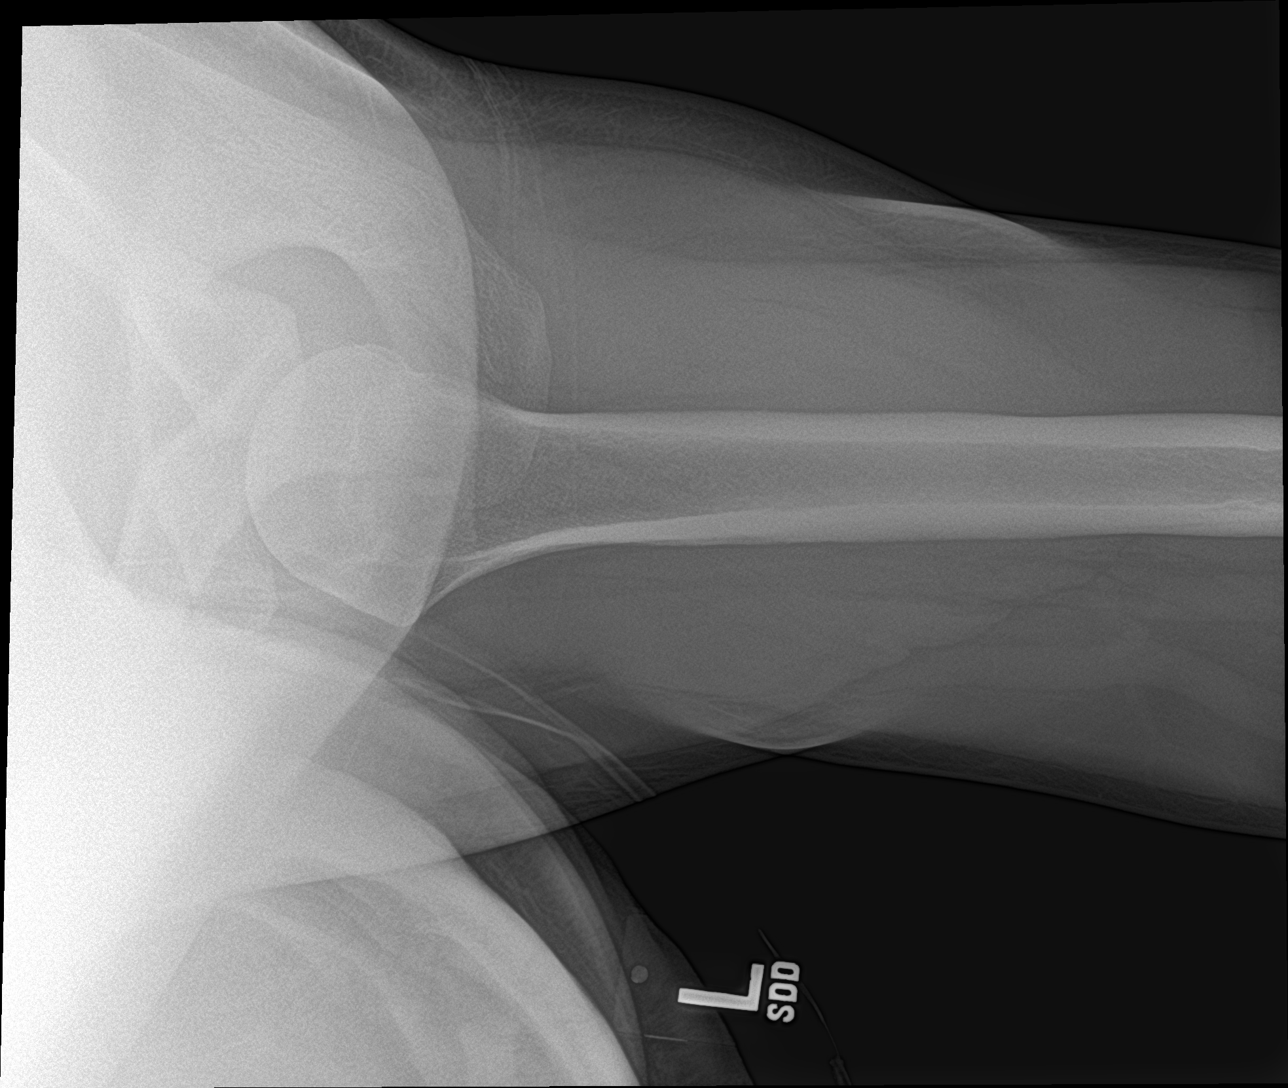

[shoulder y view (2 of 2)]
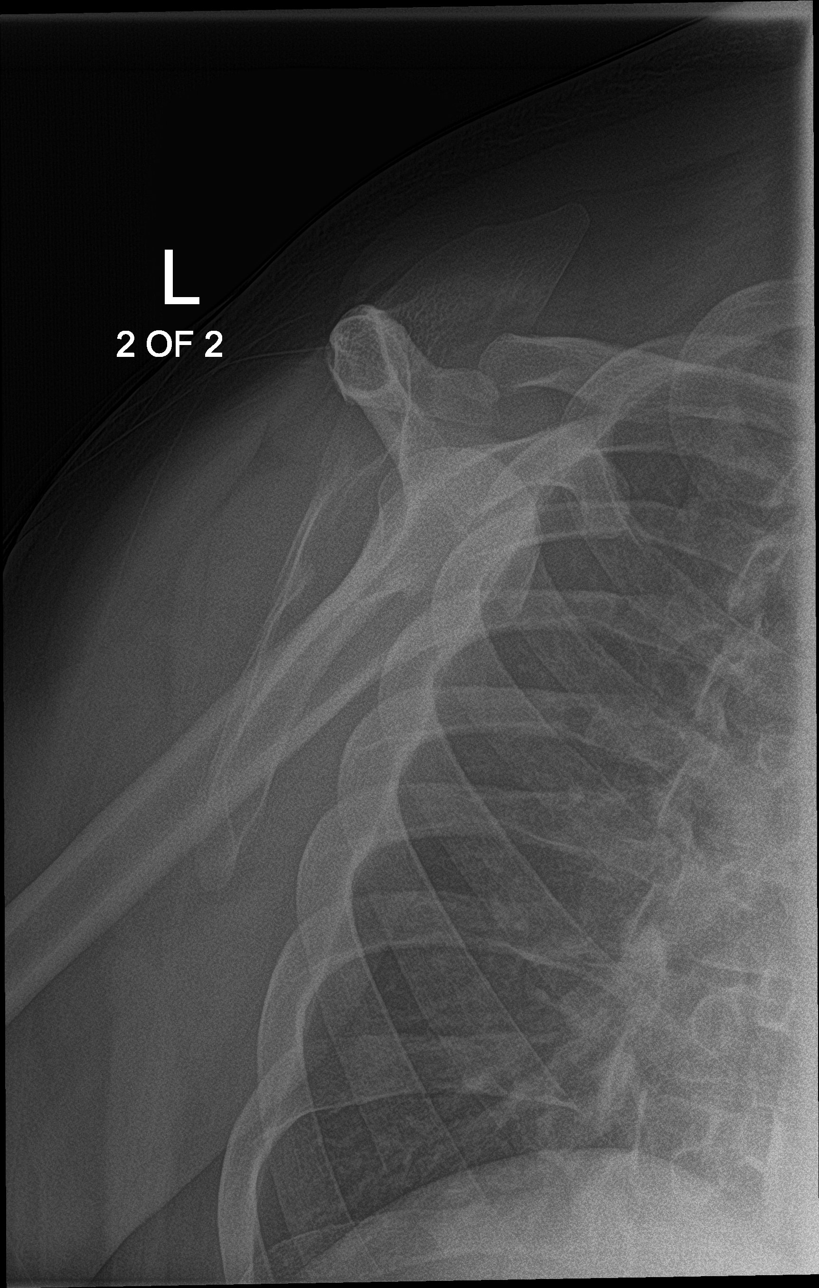

[4 of 4 positions shown; findings below may reference images not displayed]

FINDINGS: There is no evidence of fracture or dislocation. There is no
evidence of arthropathy. Small bone island in the humeral head, not
significant. Soft tissues are unremarkable.
IMPRESSION: No significant abnormality.

## 2016-08-22 DIAGNOSIS — F3162 Bipolar disorder, current episode mixed, moderate: Secondary | ICD-10-CM | POA: Diagnosis not present

## 2016-08-24 DIAGNOSIS — S68611D Complete traumatic transphalangeal amputation of left index finger, subsequent encounter: Secondary | ICD-10-CM | POA: Diagnosis not present

## 2016-10-17 DIAGNOSIS — F3162 Bipolar disorder, current episode mixed, moderate: Secondary | ICD-10-CM | POA: Diagnosis not present

## 2016-11-14 DIAGNOSIS — F3162 Bipolar disorder, current episode mixed, moderate: Secondary | ICD-10-CM | POA: Diagnosis not present

## 2016-12-19 DIAGNOSIS — F3162 Bipolar disorder, current episode mixed, moderate: Secondary | ICD-10-CM | POA: Diagnosis not present

## 2017-01-26 ENCOUNTER — Emergency Department (HOSPITAL_COMMUNITY)
Admission: EM | Admit: 2017-01-26 | Discharge: 2017-01-26 | Disposition: A | Discharge status: Home / Self Care | Payer: Medicare Other | Attending: Emergency Medicine | Admitting: Emergency Medicine

## 2017-01-26 ENCOUNTER — Encounter (HOSPITAL_COMMUNITY): Payer: Self-pay

## 2017-01-26 DIAGNOSIS — Z8719 Personal history of other diseases of the digestive system: Secondary | ICD-10-CM | POA: Insufficient documentation

## 2017-01-26 DIAGNOSIS — F17210 Nicotine dependence, cigarettes, uncomplicated: Secondary | ICD-10-CM | POA: Insufficient documentation

## 2017-01-26 DIAGNOSIS — M545 Low back pain: Secondary | ICD-10-CM | POA: Diagnosis present

## 2017-01-26 DIAGNOSIS — M5416 Radiculopathy, lumbar region: Secondary | ICD-10-CM | POA: Diagnosis not present

## 2017-01-26 DIAGNOSIS — M5417 Radiculopathy, lumbosacral region: Secondary | ICD-10-CM | POA: Insufficient documentation

## 2017-01-26 MED ORDER — METAXALONE 800 MG PO TABS: 800.0000 mg | ORAL_TABLET | Freq: Three times a day (TID) | ORAL | 0 refills | Status: DC

## 2017-01-26 MED ORDER — DIAZEPAM 5 MG PO TABS
5.0000 mg | ORAL_TABLET | Freq: Once | ORAL | Status: AC
  Administered 2017-01-26: 5 mg via ORAL
  Filled 2017-01-26: qty 1

## 2017-01-26 MED ORDER — DEXAMETHASONE SODIUM PHOSPHATE 10 MG/ML IJ SOLN
10.0000 mg | Freq: Once | INTRAMUSCULAR | Status: AC
  Administered 2017-01-26: 10 mg via INTRAMUSCULAR
  Filled 2017-01-26: qty 1

## 2017-01-26 MED ORDER — PREDNISONE 20 MG PO TABS: 40.0000 mg | ORAL_TABLET | Freq: Every day | ORAL | 0 refills | Status: DC

## 2017-01-26 MED ORDER — DIAZEPAM 5 MG/ML IJ SOLN: 5.0000 mg | Freq: Once | INTRAMUSCULAR | Status: DC

## 2017-01-26 NOTE — ED Provider Notes (Signed)
[facility] DEPT Provider Note   CSN: [id number] Arrival date & time: 01/26/17  1547     History   Chief Complaint Chief Complaint  Patient presents with  . Back Pain    HPI Benjamin Carter is a 36 y.o. male.  HPI Benjamin Carter is a 36 y.o. male with hx of back pain, presents to ED with complaint of back pain. Pt states he has had 2 surgeries, over 10 years ago in [place] on his lumbar spine. States this pain started about 2 wks ago, worsened today. Reports right lower back pain, that is sharp,  that radiates into right hip. States any movement makes it worse. Nothing makes it better. Takes lyrica daily for fibromyalgia. Today took his old skelaxin which helped temporarily. Denies new numbness or weakness in extremities. No trouble urinating, normal bowel movements. No new injuries. States he does exercises for his back 3 times a week religiously. Has a PCP but has not seen them bc he does not "like doctors."   Past Medical History:  Diagnosis Date  . History of MRSA infection    left index finger 03-07-2015 w/ positive blood cultures  . Left hand pain   . Osteomyelitis of finger of left hand ([place])   . Wears glasses     Patient Active Problem List   Diagnosis Date Noted  . Anxiety 03/07/2014  . Neuropathy 03/07/2014  . Cellulitis of left index finger 03/07/2014  . Bacteremia due to Staphylococcus aureus 03/07/2014  . Hyponatremia 03/07/2014  . Pancreatitis, acute 03/07/2014  . Finger infection 03/07/2014  . Bacteremia 03/07/2014    Past Surgical History:  Procedure Laterality Date  . AMPUTATION Left 03/10/2014   Procedure: Irrigation and Debridement and Revision of Amputation Left Index Finger;  Surgeon: Linna Hoff, MD;  Location: [facility];  Service: Orthopedics;  Laterality: Left;  Wants to go at 5PM  . AMPUTATION Left 10/21/2014   Procedure: LEFT LONG FINGER AMPUTATION REVISION;  Surgeon: Iran Planas, MD;  Location: [facility];  Service: Orthopedics;  Laterality: Left;  .  CHOLECYSTECTOMY N/A 12/07/2013   Procedure: LAPAROSCOPIC CHOLECYSTECTOMY;  Surgeon: Jamesetta So, MD;  Location: [facility];  Service: General;  Laterality: N/A;  . I&D EXTREMITY Left 03/07/2014   Procedure: IRRIGATION AND DEBRIDEMENT INDEX FINGER;  Surgeon: Linna Hoff, MD;  Location: [facility];  Service: Orthopedics;  Laterality: Left;  . [place] SURGERY  2004       Home Medications    Prior to Admission medications   Medication Sig Start Date End Date Taking? Authorizing Provider  ALPRAZolam Duanne Moron) 1 MG tablet Take 1 mg by mouth 3 (three) times daily as needed. For anxiety 08/25/14   [provider]  meloxicam (MOBIC) 7.5 MG tablet Take 1 tablet (7.5 mg total) by mouth daily. 12/25/14   Clayton Bibles, PA-C  metaxalone (SKELAXIN) 800 MG tablet Take 1 tablet (800 mg total) by mouth 3 (three) times daily. 01/26/17   , Lahoma Rocker, PA-C  Multiple Vitamin (MULTIVITAMIN WITH MINERALS) TABS tablet Take 1 tablet by mouth daily.    [provider]  oxyCODONE-acetaminophen (PERCOCET) 10-325 MG per tablet Take 1 tablet by mouth every 4 (four) hours as needed for pain. 10/21/14   Iran Planas, MD  predniSONE (DELTASONE) 20 MG tablet Take 2 tablets (40 mg total) by mouth daily. 01/26/17   , Lahoma Rocker, PA-C  triamcinolone lotion (KENALOG) 0.1 % Apply 1 application topically 3 (three) times daily. 03/16/15   Fransico Meadow,  PA-C    Family History No family history on file.  Social History Social History  Substance Use Topics  . Smoking status: Current Every Day Smoker    Packs/day: 1.00    Years: 20.00    Types: Cigarettes  . Smokeless tobacco: Never Used  . Alcohol use Yes     Comment: social     Allergies   Bactrim [sulfamethoxazole-trimethoprim]; Doxycycline; Sulfites; and Toradol [ketorolac tromethamine]   Review of Systems Review of Systems  Constitutional: Negative for chills and fever.  Respiratory: Negative for cough, chest tightness and shortness of  breath.   Cardiovascular: Negative for chest pain, palpitations and leg swelling.  Gastrointestinal: Negative for abdominal distention, abdominal pain, diarrhea, nausea and vomiting.  Genitourinary: Negative for dysuria, frequency, hematuria and urgency.  Musculoskeletal: Positive for arthralgias and back pain. Negative for myalgias, neck pain and neck stiffness.  Skin: Negative for rash.  Allergic/Immunologic: Negative for immunocompromised state.  Neurological: Negative for dizziness, weakness, light-headedness, numbness and headaches.  All other systems reviewed and are negative.    Physical Exam Updated Vital Signs BP 119/89 (BP Location: Right Arm)   Pulse 78   Temp 98.2 F (36.8 C) (Oral)   Resp 17   Ht 6\' 6"  (1.981 m)   Wt 127 kg (280 lb)   SpO2 99%   BMI 32.36 kg/m   Physical Exam  Constitutional: He appears well-developed and well-nourished. No distress.  HENT:  Head: Normocephalic and atraumatic.  Eyes: Conjunctivae are normal.  Neck: Neck supple.  Cardiovascular: Normal rate, regular rhythm, normal heart sounds and intact distal pulses.   Pulmonary/Chest: Effort normal. No respiratory distress. He has no wheezes. He has no rales.  Abdominal: Soft. Bowel sounds are normal. He exhibits no distension. There is no tenderness. There is no rebound.  Musculoskeletal: He exhibits no edema.  ttp over midline lumbar spine and right SI joint and right perispinal muscles. Pain with right leg raise.   Neurological: He is alert.  5/5 and equal lower extremity strength. 2+ and equal patellar reflexes bilaterally. Pt able to dorsiflex bilateral toes and feet with good strength against resistance. Equal sensation bilaterally over thighs and lower legs.   Skin: Skin is warm and dry.  Nursing note and vitals reviewed.    ED Treatments / Results  Labs (all labs ordered are listed, but only abnormal results are displayed) Labs Reviewed - No data to display  EKG  EKG  Interpretation None       Radiology No results found.  Procedures Procedures (including critical care time)  Medications Ordered in ED Medications  dexamethasone (DECADRON) injection 10 mg (not administered)  diazepam (VALIUM) injection 5 mg (not administered)     Initial Impression / Assessment and Plan / ED Course  I have reviewed the triage vital signs and the nursing notes.  Pertinent labs & imaging results that were available during my care of the patient were reviewed by me and considered in my medical decision making (see chart for details).     Pt with acute on chronic back pain. Appears to be in a lot of pain. Told me he takes lyrica and today tool old skelaxin that he had. Exam with no concern for cauda equina, consistent with radicular lumbosacral pain. I looked up pt on database, pt received 40tab of percocet 10mg  just 4 days ago. He did not inform me of this. Will treat with steroids, decadron in ED, valium for spasms. Home with prednisone and skelaxin. Follow up  with pcp. Return precautions discussed.    Vitals:   01/26/17 1553  BP: 119/89  Pulse: 78  Resp: 17  Temp: 98.2 F (36.8 C)     Final Clinical Impressions(s) / ED Diagnoses   Final diagnoses:  Lumbosacral radiculopathy    New Prescriptions New Prescriptions   METAXALONE (SKELAXIN) 800 MG TABLET    Take 1 tablet (800 mg total) by mouth 3 (three) times daily.   PREDNISONE (DELTASONE) 20 MG TABLET    Take 2 tablets (40 mg total) by mouth daily.     Jeannett Senior, PA-C 01/26/17 1627    Jeannett Senior, PA-C 01/26/17 1640    Noemi Chapel, MD 01/26/17 [phone]

## 2017-01-26 NOTE — ED Notes (Signed)
Pt reports that he has been to several pain clinics but he did not like them  He is currently eating pretzels and appears in no distress

## 2017-01-26 NOTE — ED Triage Notes (Signed)
Right lower back pain that radiates down right leg x2 weeks. Denies recent injury. Patient is currently taking medications for chronic back pain with no relief.

## 2017-01-26 NOTE — Discharge Instructions (Signed)
Continue Oxycodone for your pain. Continue lyrica. Continue skelaxin for muscle spasms. Prednisone as prescribed until all gone for inflammation. After finish prednisone, take naprosyn for pain and inflammation twice a day. Exercises as instructed below daily. Stretch daily. Follow up with your family doctor for further evaluation.

## 2017-03-20 DIAGNOSIS — F3162 Bipolar disorder, current episode mixed, moderate: Secondary | ICD-10-CM | POA: Diagnosis not present

## 2017-06-27 DIAGNOSIS — S68611D Complete traumatic transphalangeal amputation of left index finger, subsequent encounter: Secondary | ICD-10-CM | POA: Diagnosis not present

## 2017-06-27 DIAGNOSIS — M86642 Other chronic osteomyelitis, left hand: Secondary | ICD-10-CM | POA: Diagnosis not present

## 2017-07-17 DIAGNOSIS — F3162 Bipolar disorder, current episode mixed, moderate: Secondary | ICD-10-CM | POA: Diagnosis not present

## 2017-08-05 DIAGNOSIS — M869 Osteomyelitis, unspecified: Secondary | ICD-10-CM | POA: Insufficient documentation

## 2017-09-30 DIAGNOSIS — F3162 Bipolar disorder, current episode mixed, moderate: Secondary | ICD-10-CM | POA: Diagnosis not present

## 2017-12-19 DIAGNOSIS — Q141 Congenital malformation of retina: Secondary | ICD-10-CM | POA: Diagnosis not present

## 2017-12-30 DIAGNOSIS — F3162 Bipolar disorder, current episode mixed, moderate: Secondary | ICD-10-CM | POA: Diagnosis not present

## 2018-02-12 DIAGNOSIS — M7712 Lateral epicondylitis, left elbow: Secondary | ICD-10-CM | POA: Diagnosis not present

## 2018-02-12 DIAGNOSIS — M868X4 Other osteomyelitis, hand: Secondary | ICD-10-CM | POA: Diagnosis not present

## 2018-05-07 DIAGNOSIS — F3162 Bipolar disorder, current episode mixed, moderate: Secondary | ICD-10-CM | POA: Diagnosis not present

## 2018-05-26 ENCOUNTER — Ambulatory Visit: Payer: Medicare Other | Admitting: Family Medicine

## 2018-06-16 ENCOUNTER — Ambulatory Visit: Payer: Medicare Other | Admitting: Family Medicine

## 2018-06-24 DIAGNOSIS — M7712 Lateral epicondylitis, left elbow: Secondary | ICD-10-CM | POA: Diagnosis not present

## 2018-06-24 DIAGNOSIS — M868X4 Other osteomyelitis, hand: Secondary | ICD-10-CM | POA: Diagnosis not present

## 2018-07-09 DIAGNOSIS — F3162 Bipolar disorder, current episode mixed, moderate: Secondary | ICD-10-CM | POA: Diagnosis not present

## 2018-07-10 ENCOUNTER — Encounter: Payer: Self-pay | Admitting: Family Medicine

## 2018-07-10 ENCOUNTER — Ambulatory Visit (INDEPENDENT_AMBULATORY_CARE_PROVIDER_SITE_OTHER): Payer: Medicare Other | Admitting: Family Medicine

## 2018-07-10 VITALS — BP 126/78 | HR 66 | Temp 97.3°F | Ht 78.0 in | Wt 313.6 lb

## 2018-07-10 DIAGNOSIS — I10 Essential (primary) hypertension: Secondary | ICD-10-CM | POA: Diagnosis not present

## 2018-07-10 DIAGNOSIS — F319 Bipolar disorder, unspecified: Secondary | ICD-10-CM | POA: Diagnosis not present

## 2018-07-10 MED ORDER — LISINOPRIL 10 MG PO TABS: 10.0000 mg | ORAL_TABLET | Freq: Every day | ORAL | 1 refills | Status: DC

## 2018-07-10 MED ORDER — CLONIDINE HCL 0.1 MG PO TABS: 0.1000 mg | ORAL_TABLET | Freq: Two times a day (BID) | ORAL | 1 refills | Status: DC

## 2018-07-10 MED ORDER — OXYCODONE-ACETAMINOPHEN 10-325 MG PO TABS: 1.0000 | ORAL_TABLET | ORAL | 0 refills | Status: DC | PRN

## 2018-07-10 NOTE — Progress Notes (Signed)
BP 126/78   Pulse 66   Temp (!) 97.3 F (36.3 C) (Oral)   Ht '6\' 6"'  (1.981 m)   Wt (!) 313 lb 9.6 oz (142.2 kg)   BMI 36.24 kg/m    Subjective:    Patient ID: Benjamin Carter, male    DOB: Feb 06, 1981, 37 y.o.   MRN: [id number]  HPI: Benjamin Carter is a 37 y.o. male presenting on 07/10/2018 for New Patient (Initial Visit) (seeing Dr.Conrad Daum in [place]- phsy) and Establish Care   HPI Hypertension Patient comes in for a new patient visit. Patient is currently on lisinopril and clonidine, and their blood pressure today is 126/78. Patient denies any lightheadedness or dizziness. Patient denies headaches, blurred vision, chest pains, shortness of breath, or weakness. Denies any side effects from medication and is content with current medication.   Bipolar disorder Patient comes in for bipolar disorder and he says that he sees Dr. Heloise Purpura in [place] as his psychiatrist and he plans to keep going to that psych.  Patient denies any suicidal ideations and says that his bipolar has been treated with counseling clinically. Depression screen PHQ 2/9 07/10/2018  Decreased Interest 0  Down, Depressed, Hopeless 0  PHQ - 2 Score 0     Relevant past medical, surgical, family and social history reviewed and updated as indicated. Interim medical history since our last visit reviewed. Allergies and medications reviewed and updated.  Review of Systems  Constitutional: Negative for chills and fever.  HENT: Negative for tinnitus.   Respiratory: Negative for cough, shortness of breath and wheezing.   Cardiovascular: Negative for chest pain, palpitations and leg swelling.  Gastrointestinal: Negative for abdominal pain, blood in stool, constipation and diarrhea.  Genitourinary: Negative for dysuria and hematuria.  Musculoskeletal: Negative for back pain and myalgias.  Skin: Negative for rash.  Neurological: Negative for dizziness, weakness and headaches.  Psychiatric/Behavioral: Negative for suicidal  ideas.    Per HPI unless specifically indicated above  Social History   Socioeconomic History  . Marital status: Single    Spouse name: Not on file  . Number of children: Not on file  . Years of education: Not on file  . Highest education level: Not on file  Occupational History  . Not on file  Social Needs  . Financial resource strain: Not on file  . Food insecurity:    Worry: Not on file    Inability: Not on file  . Transportation needs:    Medical: Not on file    Non-medical: Not on file  Tobacco Use  . Smoking status: Current Every Day Smoker    Packs/day: 1.00    Years: 20.00    Pack years: 20.00    Types: Cigarettes  . Smokeless tobacco: Never Used  Substance and Sexual Activity  . Alcohol use: Yes    Comment: social infrequently  . Drug use: Yes    Frequency: 4.0 times per week    Types: Marijuana  . Sexual activity: Yes    Birth control/protection: Surgical    Comment: girlfriend had surgery, 83year old girl  Lifestyle  . Physical activity:    Days per week: Not on file    Minutes per session: Not on file  . Stress: Not on file  Relationships  . Social connections:    Talks on phone: Not on file    Gets together: Not on file    Attends religious service: Not on file    Active member of club  or organization: Not on file    Attends meetings of clubs or organizations: Not on file    Relationship status: Not on file  . Intimate partner violence:    Fear of current or ex partner: Not on file    Emotionally abused: Not on file    Physically abused: Not on file    Forced sexual activity: Not on file  Other Topics Concern  . Not on file  Social History Narrative  . Not on file    Past Surgical History:  Procedure Laterality Date  . AMPUTATION Left 03/10/2014   Procedure: Irrigation and Debridement and Revision of Amputation Left Index Finger;  Surgeon: Linna Hoff, MD;  Location: [facility];  Service: Orthopedics;  Laterality: Left;  Wants to go at 5PM    . AMPUTATION Left 10/21/2014   Procedure: LEFT LONG FINGER AMPUTATION REVISION;  Surgeon: Iran Planas, MD;  Location: [facility];  Service: Orthopedics;  Laterality: Left;  . CHOLECYSTECTOMY N/A 12/07/2013   Procedure: LAPAROSCOPIC CHOLECYSTECTOMY;  Surgeon: Jamesetta So, MD;  Location: [facility];  Service: General;  Laterality: N/A;  . GALLBLADDER SURGERY  2015  . I&D EXTREMITY Left 03/07/2014   Procedure: IRRIGATION AND DEBRIDEMENT INDEX FINGER;  Surgeon: Linna Hoff, MD;  Location: [facility];  Service: Orthopedics;  Laterality: Left;  . [place] SURGERY  2004    Family History  Problem Relation Age of Onset  . Diabetes Mother   . Hyperlipidemia Mother   . Hypertension Mother   . Heart disease Mother   . Kidney failure Mother   . Neuropathy Mother   . Heart attack Father        20, 39 first MI  . Cancer Father        colon upper 63's  . Stroke Maternal Grandmother   . Cirrhosis Maternal Grandfather     Allergies as of 07/10/2018      Reactions   Bactrim [sulfamethoxazole-trimethoprim]    Caused pancreatitis   Doxycycline    Sulfites    Caused elevated liver enzymes   Toradol [ketorolac Tromethamine] Rash      Medication List       Accurate as of July 10, 2018 10:13 AM. Always use your most recent med list.        ALPRAZolam 1 MG tablet Commonly known as:  XANAX Take 1 mg by mouth 3 (three) times daily as needed. For anxiety   cloNIDine 0.1 MG tablet Commonly known as:  CATAPRES Take 1 tablet (0.1 mg total) by mouth 2 (two) times daily.   lisinopril 10 MG tablet Commonly known as:  PRINIVIL,ZESTRIL Take 1 tablet (10 mg total) by mouth daily.   multivitamin with minerals Tabs tablet Take 1 tablet by mouth daily.   oxyCODONE-acetaminophen 10-325 MG tablet Commonly known as:  PERCOCET Take 1 tablet by mouth every 4 (four) hours as needed for pain.          Objective:    BP 126/78   Pulse 66   Temp (!) 97.3 F (36.3 C) (Oral)   Ht '6\' 6"'  (1.981 m)    Wt (!) 313 lb 9.6 oz (142.2 kg)   BMI 36.24 kg/m   Wt Readings from Last 3 Encounters:  07/10/18 (!) 313 lb 9.6 oz (142.2 kg)  01/26/17 280 lb (127 kg)  03/16/15 260 lb (117.9 kg)    Physical Exam Vitals signs and nursing note reviewed.  Constitutional:      General: He is not  in acute distress.    Appearance: He is well-developed. He is not diaphoretic.  Eyes:     General: No scleral icterus.    Conjunctiva/sclera: Conjunctivae normal.  Neck:     Musculoskeletal: Neck supple.     Thyroid: No thyromegaly.  Cardiovascular:     Rate and Rhythm: Normal rate and regular rhythm.     Heart sounds: Normal heart sounds. No murmur.  Pulmonary:     Effort: Pulmonary effort is normal. No respiratory distress.     Breath sounds: Normal breath sounds. No wheezing.  Abdominal:     General: Abdomen is flat. There is no distension.     Palpations: Abdomen is soft.     Tenderness: There is no abdominal tenderness. There is no guarding.  Musculoskeletal: Normal range of motion.  Lymphadenopathy:     Cervical: No cervical adenopathy.  Skin:    General: Skin is warm and dry.     Findings: No rash.  Neurological:     Mental Status: He is alert and oriented to person, place, and time.     Coordination: Coordination normal.  Psychiatric:        Mood and Affect: Mood normal.        Behavior: Behavior normal.        Thought Content: Thought content normal.        Judgment: Judgment normal.         Assessment & Plan:   Problem List Items Addressed This Visit      Cardiovascular and Mediastinum   Hypertension - Primary   Relevant Medications   cloNIDine (CATAPRES) 0.1 MG tablet   lisinopril (PRINIVIL,ZESTRIL) 10 MG tablet   Other Relevant Orders   CMP14+EGFR   Lipid panel     Other   Bipolar 1 disorder (HCC)   Relevant Orders   CBC with Differential/Platelet      Patient sees psychiatry for bipolar and Dr. Heloise Purpura.  Patient sees his orthopedic Dr. Apolonio Schneiders for his  oxycodone  We will continue his clonidine and lisinopril as his blood pressure looks good today and will do some blood work today. Follow up plan: Return if symptoms worsen or fail to improve.  Caryl Pina, MD [facility] 07/10/2018, 10:13 AM

## 2018-07-11 LAB — LIPID PANEL
Chol/HDL Ratio: 3.5 ratio (ref 0.0–5.0)
Cholesterol, Total: 183 mg/dL (ref 100–199)
HDL: 53 mg/dL (ref >39)
LDL Calculated: 107 mg/dL — ABNORMAL HIGH (ref 0–99)
Triglycerides: 116 mg/dL (ref 0–149)
VLDL Cholesterol Cal: 23 mg/dL (ref 5–40)

## 2018-07-11 LAB — CMP14+EGFR
ALT: 23 IU/L (ref 0–44)
AST: 28 IU/L (ref 0–40)
Albumin/Globulin Ratio: 1.8 (ref 1.2–2.2)
Albumin: 4.9 g/dL (ref 3.5–5.5)
Alkaline Phosphatase: 70 IU/L (ref 39–117)
BILIRUBIN TOTAL: 0.8 mg/dL (ref 0.0–1.2)
BUN/Creatinine Ratio: 11 (ref 9–20)
BUN: 10 mg/dL (ref 6–20)
CALCIUM: 9.6 mg/dL (ref 8.7–10.2)
CHLORIDE: 101 mmol/L (ref 96–106)
CO2: 24 mmol/L (ref 20–29)
Creatinine, Ser: 0.92 mg/dL (ref 0.76–1.27)
GFR calc Af Amer: 122 mL/min/{1.73_m2} (ref >59)
GFR, EST NON AFRICAN AMERICAN: 106 mL/min/{1.73_m2} (ref >59)
GLUCOSE: 80 mg/dL (ref 65–99)
Globulin, Total: 2.7 g/dL (ref 1.5–4.5)
Potassium: 4.5 mmol/L (ref 3.5–5.2)
Sodium: 140 mmol/L (ref 134–144)
Total Protein: 7.6 g/dL (ref 6.0–8.5)

## 2018-07-11 LAB — CBC WITH DIFFERENTIAL/PLATELET
BASOS ABS: 0.1 10*3/uL (ref 0.0–0.2)
Basos: 1 %
EOS (ABSOLUTE): 0.2 10*3/uL (ref 0.0–0.4)
Eos: 2 %
Hematocrit: 49.4 % (ref 37.5–51.0)
Hemoglobin: 16.5 g/dL (ref 13.0–17.7)
IMMATURE GRANS (ABS): 0 10*3/uL (ref 0.0–0.1)
Immature Granulocytes: 0 %
LYMPHS: 23 %
Lymphocytes Absolute: 2.5 10*3/uL (ref 0.7–3.1)
MCH: 31.4 pg (ref 26.6–33.0)
MCHC: 33.4 g/dL (ref 31.5–35.7)
MCV: 94 fL (ref 79–97)
Monocytes Absolute: 0.9 10*3/uL (ref 0.1–0.9)
Monocytes: 8 %
Neutrophils Absolute: 7.3 10*3/uL — ABNORMAL HIGH (ref 1.4–7.0)
Neutrophils: 66 %
PLATELETS: 239 10*3/uL (ref 150–450)
RBC: 5.25 x10E6/uL (ref 4.14–5.80)
RDW: 12.9 % (ref 12.3–15.4)
WBC: 10.9 10*3/uL — AB (ref 3.4–10.8)

## 2018-08-07 ENCOUNTER — Other Ambulatory Visit: Payer: Self-pay

## 2018-08-07 ENCOUNTER — Telehealth: Payer: Self-pay | Admitting: Family Medicine

## 2018-08-07 NOTE — Telephone Encounter (Signed)
lmtcb

## 2018-08-07 NOTE — Telephone Encounter (Signed)
Sent to Dr D

## 2018-08-07 NOTE — Telephone Encounter (Signed)
Apt made

## 2018-08-07 NOTE — Telephone Encounter (Signed)
This has never been filled by our office, patient does have psychiatry, this is something that likely would either need to be filled by psychiatry or he would need to come in for a visit to discuss whether or not we need to continue this or not.

## 2018-08-07 NOTE — Telephone Encounter (Signed)
Last seen 12/19  Dr Dettinger

## 2018-08-08 DIAGNOSIS — S61431A Puncture wound without foreign body of right hand, initial encounter: Secondary | ICD-10-CM | POA: Diagnosis not present

## 2018-08-08 DIAGNOSIS — M7712 Lateral epicondylitis, left elbow: Secondary | ICD-10-CM | POA: Diagnosis not present

## 2018-08-08 DIAGNOSIS — M79641 Pain in right hand: Secondary | ICD-10-CM | POA: Diagnosis not present

## 2018-08-08 DIAGNOSIS — M868X4 Other osteomyelitis, hand: Secondary | ICD-10-CM | POA: Diagnosis not present

## 2018-08-14 ENCOUNTER — Encounter: Payer: Self-pay | Admitting: Family Medicine

## 2018-08-14 ENCOUNTER — Ambulatory Visit (INDEPENDENT_AMBULATORY_CARE_PROVIDER_SITE_OTHER): Payer: Medicare Other | Admitting: Family Medicine

## 2018-08-14 VITALS — BP 123/77 | HR 69 | Temp 97.1°F | Ht 78.0 in | Wt 311.8 lb

## 2018-08-14 DIAGNOSIS — F419 Anxiety disorder, unspecified: Secondary | ICD-10-CM | POA: Diagnosis not present

## 2018-08-14 DIAGNOSIS — F319 Bipolar disorder, unspecified: Secondary | ICD-10-CM

## 2018-08-14 MED ORDER — ALPRAZOLAM 1 MG PO TABS: 1.0000 mg | ORAL_TABLET | Freq: Three times a day (TID) | ORAL | 2 refills | Status: DC | PRN

## 2018-08-14 NOTE — Progress Notes (Signed)
BP 123/77   Pulse 69   Temp (!) 97.1 F (36.2 C) (Oral)   Ht 6\' 6"  (1.981 m)   Wt (!) 311 lb 12.8 oz (141.4 kg)   BMI 36.03 kg/m    Subjective:    Patient ID: Benjamin Carter, male    DOB: [birth year], 38 y.o.   MRN: [id number]  HPI: Benjamin Carter is a 38 y.o. male presenting on 08/14/2018 for med discussion (xanax- Patient states that the mental health he was seeing in [place] can no longer perscribe it)   HPI Anxiety and bipolar Patient is coming in to discuss bipolar and anxiety.  He currently has a psychiatrist in [place] but since they moved here and his insurance is changed over he thinks that is no longer going to be a possibility he is coming to establish care with our office but there was some misunderstanding as to whether his psychiatrist was still going to prescribe his psychiatric medications.  He has been seeing his psychiatrist for quite many years.  He says he is looking for a psychiatrist in the area but has not had a chance to call and up and get one yet.  He is coming in today to see if he can get a prescription for the the Xanax which is all he is using currently for his psychiatric medication wise as he is trying to find a new psychiatrist.  Patient denies any suicidal ideations or thoughts of hurting himself. Depression screen [facility] 2/9 08/14/2018 07/10/2018  Decreased Interest 1 0  Down, Depressed, Hopeless 0 0  PHQ - 2 Score 1 0    Relevant past medical, surgical, family and social history reviewed and updated as indicated. Interim medical history since our last visit reviewed. Allergies and medications reviewed and updated.  Review of Systems  Constitutional: Negative for chills and fever.  Eyes: Negative for visual disturbance.  Respiratory: Negative for shortness of breath and wheezing.   Cardiovascular: Negative for chest pain and leg swelling.  Musculoskeletal: Negative for back pain and gait problem.  Skin: Negative for rash.  Psychiatric/Behavioral:  Positive for behavioral problems, decreased concentration and dysphoric mood. Negative for self-injury, sleep disturbance and suicidal ideas. The patient is nervous/anxious.   All other systems reviewed and are negative.   Per HPI unless specifically indicated above   Allergies as of 08/14/2018      Reactions   Bactrim [sulfamethoxazole-trimethoprim]    Caused pancreatitis   Doxycycline    Sulfites    Caused elevated liver enzymes   Toradol [ketorolac Tromethamine] Rash      Medication List       Accurate as of August 14, 2018 11:59 PM. Always use your most recent med list.        ALPRAZolam 1 MG tablet Commonly known as:  XANAX Take 1 tablet (1 mg total) by mouth 3 (three) times daily as needed. For anxiety   cloNIDine 0.1 MG tablet Commonly known as:  CATAPRES Take 1 tablet (0.1 mg total) by mouth 2 (two) times daily.   lisinopril 10 MG tablet Commonly known as:  PRINIVIL,ZESTRIL Take 1 tablet (10 mg total) by mouth daily.   multivitamin with minerals Tabs tablet Take 1 tablet by mouth daily.   oxyCODONE-acetaminophen 10-325 MG tablet Commonly known as:  PERCOCET Take 1 tablet by mouth every 4 (four) hours as needed for pain.          Objective:    BP 123/77   Pulse 69  Temp (!) 97.1 F (36.2 C) (Oral)   Ht 6\' 6"  (1.981 m)   Wt (!) 311 lb 12.8 oz (141.4 kg)   BMI 36.03 kg/m   Wt Readings from Last 3 Encounters:  08/14/18 (!) 311 lb 12.8 oz (141.4 kg)  07/10/18 (!) 313 lb 9.6 oz (142.2 kg)  01/26/17 280 lb (127 kg)    Physical Exam Vitals signs and nursing note reviewed.  Constitutional:      General: He is not in acute distress.    Appearance: He is well-developed. He is not diaphoretic.  Eyes:     General: No scleral icterus.    Conjunctiva/sclera: Conjunctivae normal.  Neck:     Thyroid: No thyromegaly.  Cardiovascular:     Rate and Rhythm: Normal rate and regular rhythm.     Heart sounds: Normal heart sounds. No murmur.  Pulmonary:      Effort: Pulmonary effort is normal. No respiratory distress.     Breath sounds: Normal breath sounds. No wheezing.  Skin:    General: Skin is warm and dry.     Findings: No rash.  Neurological:     Mental Status: He is alert and oriented to person, place, and time.     Coordination: Coordination normal.  Psychiatric:        Mood and Affect: Mood is anxious and depressed.        Behavior: Behavior normal.        Thought Content: Thought content does not include suicidal ideation. Thought content does not include suicidal plan.         Assessment & Plan:   Problem List Items Addressed This Visit      Other   Anxiety   Relevant Medications   ALPRAZolam (XANAX) 1 MG tablet   Bipolar 1 disorder ([place]) - Primary   Relevant Medications   ALPRAZolam (XANAX) 1 MG tablet      This will be a 24-month prescription and then if he cannot get into them by 3 months but I will give him 1 more month in the future and this was discussed extensively with the patient that he needs to find a psychiatrist to treat his bipolar Follow up plan: Return if symptoms worsen or fail to improve.  Counseling provided for all of the vaccine components No orders of the defined types were placed in this encounter.   Caryl Pina, MD [facility] 08/19/2018, 9:56 PM

## 2018-10-21 DIAGNOSIS — F3162 Bipolar disorder, current episode mixed, moderate: Secondary | ICD-10-CM | POA: Diagnosis not present

## 2018-11-04 ENCOUNTER — Telehealth: Payer: Self-pay | Admitting: Family Medicine

## 2018-11-04 DIAGNOSIS — F319 Bipolar disorder, unspecified: Secondary | ICD-10-CM

## 2018-11-04 DIAGNOSIS — F419 Anxiety disorder, unspecified: Secondary | ICD-10-CM

## 2018-11-04 NOTE — Telephone Encounter (Signed)
PT is needing a refill on ALPRAZolam Duanne Moron) 1 MG tablet He was told by his psychiatrist that he needed to contact his PCP for the refill that he would need to refill it. Pt is aware that he may have to have telephone visit in order for it be refilled.   Pharmacy [facility] [phone] - [place], [place]

## 2018-11-05 ENCOUNTER — Telehealth: Payer: Self-pay | Admitting: Family Medicine

## 2018-11-05 NOTE — Telephone Encounter (Signed)
Advised patient he would need to have a telephone visit with his PCP. Dettinger is not in office until Friday and patient states he only has one more Xanax left.  Patient is wanting to know if covering provider would do a phone visit with him and fill his xanax? Please advise

## 2018-11-05 NOTE — Telephone Encounter (Signed)
Patient aware - states that his psyiatrist from [facility] in [place] states they will not fill his Xanax since he has a PCP. They are wanting PCP to fill it.  Patient aware this will need to wait until Dr. Warrick Parisian returns to the office on Friday- patient verbalizes understanding.  Please advise

## 2018-11-05 NOTE — Telephone Encounter (Signed)
Refer to previous phone call  

## 2018-11-05 NOTE — Telephone Encounter (Signed)
Per Dr. Merita Norton note he was only giving patient this medication with only one refill and he was to follow up with psychiatrist . I can not refill this, I am sorry.

## 2018-11-05 NOTE — Telephone Encounter (Signed)
lmtcb

## 2018-11-07 MED ORDER — ALPRAZOLAM 1 MG PO TABS: 1.0000 mg | ORAL_TABLET | Freq: Three times a day (TID) | ORAL | 0 refills | Status: DC | PRN

## 2018-11-07 NOTE — Telephone Encounter (Signed)
I will give him the 1 more month as I had promised and not any further as I discussed 3 months ago that was given 3 months and possibly 1 more month if needed but he needs to find another psychiatrist who can take over his care and will prescribe medications because I will not prescribe this after this 1 month again.  He has bipolar and I do not treat bipolar 2 frequently and especially do not treat with this medication so that is why he needs a psychiatrist Caryl Pina, MD [facility] 11/07/2018, 12:51 PM

## 2018-11-07 NOTE — Addendum Note (Signed)
Addended by: Caryl Pina on: 11/07/2018 12:51 PM   Modules accepted: Orders

## 2018-11-07 NOTE — Telephone Encounter (Signed)
Phone visit scheduled for Monday.

## 2018-11-10 ENCOUNTER — Other Ambulatory Visit: Payer: Self-pay

## 2018-11-10 ENCOUNTER — Ambulatory Visit (INDEPENDENT_AMBULATORY_CARE_PROVIDER_SITE_OTHER): Payer: Medicare Other | Admitting: Family Medicine

## 2018-11-10 NOTE — Progress Notes (Signed)
Attempted to contact patient twice and unable to, disregard visit

## 2018-11-26 ENCOUNTER — Ambulatory Visit (INDEPENDENT_AMBULATORY_CARE_PROVIDER_SITE_OTHER): Payer: Medicare Other | Admitting: Family Medicine

## 2018-11-26 ENCOUNTER — Other Ambulatory Visit: Payer: Self-pay

## 2018-11-26 ENCOUNTER — Encounter: Payer: Self-pay | Admitting: Family Medicine

## 2018-11-26 DIAGNOSIS — F419 Anxiety disorder, unspecified: Secondary | ICD-10-CM | POA: Diagnosis not present

## 2018-11-26 DIAGNOSIS — F319 Bipolar disorder, unspecified: Secondary | ICD-10-CM | POA: Diagnosis not present

## 2018-11-26 MED ORDER — ALPRAZOLAM 1 MG PO TABS: 1.0000 mg | ORAL_TABLET | Freq: Three times a day (TID) | ORAL | 0 refills | Status: DC | PRN

## 2018-11-26 NOTE — Progress Notes (Signed)
Virtual Visit via telephone Note  I connected with Benjamin Carter on 11/26/18 at 1108 by telephone and verified that I am speaking with the correct person using two identifiers. Benjamin Carter is currently located at car and no other people are currently with her during visit. The provider, Fransisca Kaufmann , MD is located in their office at time of visit.  Call ended at 1116  I discussed the limitations, risks, security and privacy concerns of performing an evaluation and management service by telephone and the availability of in person appointments. I also discussed with the patient that there may be a patient responsible charge related to this service. The patient expressed understanding and agreed to proceed.   History and Present Illness: Patient is calling in today about his anxiety and is wondering if he can get another refill for xanax because he has not been able to be psych and is still trying and would like one more month of medications.  Denies suicidal ideation.  No diagnosis found.  Outpatient Encounter Medications as of 11/26/2018  Medication Sig  . ALPRAZolam (XANAX) 1 MG tablet Take 1 tablet (1 mg total) by mouth 3 (three) times daily as needed. For anxiety  . cloNIDine (CATAPRES) 0.1 MG tablet Take 1 tablet (0.1 mg total) by mouth 2 (two) times daily.  Marland Kitchen lisinopril (PRINIVIL,ZESTRIL) 10 MG tablet Take 1 tablet (10 mg total) by mouth daily.  . Multiple Vitamin (MULTIVITAMIN WITH MINERALS) TABS tablet Take 1 tablet by mouth daily.  Marland Kitchen oxyCODONE-acetaminophen (PERCOCET) 10-325 MG tablet Take 1 tablet by mouth every 4 (four) hours as needed for pain.   No facility-administered encounter medications on file as of 11/26/2018.     Review of Systems  Constitutional: Negative for chills and fever.  Respiratory: Negative for shortness of breath and wheezing.   Cardiovascular: Negative for chest pain and leg swelling.  Musculoskeletal: Negative for back pain and gait problem.   Skin: Negative for rash.  Psychiatric/Behavioral: Positive for dysphoric mood and sleep disturbance. Negative for behavioral problems, confusion, decreased concentration, hallucinations, self-injury and suicidal ideas. The patient is nervous/anxious. The patient is not hyperactive.   All other systems reviewed and are negative.   Observations/Objective: Patient sounds comfortable on the phone and in no acute distress  Assessment and Plan: Problem List Items Addressed This Visit      Other   Anxiety   Relevant Medications   ALPRAZolam (XANAX) 1 MG tablet   Other Relevant Orders   Ambulatory referral to Psychiatry   Bipolar 1 disorder ([place])   Relevant Medications   ALPRAZolam (XANAX) 1 MG tablet   Other Relevant Orders   Ambulatory referral to Psychiatry       Follow Up Instructions: Follow-up with psychiatry from here on out, will do 1 more month    I discussed the assessment and treatment plan with the patient. The patient was provided an opportunity to ask questions and all were answered. The patient agreed with the plan and demonstrated an understanding of the instructions.   The patient was advised to call back or seek an in-person evaluation if the symptoms worsen or if the condition fails to improve as anticipated.  The above assessment and management plan was discussed with the patient. The patient verbalized understanding of and has agreed to the management plan. Patient is aware to call the clinic if symptoms persist or worsen. Patient is aware when to return to the clinic for a follow-up visit. Patient educated on when  it is appropriate to go to the emergency department.    I provided 8 minutes of non-face-to-face time during this encounter.    Benjamin Rancher, MD

## 2018-12-03 ENCOUNTER — Other Ambulatory Visit: Payer: Self-pay | Admitting: *Deleted

## 2018-12-23 DIAGNOSIS — F3162 Bipolar disorder, current episode mixed, moderate: Secondary | ICD-10-CM | POA: Diagnosis not present

## 2019-01-08 ENCOUNTER — Telehealth: Payer: Self-pay | Admitting: Family Medicine

## 2019-01-08 ENCOUNTER — Other Ambulatory Visit: Payer: Self-pay

## 2019-01-08 NOTE — Telephone Encounter (Signed)
Left message stating that appt for 6/19 with Dr. Warrick Parisian has been changed to a televisit due to patient symptoms.

## 2019-01-08 NOTE — Telephone Encounter (Signed)
Please have him do a phone visit, tell him we are cautious and protecting others

## 2019-01-08 NOTE — Telephone Encounter (Signed)
Called to go over screening with patient answered yes to one or more screening questions. Pt states he has a cough every day in the mornings and at night. Also states has SOB when he works and panic attack . Pt states not related to COVID gave to clinical to review and call patient.

## 2019-01-09 ENCOUNTER — Ambulatory Visit: Payer: Medicare Other | Admitting: Family Medicine

## 2019-01-09 NOTE — Progress Notes (Signed)
Called patient 3 different times over the morning and did not answer

## 2019-02-05 ENCOUNTER — Ambulatory Visit (INDEPENDENT_AMBULATORY_CARE_PROVIDER_SITE_OTHER): Payer: Medicare Other | Admitting: *Deleted

## 2019-02-05 DIAGNOSIS — Z0000 Encounter for general adult medical examination without abnormal findings: Secondary | ICD-10-CM | POA: Diagnosis not present

## 2019-02-05 NOTE — Progress Notes (Addendum)
MEDICARE ANNUAL WELLNESS VISIT  02/05/2019  Telephone Visit Disclaimer This Medicare AWV was conducted by telephone due to national recommendations for restrictions regarding the COVID-19 Pandemic (e.g. social distancing).  I verified, using two identifiers, that I am speaking with Benjamin Carter or their authorized healthcare agent. I discussed the limitations, risks, security, and privacy concerns of performing an evaluation and management service by telephone and the potential availability of an in-person appointment in the future. The patient expressed understanding and agreed to proceed.   Subjective:  Benjamin Carter is a 38 y.o. male patient of Dettinger, Fransisca Kaufmann, MD who had a Medicare Annual Wellness Visit today via telephone. Benjamin Carter is Working full time and lives with their partner. He has 1 child. He reports that He is socially active and does interact with friends/family regularly. He is moderately physically active and enjoys fishing, hunting and spending time with family.  Patient Care Team: Dettinger, Fransisca Kaufmann, MD as PCP - General (Family Medicine) System, [place] Not In System, Provider Not In  Advanced Directives 02/05/2019 01/26/2017 03/16/2015 12/27/2014 12/25/2014 12/17/2014 10/21/2014  Does Patient Have a Medical Advance Directive? No No No Yes No No Yes  Type of Advance Directive - - - - - - Living will  Does patient want to make changes to medical advance directive? - - - - - - No - Patient declined  Copy of [place] in Chart? - - - - - - No - copy requested  Would patient like information on creating a medical advance directive? No - Patient declined No - Patient declined No - patient declined information No - patient declined information No - patient declined information No - patient declined information -  Pre-existing out of facility DNR order (yellow form or pink MOST form) - - - - - - -    Hospital Utilization Over the Past 12 Months: # of  hospitalizations or ER visits: 0 # of surgeries: 0  Review of Systems    Patient reports that his overall health is unchanged compared to last year.  Patient Reported Readings (BP, Pulse, CBG, Weight, etc) none  Review of Systems: History obtained from chart review and the patient  All other systems negative.  Pain Assessment       Current Medications & Allergies (verified) Allergies as of 02/05/2019      Reactions   Bactrim [sulfamethoxazole-trimethoprim]    Caused pancreatitis   Doxycycline    Sulfites    Caused elevated liver enzymes   Toradol [ketorolac Tromethamine] Rash      Medication List       Accurate as of February 05, 2019 10:04 AM. If you have any questions, ask your nurse or doctor.        ALPRAZolam 1 MG tablet Commonly known as: XANAX Take 1 tablet (1 mg total) by mouth 3 (three) times daily as needed. For anxiety   cloNIDine 0.1 MG tablet Commonly known as: CATAPRES Take 1 tablet (0.1 mg total) by mouth 2 (two) times daily.   lisinopril 10 MG tablet Commonly known as: ZESTRIL Take 1 tablet (10 mg total) by mouth daily.   multivitamin with minerals Tabs tablet Take 1 tablet by mouth daily.   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)    Wears glasses    Past Surgical History:  Procedure Laterality Date   AMPUTATION Left 03/10/2014   Procedure: Irrigation and Debridement and Revision of Amputation Left Index Finger;  Surgeon: Linna Hoff, MD;  Location: [facility];  Service: Orthopedics;  Laterality: Left;  Wants to go at [facility] Left 10/21/2014   Procedure: LEFT LONG FINGER AMPUTATION REVISION;  Surgeon: Iran Planas, MD;  Location: [facility];  Service: Orthopedics;  Laterality: Left;   CHOLECYSTECTOMY N/A 12/07/2013   Procedure: LAPAROSCOPIC CHOLECYSTECTOMY;  Surgeon: Jamesetta So, MD;  Location: [facility];  Service: General;  Laterality: N/A;   GALLBLADDER SURGERY  2015   I&D EXTREMITY Left 03/07/2014   Procedure: IRRIGATION AND DEBRIDEMENT INDEX FINGER;  Surgeon: Linna Hoff, MD;  Location: [facility];  Service: Orthopedics;  Laterality: Left;   [place] SURGERY  2004   Family History  Problem Relation Age of Onset   Diabetes Mother    Hyperlipidemia Mother    Hypertension Mother    Heart disease Mother    Kidney failure Mother    Neuropathy Mother    Heart attack Father        42, 64 first MI   Cancer Father        colon upper 43's   Stroke Maternal Grandmother    Cirrhosis Maternal Grandfather    Social History   Socioeconomic History   Marital status: Single    Spouse name: Not on file   Number of children: 1   Years of education: Not on file   Highest education level: High school graduate  Occupational History   Not on file  Social Needs   Financial resource strain: Not very hard   Food insecurity    Worry: Never true    Inability: Never true   Transportation needs    Medical: No    Non-medical: No  Tobacco Use   Smoking status: Current Every Day Smoker    Packs/day: 1.00    Years: 20.00    Pack years: 20.00    Types: Cigarettes   Smokeless tobacco: Never Used  Substance and Sexual Activity   Alcohol use: Yes    Comment: social infrequently   Drug use: Yes    Frequency: 4.0 times per week    Types: Marijuana   Sexual activity: Yes    Birth control/protection: Surgical    Comment: girlfriend had surgery, 23year old girl  Lifestyle   Physical activity    Days per week: 7 days    Minutes per session: 30 min   Stress: Rather much    Relationships   Social connections    Talks on phone: Never    Gets together: Not on file    Attends religious service: Never    Active member of club or organization: No    Attends meetings of clubs or organizations: Never    Relationship status: Living with partner  Other Topics Concern   Not on file  Social History Narrative   Not on file    Activities of Daily Living In your present state of health, do you have any difficulty performing the following activities: 02/05/2019  Hearing? Benjamin Carter  Comment patient states he has trouble hearing  Vision? N  Comment wears glasses  Difficulty concentrating or making decisions? N  Walking or climbing stairs? N  Dressing or bathing? N  Doing errands, shopping? N  Preparing Food and eating ? N  Using the Toilet? N  In the past six months, have you accidently leaked urine? N  Do you have problems with loss of bowel control? N  Managing your Medications? N  Managing your Finances? N  Housekeeping or managing your Housekeeping? N  Some recent data might be hidden    Patient Literacy    Exercise Current Exercise Habits: Home exercise routine, Type of exercise: walking, Time (Minutes): 30, Frequency (Times/Week): 7, Weekly Exercise (Minutes/Week): 210, Intensity: Mild, Exercise limited by: None identified  Diet Patient reports consuming 1 meals a day and 1 snack(s) a day Patient reports that his primary diet is: Regular Patient reports that she does have regular access to food.   Depression Screen PHQ 2/9 Scores 02/05/2019 08/14/2018 07/10/2018  PHQ - 2 Score 2 1 0  PHQ- 9 Score 7 - -     Fall Risk Fall Risk  02/05/2019 08/14/2018  Falls in the past year? 0 0     Objective:  Benjamin Carter seemed alert and oriented and he participated appropriately during our telephone visit.  Blood Pressure Weight BMI  BP Readings from Last 3 Encounters:  08/14/18 123/77  07/10/18 126/78  01/26/17 119/89   Wt Readings from Last 3  Encounters:  08/14/18 (!) 311 lb 12.8 oz (141.4 kg)  07/10/18 (!) 313 lb 9.6 oz (142.2 kg)  01/26/17 280 lb (127 kg)   BMI Readings from Last 1 Encounters:  08/14/18 36.03 kg/m    *Unable to obtain current vital signs, weight, and BMI due to telephone visit type  Hearing/Vision   Benjamin Carter did not seem to have difficulty with hearing/understanding during the telephone conversation  Reports that he has not had a formal eye exam by an eye care professional within the past year  Reports that he has not had a formal hearing evaluation within the past year *Unable to fully assess hearing and vision during telephone visit type  Cognitive Function: 6CIT Screen 02/05/2019  What Year? 0 points  What month? 0 points  What time? 0 points  Count back from 20 0 points  Months in reverse 0 points  Repeat phrase 0 points  Total Score 0   (Normal:0-7, Significant for Dysfunction: >8)  Normal Cognitive Function Screening: Yes   Immunization & Health Maintenance Record  There is no immunization history on file for this patient.  Health Maintenance  Topic Date Due   HIV Screening  09/10/1995   TETANUS/TDAP  09/10/1999   INFLUENZA VACCINE  02/21/2019       Assessment  This is a routine wellness examination for Benjamin Carter.  Health Maintenance: Due or Overdue Health Maintenance Due  Topic Date Due   HIV Screening  09/10/1995   TETANUS/TDAP  09/10/1999    Benjamin Carter does not need a referral for Community Assistance: Care Management:   no Social Work:    no Prescription Assistance:  no Nutrition/Diabetes Education:  no   Plan:  Personalized Goals Goals Addressed            This Visit's Progress    Medicare Wellness       02/05/2019 AWV Goal: Tobacco Cessation  Smoking cessation instruction/counseling given:  counseled patient on the dangers of tobacco use,  advised patient to stop smoking, and reviewed strategies to maximize success   Patient will  verbalize understanding of the health risks associated with smoking/tobacco use  Lung cancer or lung disease, such as COPD  Heart disease.  Stroke.  Heart attack  Infertility  Osteoporosis and bone fractures.  Patient will create a plan to quit smoking/using tobacco  Pick a date to quit.   Write down the reasons why you are quitting and put it where you will see it often.  Identify the people, places, things, and activities that make you want to smoke (triggers) and avoid them. Make sure to take these actions: ? Throw away all cigarettes at home, at work, and in your car. ? Throw away smoking accessories, such as Scientist, research (medical). ? Clean your car and make sure to empty the ashtray. ? Clean your home, including curtains and carpets.  Tell your family, friends, and coworkers that you are quitting. Support from your loved ones can make quitting easier.  Talk with your health care provider about your options for quitting smoking.  Find out what treatment options are covered by your health insurance.  Patient will be able to demonstrate knowledge of tobacco cessation strategies that may maximize success  Quitting cold [place] is more successful than gradually quitting.  Attending in-person counseling to help you build problem-solving skills.   Finding resources and support systems that can help you to quit smoking and remain smoke-free after you quit. These resources are most helpful when you use them often. They can include: ? Online chats with a Social worker. ? Telephone quitlines. ? Careers information officer. ? Support groups or group counseling. ? Text messaging programs. ? Mobile phone applications.  Taking medicines to help you quit smoking: ? Nicotine patches, gum, or lozenges. ? Nicotine inhalers or sprays. ? Non-nicotine medicine that is taken by mouth.  Patient will note get discouraged if the process is difficult  Over the next year, patient will stop  smoking or using other forms of tobacco  Smoking cessation instruction/counseling given:  counseled patient on the dangers of tobacco use, advised patient to stop smoking, and reviewed strategies to maximize success        Personalized Health Maintenance & Screening Recommendations  Td vaccine Smoking cessation counseling  Lung Cancer Screening Recommended: no (Low Dose CT Chest recommended if Age 26-80 years, 30 pack-year currently smoking OR have quit w/in past 15 years) Hepatitis C Screening recommended: no HIV Screening recommended: yes  Advanced Directives: Written information was not prepared per patient's request.  Referrals & Orders No orders of the defined types were placed in this encounter.   Follow-up Plan  Follow-up with Dettinger, Fransisca Kaufmann, MD as planned  Schedule eye exam  Discuss possible hearing screening with Dr. Warrick Parisian at next visit  Check the cost of Tdap at next appointment   I have personally reviewed and noted the following in the patients chart:    Medical and social history  Use of alcohol, tobacco or illicit drugs   Current medications and supplements  Functional ability and status  Nutritional status  Physical activity  Advanced directives  List of other physicians  Hospitalizations, surgeries, and ER visits in previous 12 months  Vitals  Screenings to include cognitive, depression, and falls  Referrals and appointments  In addition, I have reviewed and discussed with Benjamin Carter certain preventive protocols, quality metrics, and best practice recommendations. A written personalized care plan for preventive services as well as general preventive  health recommendations is available and can be mailed to the patient at his request.      Gareth Morgan, LPN  3/79/4327   I have reviewed and agree with the above AWV documentation.   Evelina Dun, FNP

## 2019-02-05 NOTE — Patient Instructions (Signed)
  [name] , Thank you for taking time to come for your Medicare Wellness Visit. I appreciate your ongoing commitment to your health goals. Please review the following plan we discussed and let me know if I can assist you in the future.   These are the goals we discussed: Goals    . Medicare Wellness     02/05/2019 AWV Goal: Tobacco Cessation  Smoking cessation instruction/counseling given:  counseled patient on the dangers of tobacco use, advised patient to stop smoking, and reviewed strategies to maximize success   Patient will verbalize understanding of the health risks associated with smoking/tobacco use  Lung cancer or lung disease, such as COPD  Heart disease.  Stroke.  Heart attack  Infertility  Osteoporosis and bone fractures. . Patient will create a plan to quit smoking/using tobacco  Pick a date to quit.   Write down the reasons why you are quitting and put it where you will see it often.  Identify the people, places, things, and activities that make you want to smoke (triggers) and avoid them. Make sure to take these actions: ? Throw away all cigarettes at home, at work, and in your car. ? Throw away smoking accessories, such as Scientist, research (medical). ? Clean your car and make sure to empty the ashtray. ? Clean your home, including curtains and carpets.  Tell your family, friends, and coworkers that you are quitting. Support from your loved ones can make quitting easier.  Talk with your health care provider about your options for quitting smoking.  Find out what treatment options are covered by your health insurance. . Patient will be able to demonstrate knowledge of tobacco cessation strategies that may maximize success  Quitting "cold [place]" is more successful than gradually quitting.  Attending in-person counseling to help you build problem-solving skills.   Finding resources and support systems that can help you to quit smoking and remain smoke-free  after you quit. These resources are most helpful when you use them often. They can include: ? Online chats with a Social worker. ? Telephone quitlines. ? Careers information officer. ? Support groups or group counseling. ? Text messaging programs. ? Mobile phone applications.  Taking medicines to help you quit smoking: ? Nicotine patches, gum, or lozenges. ? Nicotine inhalers or sprays. ? Non-nicotine medicine that is taken by mouth. . Patient will note get discouraged if the process is difficult . Over the next year, patient will stop smoking or using other forms of tobacco  Smoking cessation instruction/counseling given:  counseled patient on the dangers of tobacco use, advised patient to stop smoking, and reviewed strategies to maximize success         This is a list of the screening recommended for you and due dates:  Health Maintenance  Topic Date Due  . HIV Screening  09/10/1995  . Tetanus Vaccine  09/10/1999  . Flu Shot  02/21/2019

## 2019-02-16 ENCOUNTER — Encounter: Payer: Self-pay | Admitting: Psychiatry

## 2019-02-16 ENCOUNTER — Other Ambulatory Visit: Payer: Self-pay

## 2019-02-16 ENCOUNTER — Ambulatory Visit (INDEPENDENT_AMBULATORY_CARE_PROVIDER_SITE_OTHER): Payer: Medicare Other | Admitting: Psychiatry

## 2019-02-16 DIAGNOSIS — F1220 Cannabis dependence, uncomplicated: Secondary | ICD-10-CM

## 2019-02-16 DIAGNOSIS — F419 Anxiety disorder, unspecified: Secondary | ICD-10-CM | POA: Insufficient documentation

## 2019-02-16 DIAGNOSIS — F17200 Nicotine dependence, unspecified, uncomplicated: Secondary | ICD-10-CM

## 2019-02-16 DIAGNOSIS — F5105 Insomnia due to other mental disorder: Secondary | ICD-10-CM | POA: Insufficient documentation

## 2019-02-16 DIAGNOSIS — F3160 Bipolar disorder, current episode mixed, unspecified: Secondary | ICD-10-CM | POA: Insufficient documentation

## 2019-02-16 MED ORDER — HYDROXYZINE PAMOATE 25 MG PO CAPS: 25.0000 mg | ORAL_CAPSULE | Freq: Every day | ORAL | 1 refills | Status: DC | PRN

## 2019-02-16 MED ORDER — MIRTAZAPINE 15 MG PO TABS: 7.5000 mg | ORAL_TABLET | Freq: Every day | ORAL | 1 refills | Status: DC
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
## 2019-02-16 NOTE — Patient Instructions (Signed)
Hydroxyzine capsules or tablets What is this medicine? HYDROXYZINE (hye [place] i zeen) is an antihistamine. This medicine is used to treat allergy symptoms. It is also used to treat anxiety and tension. This medicine can be used with other medicines to induce sleep before surgery. This medicine may be used for other purposes; ask your health care provider or pharmacist if you have questions. COMMON BRAND NAME(S): ANX, Atarax, Rezine, Vistaril What should I tell my health care provider before I take this medicine? They need to know if you have any of these conditions:  glaucoma  heart disease  history of irregular heartbeat  kidney disease  liver disease  lung or breathing disease, like asthma  stomach or intestine problems  thyroid disease  trouble passing urine  an unusual or allergic reaction to hydroxyzine, cetirizine, other medicines, foods, dyes or preservatives  pregnant or trying to get pregnant  breast-feeding How should I use this medicine? Take this medicine by mouth with a full glass of water. Follow the directions on the prescription label. You may take this medicine with food or on an empty stomach. Take your medicine at regular intervals. Do not take your medicine more often than directed. Talk to your pediatrician regarding the use of this medicine in children. Special care may be needed. While this drug may be prescribed for children as young as 73 years of age for selected conditions, precautions do apply. Patients over 36 years old may have a stronger reaction and need a smaller dose. Overdosage: If you think you have taken too much of this medicine contact a poison control center or emergency room at once. NOTE: This medicine is only for you. Do not share this medicine with others. What if I miss a dose? If you miss a dose, take it as soon as you can. If it is almost time for your next dose, take only that dose. Do not take double or extra doses. What may  interact with this medicine? Do not take this medicine with any of the following medications:  cisapride  dronedarone  pimozide  thioridazine This medicine may also interact with the following medications:  alcohol  antihistamines for allergy, cough, and cold  atropine  barbiturate medicines for sleep or seizures, like phenobarbital  certain antibiotics like erythromycin or clarithromycin  certain medicines for anxiety or sleep  certain medicines for bladder problems like oxybutynin, tolterodine  certain medicines for depression or psychotic disturbances  certain medicines for irregular heart beat  certain medicines for Parkinson's disease like benztropine, trihexyphenidyl  certain medicines for seizures like phenobarbital, primidone  certain medicines for stomach problems like dicyclomine, hyoscyamine  certain medicines for travel sickness like scopolamine  ipratropium  narcotic medicines for pain  other medicines that prolong the QT interval (an abnormal heart rhythm) like dofetilide This list may not describe all possible interactions. Give your health care provider a list of all the medicines, herbs, non-prescription drugs, or dietary supplements you use. Also tell them if you smoke, drink alcohol, or use illegal drugs. Some items may interact with your medicine. What should I watch for while using this medicine? Tell your doctor or health care professional if your symptoms do not improve. You may get drowsy or dizzy. Do not drive, use machinery, or do anything that needs mental alertness until you know how this medicine affects you. Do not stand or sit up quickly, especially if you are an older patient. This reduces the risk of dizzy or fainting spells. Alcohol may  interfere with the effect of this medicine. Avoid alcoholic drinks. Your mouth may get dry. Chewing sugarless gum or sucking hard candy, and drinking plenty of water may help. Contact your doctor if the  problem does not go away or is severe. This medicine may cause dry eyes and blurred vision. If you wear contact lenses you may feel some discomfort. Lubricating drops may help. See your eye doctor if the problem does not go away or is severe. If you are receiving skin tests for allergies, tell your doctor you are using this medicine. What side effects may I notice from receiving this medicine? Side effects that you should report to your doctor or health care professional as soon as possible:  allergic reactions like skin rash, itching or hives, swelling of the face, lips, or tongue  changes in vision  confusion  fast, irregular heartbeat  seizures  tremor  trouble passing urine or change in the amount of urine Side effects that usually do not require medical attention (report to your doctor or health care professional if they continue or are bothersome):  constipation  drowsiness  dry mouth  headache  tiredness This list may not describe all possible side effects. Call your doctor for medical advice about side effects. You may report side effects to FDA at 1-800-FDA-1088. Where should I keep my medicine? Keep out of the reach of children. Store at room temperature between 15 and 30 degrees C (59 and 86 degrees F). Keep container tightly closed. Throw away any unused medicine after the expiration date. NOTE: This sheet is a summary. It may not cover all possible information. If you have questions about this medicine, talk to your doctor, pharmacist, or health care provider.  2020 Elsevier/Gold Standard (2018-06-30 13:19:55) Mirtazapine tablets What is this medicine? MIRTAZAPINE (mir TAZ a peen) is used to treat depression. This medicine may be used for other purposes; ask your health care provider or pharmacist if you have questions. COMMON BRAND NAME(S): Remeron What should I tell my health care provider before I take this medicine? They need to know if you have any of  these conditions:  bipolar disorder  glaucoma  kidney disease  liver disease  suicidal thoughts  an unusual or allergic reaction to mirtazapine, other medicines, foods, dyes, or preservatives  pregnant or trying to get pregnant  breast-feeding How should I use this medicine? Take this medicine by mouth with a glass of water. Follow the directions on the prescription label. Take your medicine at regular intervals. Do not take your medicine more often than directed. Do not stop taking this medicine suddenly except upon the advice of your doctor. Stopping this medicine too quickly may cause serious side effects or your condition may worsen. A special MedGuide will be given to you by the pharmacist with each prescription and refill. Be sure to read this information carefully each time. Talk to your pediatrician regarding the use of this medicine in children. Special care may be needed. Overdosage: If you think you have taken too much of this medicine contact a poison control center or emergency room at once. NOTE: This medicine is only for you. Do not share this medicine with others. What if I miss a dose? If you miss a dose, take it as soon as you can. If it is almost time for your next dose, take only that dose. Do not take double or extra doses. What may interact with this medicine? Do not take this medicine with any of the  following medications:  linezolid  MAOIs like Carbex, Eldepryl, Marplan, Nardil, and Parnate  methylene blue (injected into a vein) This medicine may also interact with the following medications:  alcohol  antiviral medicines for HIV or AIDS  certain medicines that treat or prevent blood clots like warfarin  certain medicines for depression, anxiety, or psychotic disturbances  certain medicines for fungal infections like ketoconazole and itraconazole  certain medicines for migraine headache like almotriptan, eletriptan, frovatriptan, naratriptan,  rizatriptan, sumatriptan, zolmitriptan  certain medicines for seizures like carbamazepine or phenytoin  certain medicines for sleep  cimetidine  erythromycin  fentanyl  lithium  medicines for blood pressure  nefazodone  rasagiline  rifampin  supplements like St. John's wort, kava kava, valerian  tramadol  tryptophan This list may not describe all possible interactions. Give your health care provider a list of all the medicines, herbs, non-prescription drugs, or dietary supplements you use. Also tell them if you smoke, drink alcohol, or use illegal drugs. Some items may interact with your medicine. What should I watch for while using this medicine? Tell your doctor if your symptoms do not get better or if they get worse. Visit your doctor or health care professional for regular checks on your progress. Because it may take several weeks to see the full effects of this medicine, it is important to continue your treatment as prescribed by your doctor. Patients and their families should watch out for new or worsening thoughts of suicide or depression. Also watch out for sudden changes in feelings such as feeling anxious, agitated, panicky, irritable, hostile, aggressive, impulsive, severely restless, overly excited and hyperactive, or not being able to sleep. If this happens, especially at the beginning of treatment or after a change in dose, call your health care professional. Dennis Bast may get drowsy or dizzy. Do not drive, use machinery, or do anything that needs mental alertness until you know how this medicine affects you. Do not stand or sit up quickly, especially if you are an older patient. This reduces the risk of dizzy or fainting spells. Alcohol may interfere with the effect of this medicine. Avoid alcoholic drinks. This medicine may cause dry eyes and blurred vision. If you wear contact lenses you may feel some discomfort. Lubricating drops may help. See your eye doctor if the  problem does not go away or is severe. Your mouth may get dry. Chewing sugarless gum or sucking hard candy, and drinking plenty of water may help. Contact your doctor if the problem does not go away or is severe. What side effects may I notice from receiving this medicine? Side effects that you should report to your doctor or health care professional as soon as possible:  allergic reactions like skin rash, itching or hives, swelling of the face, lips, or tongue  anxious  changes in vision  chest pain  confusion  elevated mood, decreased need for sleep, racing thoughts, impulsive behavior  eye pain  fast, irregular heartbeat  feeling faint or lightheaded, falls  feeling agitated, angry, or irritable  fever or chills, sore throat  hallucination, loss of contact with reality  loss of balance or coordination  mouth sores  redness, blistering, peeling or loosening of the skin, including inside the mouth  restlessness, pacing, inability to keep still  seizures  stiff muscles  suicidal thoughts or other mood changes  trouble passing urine or change in the amount of urine  trouble sleeping  unusual bleeding or bruising  unusually weak or tired  vomiting Side  effects that usually do not require medical attention (report to your doctor or health care professional if they continue or are bothersome):  change in appetite  constipation  dizziness  dry mouth  muscle aches or pains  nausea  tired  weight gain This list may not describe all possible side effects. Call your doctor for medical advice about side effects. You may report side effects to FDA at 1-800-FDA-1088. Where should I keep my medicine? Keep out of the reach of children. Store at room temperature between 15 and 30 degrees C (59 and 86 degrees F) Protect from light and moisture. Throw away any unused medicine after the expiration date. NOTE: This sheet is a summary. It may not cover all  possible information. If you have questions about this medicine, talk to your doctor, pharmacist, or health care provider.  2020 Elsevier/Gold Standard (2015-12-08 17:30:45)

## 2019-03-23 ENCOUNTER — Other Ambulatory Visit: Payer: Self-pay

## 2019-03-23 ENCOUNTER — Ambulatory Visit (INDEPENDENT_AMBULATORY_CARE_PROVIDER_SITE_OTHER): Payer: Medicare Other | Admitting: Psychiatry

## 2019-03-23 ENCOUNTER — Encounter: Payer: Self-pay | Admitting: Psychiatry

## 2019-03-23 DIAGNOSIS — F1220 Cannabis dependence, uncomplicated: Secondary | ICD-10-CM | POA: Insufficient documentation

## 2019-03-23 DIAGNOSIS — F5105 Insomnia due to other mental disorder: Secondary | ICD-10-CM | POA: Diagnosis not present

## 2019-03-23 DIAGNOSIS — F17200 Nicotine dependence, unspecified, uncomplicated: Secondary | ICD-10-CM

## 2019-03-23 DIAGNOSIS — F419 Anxiety disorder, unspecified: Secondary | ICD-10-CM

## 2019-03-23 DIAGNOSIS — F3160 Bipolar disorder, current episode mixed, unspecified: Secondary | ICD-10-CM

## 2019-03-23 DIAGNOSIS — F1010 Alcohol abuse, uncomplicated: Secondary | ICD-10-CM | POA: Insufficient documentation

## 2019-03-23 NOTE — Progress Notes (Signed)
Virtual Visit via Video Note  I connected with Benjamin Carter on 03/23/19 at  4:45 PM EDT by a video enabled telemedicine application and verified that I am speaking with the correct person using two identifiers.   I discussed the limitations of evaluation and management by telemedicine and the availability of in person appointments. The patient expressed understanding and agreed to proceed.    I discussed the assessment and treatment plan with the patient. The patient was provided an opportunity to ask questions and all were answered. The patient agreed with the plan and demonstrated an understanding of the instructions.   The patient was advised to call back or seek an in-person evaluation if the symptoms worsen or if the condition fails to improve as anticipated.   Bradley MD OP Progress Note  03/23/2019 5:12 PM Benjamin Carter  MRN:  [id number]  Chief Complaint:  Chief Complaint    Follow-up     HPI: [name] is a 38 year old Caucasian male,, engaged, lives in [place] , has a history of bipolar disorder, anxiety disorder unspecified, hypertension, seizure disorder, history of diabetes melitis, amputation of fingers left sided was evaluated by telemedicine today.  Patient today reports he continues to be anxious and restless.  He reports he has stopped taking the mirtazapine since it was making him drowsy the next day.  He also felt it was not helpful with his sleep.  Patient had told writer last visit that he will think about the medication that worked for him in the past-mood stabilizer for his bipolar symptoms.  Patient today reports he did not get a chance to think about a mood stabilizer that may have worked for him.  He reports that he knows what works for him and that is Xanax.  Patient today is adamant that he does not want to try any other medication that will give him more side effects and his body accepts only Xanax and that is the only medication that he wants.  Patient reports  he has been smoking cannabis to help him with his anxiety symptoms in the afternoon and taking Xanax as needed from an old prescription that he had.  Patient reports he has been taking the Xanax on and off.  Discussed with patient that Xanax is not the treatment for his bipolar disorder.  Discussed with him that there are multiple other medications which can be prescribed for his sleep and mood.  Discussed with him that Xanax cannot be prescribed for long-term and that it comes with long-term side effects and risk factors.  Patient is also on oxycodone and hence his risk is higher.  Patient however became irritable and hesitant to discuss any other medication today.  Discussed genetic testing-GeneSight testing today with patient.  Discussed with him that if he is willing to do that the nurse will give him a call and guide him through that.  Patient agrees with it.  Patient denies any suicidality, homicidality or perceptual disturbances. Visit Diagnosis:    ICD-10-CM   1. Bipolar I disorder, most recent episode mixed ([facility])  F31.60   2. Insomnia due to mental condition  F51.05   3. Anxiety disorder, unspecified type  F41.9   4. Cannabis use disorder, moderate, dependence (HCC)  F12.20   5. Tobacco use disorder  F17.200   6. Alcohol use disorder, mild, abuse  F10.10     Past Psychiatric History: I have reviewed past psychiatric history from my progress note on 02/16/2019.  Past trials of Seroquel,  Zoloft, lithium, Ritalin, Klonopin, Adderall, Valium, Celexa, Xanax, Depakote, risperidone, Zyprexa, Neurontin.  Past Medical History:  Past Medical History:  Diagnosis Date  . Anxiety   . Arthritis   . Bipolar 1 disorder ([place])   . Depression   . History of MRSA infection    left index finger 03-07-2015 w/ positive blood cultures  . Hyperlipidemia   . Left hand pain   . Osteomyelitis of finger of left hand ([place])   . Wears glasses     Past Surgical History:  Procedure Laterality Date  .  AMPUTATION Left 03/10/2014   Procedure: Irrigation and Debridement and Revision of Amputation Left Index Finger;  Surgeon: Linna Hoff, MD;  Location: [facility];  Service: Orthopedics;  Laterality: Left;  Wants to go at 5PM  . AMPUTATION Left 10/21/2014   Procedure: LEFT LONG FINGER AMPUTATION REVISION;  Surgeon: Iran Planas, MD;  Location: [facility];  Service: Orthopedics;  Laterality: Left;  . CHOLECYSTECTOMY N/A 12/07/2013   Procedure: LAPAROSCOPIC CHOLECYSTECTOMY;  Surgeon: Jamesetta So, MD;  Location: [facility];  Service: General;  Laterality: N/A;  . GALLBLADDER SURGERY  2015  . I&D EXTREMITY Left 03/07/2014   Procedure: IRRIGATION AND DEBRIDEMENT INDEX FINGER;  Surgeon: Linna Hoff, MD;  Location: [facility];  Service: Orthopedics;  Laterality: Left;  . [place] SURGERY  2004   Substance abuse history- patient uses cannabis every day-does not elaborate how much he uses.  He also drinks alcohol 2-3 drinks every other day.  Family Psychiatric History: I have reviewed family psychiatric history from my progress note on 02/16/2019.  Family History:  Family History  Problem Relation Age of Onset  . Diabetes Mother   . Hyperlipidemia Mother   . Hypertension Mother   . Heart disease Mother   . Kidney failure Mother   . Neuropathy Mother   . Bipolar disorder Mother   . Heart attack Father        38, 36 first MI  . Cancer Father        colon upper 22's  . Bipolar disorder Father   . Stroke Maternal Grandmother   . Cirrhosis Maternal Grandfather   . Mental illness Cousin   . Suicidality Cousin     Social History: I have reviewed social history from my progress note on 02/16/2019. Social History   Socioeconomic History  . Marital status: Single    Spouse name: Not on file  . Number of children: 1  . Years of education: Not on file  . Highest education level: High school graduate  Occupational History  . Not on file  Social Needs  . Financial resource strain: Not very hard  . Food  insecurity    Worry: Never true    Inability: Never true  . Transportation needs    Medical: No    Non-medical: No  Tobacco Use  . Smoking status: Current Every Day Smoker    Packs/day: 1.00    Years: 20.00    Pack years: 20.00    Types: Cigarettes  . Smokeless tobacco: Never Used  Substance and Sexual Activity  . Alcohol use: Yes    Comment: social infrequently  . Drug use: Yes    Frequency: 4.0 times per week    Types: Marijuana  . Sexual activity: Yes    Birth control/protection: Surgical    Comment: girlfriend had surgery, 55year old girl  Lifestyle  . Physical activity    Days per week: 7 days  Minutes per session: 30 min  . Stress: Rather much  Relationships  . Social Herbalist on phone: Never    Gets together: Not on file    Attends religious service: Never    Active member of club or organization: No    Attends meetings of clubs or organizations: Never    Relationship status: Living with partner  Other Topics Concern  . Not on file  Social History Narrative  . Not on file    Allergies:  Allergies  Allergen Reactions  . Bactrim [Sulfamethoxazole-Trimethoprim]     Caused pancreatitis  . Doxycycline   . Sulfites     Caused elevated liver enzymes  . Toradol [Ketorolac Tromethamine] Rash    Metabolic Disorder Labs: No results found for: HGBA1C, MPG No results found for: PROLACTIN Lab Results  Component Value Date   CHOL 183 07/10/2018   TRIG 116 07/10/2018   HDL 53 07/10/2018   CHOLHDL 3.5 07/10/2018   LDLCALC 107 (H) 07/10/2018   No results found for: TSH  Therapeutic Level Labs: No results found for: LITHIUM No results found for: VALPROATE No components found for:  CBMZ  Current Medications: Current Outpatient Medications  Medication Sig Dispense Refill  . cloNIDine (CATAPRES) 0.1 MG tablet Take 1 tablet (0.1 mg total) by mouth 2 (two) times daily. 180 tablet 1  . hydrOXYzine (VISTARIL) 25 MG capsule Take 1 capsule (25 mg  total) by mouth daily as needed. Only for severe anxiety,agitation 30 capsule 1  . lisinopril (PRINIVIL,ZESTRIL) 10 MG tablet Take 1 tablet (10 mg total) by mouth daily. 90 tablet 1  . Multiple Vitamin (MULTIVITAMIN WITH MINERALS) TABS tablet Take 1 tablet by mouth daily.    Marland Kitchen oxyCODONE-acetaminophen (PERCOCET) 10-325 MG tablet oxycodone-acetaminophen 10 mg-325 mg tablet  TK 1 T PO Q 6 H FOR 10 DAYS     No current facility-administered medications for this visit.      Musculoskeletal: Strength & Muscle Tone: UTA Gait & Station: normal Patient leans: N/A  Psychiatric Specialty Exam: Review of Systems  Psychiatric/Behavioral: The patient is nervous/anxious and has insomnia.   All other systems reviewed and are negative.   There were no vitals taken for this visit.There is no height or weight on file to calculate BMI.  General Appearance: Casual  Eye Contact:  Fair  Speech:  Clear and Coherent  Volume:  Normal  Mood:  Anxious  Affect:  Congruent  Thought Process:  Goal Directed and Descriptions of Associations: Intact  Orientation:  Full (Time, Place, and Person)  Thought Content: Logical   Suicidal Thoughts:  No  Homicidal Thoughts:  No  Memory:  Immediate;   Fair Recent;   Fair Remote;   Fair  Judgement:  Fair  Insight:  Fair  Psychomotor Activity:  Normal  Concentration:  Concentration: Fair and Attention Span: Fair  Recall:  [employer] of Knowledge: Fair  Language: Fair  Akathisia:  No  Handed:  Right  AIMS (if indicated): denies tremors, rigidity  Assets:  Communication Skills Desire for Improvement Housing Intimacy Social Support  ADL's:  Intact  Cognition: WNL  Sleep:  restless   Screenings: PHQ2-9     Clinical Support from 02/05/2019 in [facility] Visit from 08/14/2018 in [facility] Visit from 07/10/2018 in [facility]  PHQ-2 Total Score  2  1  0  PHQ-9 Total Score  7  -   -  Assessment and Plan: Khadim is a 38 year old Caucasian male, employed, engaged, lives in [place] has a history of bipolar disorder, PTSD, anxiety disorder, hypertension, seizure disorder, cannabis abuse, was evaluated by telemedicine today.  Patient is biologically predisposed given his history of trauma, substance abuse problems.  He also has psychosocial stressors of his chronic health issues.  Patient with noncompliance to medication regimen- had's recent side effects to his mirtazapine.  Patient however today adamant that he does not want to try any new medications other than Xanax.  Provided medication education.  Discussed genetic testing.  Plan Bipolar disorder-unstable Patient declines medications today. Will refer for GeneSight testing.  Insomnia-unstable Patient declines medications.  For anxiety disorder unspecified-unstable Patient referred to therapist here for psychotherapy sessions. Hydroxyzine 25 mg p.o. daily as needed for anxiety attacks  Cannabis abuse- moderate-unstable Provided substance abuse counseling.  Tobacco use disorder-unstable Provided smoking cessation counseling.  Alcohol abuse mild - provided substance abuse counseling - will monitor  TSH labs-pending.  Patient is interested in GeneSight testing- Janett Billow CMA to call patient to get testing done.  Follow-up in clinic after he completes his GeneSight testing and it is reported.  Since patient is declining medications.  I have spent atleast 15 minutes non face to face with patient today. More than 50 % of the time was spent for psychoeducation and supportive psychotherapy and care coordination. This note was generated in part or whole with voice recognition software. Voice recognition is usually quite accurate but there are transcription errors that can and very often do occur. I apologize for any typographical errors that were not detected and corrected.        Ursula Alert,  MD 03/23/2019, 5:12 PM

## 2019-12-31 ENCOUNTER — Emergency Department (HOSPITAL_COMMUNITY)
Admission: EM | Admit: 2019-12-31 | Discharge: 2019-12-31 | Disposition: A | Discharge status: Home / Self Care | Payer: Medicare Other | Attending: Emergency Medicine | Admitting: Emergency Medicine

## 2019-12-31 ENCOUNTER — Other Ambulatory Visit: Payer: Self-pay

## 2019-12-31 ENCOUNTER — Encounter (HOSPITAL_COMMUNITY): Payer: Self-pay | Admitting: *Deleted

## 2019-12-31 DIAGNOSIS — Z5321 Procedure and treatment not carried out due to patient leaving prior to being seen by health care provider: Secondary | ICD-10-CM | POA: Insufficient documentation

## 2019-12-31 DIAGNOSIS — R509 Fever, unspecified: Secondary | ICD-10-CM | POA: Diagnosis present

## 2019-12-31 LAB — CBC WITH DIFFERENTIAL/PLATELET
Abs Immature Granulocytes: 0.06 10*3/uL (ref 0.00–0.07)
Basophils Absolute: 0.1 10*3/uL (ref 0.0–0.1)
Basophils Relative: 1 %
Eosinophils Absolute: 0.2 10*3/uL (ref 0.0–0.5)
Eosinophils Relative: 2 %
HCT: 45.3 % (ref 39.0–52.0)
Hemoglobin: 15.4 g/dL (ref 13.0–17.0)
Immature Granulocytes: 1 %
Lymphocytes Relative: 32 %
Lymphs Abs: 3.2 10*3/uL (ref 0.7–4.0)
MCH: 31.8 pg (ref 26.0–34.0)
MCHC: 34 g/dL (ref 30.0–36.0)
MCV: 93.6 fL (ref 80.0–100.0)
Monocytes Absolute: 0.9 10*3/uL (ref 0.1–1.0)
Monocytes Relative: 9 %
Neutro Abs: 5.6 10*3/uL (ref 1.7–7.7)
Neutrophils Relative %: 55 %
Platelets: 233 10*3/uL (ref 150–400)
RBC: 4.84 MIL/uL (ref 4.22–5.81)
RDW: 13.1 % (ref 11.5–15.5)
WBC: 10 10*3/uL (ref 4.0–10.5)
nRBC: 0 % (ref 0.0–0.2)

## 2019-12-31 LAB — COMPREHENSIVE METABOLIC PANEL
ALT: 34 U/L (ref 0–44)
AST: 30 U/L (ref 15–41)
Albumin: 4.2 g/dL (ref 3.5–5.0)
Alkaline Phosphatase: 58 U/L (ref 38–126)
Anion gap: 9 (ref 5–15)
BUN: 12 mg/dL (ref 6–20)
CO2: 25 mmol/L (ref 22–32)
Calcium: 9.1 mg/dL (ref 8.9–10.3)
Chloride: 103 mmol/L (ref 98–111)
Creatinine, Ser: 1.03 mg/dL (ref 0.61–1.24)
GFR calc Af Amer: >60 mL/min (ref >60)
GFR calc non Af Amer: >60 mL/min (ref >60)
Glucose, Bld: 83 mg/dL (ref 70–99)
Potassium: 4 mmol/L (ref 3.5–5.1)
Sodium: 137 mmol/L (ref 135–145)
Total Bilirubin: 0.9 mg/dL (ref 0.3–1.2)
Total Protein: 7.8 g/dL (ref 6.5–8.1)

## 2019-12-31 LAB — LIPASE, BLOOD: Lipase: 44 U/L (ref 11–51)

## 2019-12-31 MED ORDER — SODIUM CHLORIDE 0.9 % IV BOLUS: 1000.0000 mL | Freq: Once | INTRAVENOUS | Status: DC

## 2019-12-31 NOTE — ED Notes (Signed)
Patient became angry when this nurse explained need for IV and fluids.  Patient stated " and why that doctor send you all in here finally to do something for me", patient continued to shout at this nurse stating "I don't need any of these things, I want to leave AMA".  Patient then left room without signing.  MD made aware.

## 2019-12-31 NOTE — ED Triage Notes (Signed)
States he is concerned that he has been out in the heat for the past several days, states he has has multiple episodes of heat related illness. States today he cannot keep any food or liquids down.

## 2020-03-18 DIAGNOSIS — M868X4 Other osteomyelitis, hand: Secondary | ICD-10-CM | POA: Diagnosis not present

## 2020-06-20 ENCOUNTER — Ambulatory Visit (INDEPENDENT_AMBULATORY_CARE_PROVIDER_SITE_OTHER): Payer: Medicare Other

## 2020-06-20 ENCOUNTER — Other Ambulatory Visit: Payer: Self-pay

## 2020-06-20 ENCOUNTER — Ambulatory Visit
Admission: EM | Admit: 2020-06-20 | Discharge: 2020-06-20 | Disposition: A | Discharge status: Home / Self Care | Payer: Medicare Other | Attending: Emergency Medicine | Admitting: Emergency Medicine

## 2020-06-20 DIAGNOSIS — M549 Dorsalgia, unspecified: Secondary | ICD-10-CM | POA: Diagnosis not present

## 2020-06-20 DIAGNOSIS — R079 Chest pain, unspecified: Secondary | ICD-10-CM | POA: Diagnosis not present

## 2020-06-20 DIAGNOSIS — R0789 Other chest pain: Secondary | ICD-10-CM | POA: Diagnosis not present

## 2020-06-20 DIAGNOSIS — R059 Cough, unspecified: Secondary | ICD-10-CM

## 2020-06-20 DIAGNOSIS — M79621 Pain in right upper arm: Secondary | ICD-10-CM | POA: Diagnosis not present

## 2020-06-20 DIAGNOSIS — Z1152 Encounter for screening for COVID-19: Secondary | ICD-10-CM | POA: Diagnosis not present

## 2020-06-20 DIAGNOSIS — Z20822 Contact with and (suspected) exposure to covid-19: Secondary | ICD-10-CM | POA: Diagnosis not present

## 2020-06-20 MED ORDER — ALBUTEROL SULFATE HFA 108 (90 BASE) MCG/ACT IN AERS: 1.0000 | INHALATION_SPRAY | Freq: Four times a day (QID) | RESPIRATORY_TRACT | 0 refills | Status: DC | PRN

## 2020-06-20 MED ORDER — BENZONATATE 100 MG PO CAPS: 100.0000 mg | ORAL_CAPSULE | Freq: Three times a day (TID) | ORAL | 0 refills | Status: DC | PRN

## 2020-06-20 NOTE — ED Triage Notes (Signed)
Pt presents with chest pain and cough , pt states pain is substernal and radiates to back and right arm

## 2020-06-20 NOTE — Discharge Instructions (Signed)
Chest pain precautions given. COVID-19, RSV, flu A/B test will take 2 to 7 days for results to return.  Someone will call you if your result is positive. ProAir was prescribed/take as directed Tessalon Perles prescribed for cough Follow-up with PCP Return or go to ED if you develop any new or worsening of your symptoms

## 2020-06-20 NOTE — ED Provider Notes (Signed)
[facility]   [id number] 06/20/20 Arrival Time: 2542   CC: CHEST PAIN  SUBJECTIVE:  Benjamin Carter is a 39 y.o. male who presented to the urgent care with a complaint of chest pain and cough for the past few days.  Reports chest pain radiating to back and right arm..  Denies a precipitating event, trauma, or strenuous upper body activities.  Localizes chest pain to the substernal region.  Describes as worsening with wax and waning achy pain with episode lasting 5 minutes.  Rates pain as 8/10.   Has tried OTC medication with no relief.  Symptoms made worse with activity and changing position.  Denies radiates symptoms.  Denies previous symptoms in the past.  Denies fever, chills, lightheadedness, dizziness, palpitations, tachycardia, SOB, nausea, vomiting, abdominal pain, changes in bowel or bladder habits, diaphoresis, numbness/tingling in extremities, peripheral edema, or anxiety.     Denies close relatives with cardiac hx  Previous cardiac testing: none.  ROS: As per HPI.  All other pertinent ROS negative.    Past Medical History:  Diagnosis Date  . Anxiety   . Arthritis   . Bipolar 1 disorder ([place])   . Depression   . History of MRSA infection    left index finger 03-07-2015 w/ positive blood cultures  . Hyperlipidemia   . Left hand pain   . Osteomyelitis of finger of left hand ([place])   . Wears glasses    Past Surgical History:  Procedure Laterality Date  . AMPUTATION Left 03/10/2014   Procedure: Irrigation and Debridement and Revision of Amputation Left Index Finger;  Surgeon: Linna Hoff, MD;  Location: [facility];  Service: Orthopedics;  Laterality: Left;  Wants to go at 5PM  . AMPUTATION Left 10/21/2014   Procedure: LEFT LONG FINGER AMPUTATION REVISION;  Surgeon: Iran Planas, MD;  Location: [facility];  Service: Orthopedics;  Laterality: Left;  . CHOLECYSTECTOMY N/A 12/07/2013   Procedure: LAPAROSCOPIC CHOLECYSTECTOMY;  Surgeon: Jamesetta So, MD;  Location: [facility];   Service: General;  Laterality: N/A;  . GALLBLADDER SURGERY  2015  . I & D EXTREMITY Left 03/07/2014   Procedure: IRRIGATION AND DEBRIDEMENT INDEX FINGER;  Surgeon: Linna Hoff, MD;  Location: [facility];  Service: Orthopedics;  Laterality: Left;  . LUMBAR DISC SURGERY  2004   Allergies  Allergen Reactions  . Bactrim [Sulfamethoxazole-Trimethoprim]     Caused pancreatitis  . Doxycycline   . Sulfites     Caused elevated liver enzymes  . Toradol [Ketorolac Tromethamine] Rash   No current facility-administered medications on file prior to encounter.   Current Outpatient Medications on File Prior to Encounter  Medication Sig Dispense Refill  . cloNIDine (CATAPRES) 0.1 MG tablet Take 1 tablet (0.1 mg total) by mouth 2 (two) times daily. 180 tablet 1  . hydrOXYzine (VISTARIL) 25 MG capsule Take 1 capsule (25 mg total) by mouth daily as needed. Only for severe anxiety,agitation 30 capsule 1  . lisinopril (PRINIVIL,ZESTRIL) 10 MG tablet Take 1 tablet (10 mg total) by mouth daily. 90 tablet 1  . Multiple Vitamin (MULTIVITAMIN WITH MINERALS) TABS tablet Take 1 tablet by mouth daily.    Marland Kitchen oxyCODONE-acetaminophen (PERCOCET) 10-325 MG tablet oxycodone-acetaminophen 10 mg-325 mg tablet  TK 1 T PO Q 6 H FOR 10 DAYS     Social History   Socioeconomic History  . Marital status: Single    Spouse name: Not on file  . Number of children: 1  . Years of education: Not on  file  . Highest education level: High school graduate  Occupational History  . Not on file  Tobacco Use  . Smoking status: Current Every Day Smoker    Packs/day: 1.00    Years: 20.00    Pack years: 20.00    Types: Cigarettes  . Smokeless tobacco: Never Used  Vaping Use  . Vaping Use: Never used  Substance and Sexual Activity  . Alcohol use: Yes    Comment: social infrequently  . Drug use: Yes    Frequency: 4.0 times per week    Types: Marijuana  . Sexual activity: Yes    Birth control/protection: Surgical    Comment:  girlfriend had surgery, 63year old girl  Other Topics Concern  . Not on file  Social History Narrative  . Not on file   Social Determinants of Health   Financial Resource Strain:   . Difficulty of Paying Living Expenses: Not on file  Food Insecurity:   . Worried About Charity fundraiser in the Last Year: Not on file  . Ran Out of Food in the Last Year: Not on file  Transportation Needs:   . Lack of Transportation (Medical): Not on file  . Lack of Transportation (Non-Medical): Not on file  Physical Activity:   . Days of Exercise per Week: Not on file  . Minutes of Exercise per Session: Not on file  Stress:   . Feeling of Stress : Not on file  Social Connections:   . Frequency of Communication with Friends and Family: Not on file  . Frequency of Social Gatherings with Friends and Family: Not on file  . Attends Religious Services: Not on file  . Active Member of Clubs or Organizations: Not on file  . Attends Archivist Meetings: Not on file  . Marital Status: Not on file  Intimate Partner Violence:   . Fear of Current or Ex-Partner: Not on file  . Emotionally Abused: Not on file  . Physically Abused: Not on file  . Sexually Abused: Not on file   Family History  Problem Relation Age of Onset  . Diabetes Mother   . Hyperlipidemia Mother   . Hypertension Mother   . Heart disease Mother   . Kidney failure Mother   . Neuropathy Mother   . Bipolar disorder Mother   . Heart attack Father        64, 62 first MI  . Cancer Father        colon upper 60's  . Bipolar disorder Father   . Stroke Maternal Grandmother   . Cirrhosis Maternal Grandfather   . Mental illness Cousin   . Suicidality Cousin      OBJECTIVE:  Vitals:   06/20/20 0911  BP: 120/76  Pulse: 66  Resp: 20  Temp: 98.3 F (36.8 C)  SpO2: 97%    General appearance: alert; no distress Eyes: PERRLA; EOMI; conjunctiva normal HENT: normocephalic; atraumatic Neck: supple Lungs: clear to  auscultation bilaterally without adventitious breath sounds Heart: regular rate and rhythm.  Clear S1 and S2 without rubs, gallops, or murmur. Chest Wall:  no thrills Abdomen: soft, non-tender; bowel sounds normal; no masses or organomegaly; no guarding or rebound tenderness Extremities: no cyanosis or edema; symmetrical with no gross deformities Skin: warm and dry Psychological: alert and cooperative; normal mood and affect  ECG: Orders placed or performed during the hospital encounter of 12/31/19  . ED EKG  . ED EKG  . EKG 12-Lead  . EKG 12-Lead  .  EKG    EKG normal sinus rhythm without ST elevations, depressions, or prolonged PR interval.  No narrowing or widening of the QRS complexes.    LABS:  Results for orders placed or performed during the hospital encounter of 12/31/19  CBC with Differential  Result Value Ref Range   WBC 10.0 4.0 - 10.5 K/uL   RBC 4.84 4.22 - 5.81 MIL/uL   Hemoglobin 15.4 13.0 - 17.0 g/dL   HCT 45.3 39 - 52 %   MCV 93.6 80.0 - 100.0 fL   MCH 31.8 26.0 - 34.0 pg   MCHC 34.0 30.0 - 36.0 g/dL   RDW 13.1 11.5 - 15.5 %   Platelets 233 150 - 400 K/uL   nRBC 0.0 0.0 - 0.2 %   Neutrophils Relative % 55 %   Neutro Abs 5.6 1.7 - 7.7 K/uL   Lymphocytes Relative 32 %   Lymphs Abs 3.2 0.7 - 4.0 K/uL   Monocytes Relative 9 %   Monocytes Absolute 0.9 0.1 - 1.0 K/uL   Eosinophils Relative 2 %   Eosinophils Absolute 0.2 0.0 - 0.5 K/uL   Basophils Relative 1 %   Basophils Absolute 0.1 0.0 - 0.1 K/uL   Immature Granulocytes 1 %   Abs Immature Granulocytes 0.06 0.00 - 0.07 K/uL  Lipase, blood  Result Value Ref Range   Lipase 44 11 - 51 U/L  Comprehensive metabolic panel  Result Value Ref Range   Sodium 137 135 - 145 mmol/L   Potassium 4.0 3.5 - 5.1 mmol/L   Chloride 103 98 - 111 mmol/L   CO2 25 22 - 32 mmol/L   Glucose, Bld 83 70 - 99 mg/dL   BUN 12 6 - 20 mg/dL   Creatinine, Ser 1.03 0.61 - 1.24 mg/dL   Calcium 9.1 8.9 - 10.3 mg/dL   Total Protein 7.8  6.5 - 8.1 g/dL   Albumin 4.2 3.5 - 5.0 g/dL   AST 30 15 - 41 U/L   ALT 34 0 - 44 U/L   Alkaline Phosphatase 58 38 - 126 U/L   Total Bilirubin 0.9 0.3 - 1.2 mg/dL   GFR calc non Af Amer >60 >60 mL/min   GFR calc Af Amer >60 >60 mL/min   Anion gap 9 5 - 15   Labs Reviewed  COVID-19, FLU A+B AND RSV    DIAGNOSTIC STUDIES:  DG Chest 2 View  Result Date: 06/20/2020 CLINICAL DATA:  Chest pain and cough. Pain radiates to the back and right arm. Current smoker. EXAM: CHEST - 2 VIEW COMPARISON:  11/06/2013. FINDINGS: Trachea is midline. Heart size and basilar pulmonary markings are likely accentuated by somewhat low lung volumes. Lungs are otherwise clear. No pleural fluid. IMPRESSION: No acute findings. Electronically Signed   By: Lorin Picket M.D.   On: 06/20/2020 09:45    Chest x-ray is negative for acute cardiopulmonary disease.  I have reviewed the x-ray myself and the radiologist interpretation.  I am in agreement with the radiologist interpretation.   ASSESSMENT & PLAN:  1. Other chest pain   2. Encounter for screening for COVID-19   3. Cough     Meds ordered this encounter  Medications  . albuterol (VENTOLIN HFA) 108 (90 Base) MCG/ACT inhaler    Sig: Inhale 1-2 puffs into the lungs every 6 (six) hours as needed for shortness of breath.    Dispense:  18 g    Refill:  0  . benzonatate (TESSALON) 100 MG capsule  Sig: Take 1 capsule (100 mg total) by mouth 3 (three) times daily as needed for cough.    Dispense:  30 capsule    Refill:  0    Patient history and exam consistent with non-cardiac cause of chest pain.   Discharge instructions  Chest pain precautions given. COVID-19, RSV, flu A/B test will take 2 to 7 days for results to return.  Someone will call you if your result is positive. ProAir was prescribed/take as directed Tessalon Perles prescribed for cough Follow-up with PCP Return or go to ED if you develop any new or worsening of your symptoms  Reviewed  expectations re: course of current medical issues. Questions answered. Outlined signs and symptoms indicating need for more acute intervention. Patient verbalized understanding. After Visit Summary given.   Emerson Monte, FNP 06/20/20 1001

## 2020-06-22 LAB — COVID-19, FLU A+B AND RSV
Influenza A, NAA: NOT DETECTED
Influenza B, NAA: NOT DETECTED
RSV, NAA: NOT DETECTED
SARS-CoV-2, NAA: DETECTED — AB

## 2020-08-29 ENCOUNTER — Other Ambulatory Visit: Payer: Self-pay

## 2020-08-29 ENCOUNTER — Emergency Department (HOSPITAL_COMMUNITY): Payer: No Typology Code available for payment source

## 2020-08-29 ENCOUNTER — Emergency Department (HOSPITAL_COMMUNITY)
Admission: EM | Admit: 2020-08-29 | Discharge: 2020-08-29 | Discharge status: Against Medical Advice | Payer: No Typology Code available for payment source | Attending: Emergency Medicine | Admitting: Emergency Medicine

## 2020-08-29 ENCOUNTER — Encounter (HOSPITAL_COMMUNITY): Payer: Self-pay

## 2020-08-29 DIAGNOSIS — R52 Pain, unspecified: Secondary | ICD-10-CM | POA: Diagnosis not present

## 2020-08-29 DIAGNOSIS — R609 Edema, unspecified: Secondary | ICD-10-CM | POA: Diagnosis not present

## 2020-08-29 DIAGNOSIS — S299XXA Unspecified injury of thorax, initial encounter: Secondary | ICD-10-CM | POA: Diagnosis not present

## 2020-08-29 DIAGNOSIS — S36039A Unspecified laceration of spleen, initial encounter: Secondary | ICD-10-CM | POA: Diagnosis not present

## 2020-08-29 DIAGNOSIS — S0083XA Contusion of other part of head, initial encounter: Secondary | ICD-10-CM | POA: Diagnosis not present

## 2020-08-29 DIAGNOSIS — R0789 Other chest pain: Secondary | ICD-10-CM | POA: Diagnosis not present

## 2020-08-29 DIAGNOSIS — S4991XA Unspecified injury of right shoulder and upper arm, initial encounter: Secondary | ICD-10-CM

## 2020-08-29 DIAGNOSIS — Z79899 Other long term (current) drug therapy: Secondary | ICD-10-CM | POA: Insufficient documentation

## 2020-08-29 DIAGNOSIS — R1084 Generalized abdominal pain: Secondary | ICD-10-CM | POA: Diagnosis not present

## 2020-08-29 DIAGNOSIS — R109 Unspecified abdominal pain: Secondary | ICD-10-CM | POA: Diagnosis not present

## 2020-08-29 DIAGNOSIS — F17210 Nicotine dependence, cigarettes, uncomplicated: Secondary | ICD-10-CM | POA: Diagnosis not present

## 2020-08-29 DIAGNOSIS — T1490XA Injury, unspecified, initial encounter: Secondary | ICD-10-CM

## 2020-08-29 DIAGNOSIS — M25511 Pain in right shoulder: Secondary | ICD-10-CM | POA: Diagnosis not present

## 2020-08-29 DIAGNOSIS — Z20822 Contact with and (suspected) exposure to covid-19: Secondary | ICD-10-CM | POA: Insufficient documentation

## 2020-08-29 DIAGNOSIS — M25519 Pain in unspecified shoulder: Secondary | ICD-10-CM | POA: Diagnosis not present

## 2020-08-29 DIAGNOSIS — R001 Bradycardia, unspecified: Secondary | ICD-10-CM | POA: Diagnosis not present

## 2020-08-29 DIAGNOSIS — Y92410 Unspecified street and highway as the place of occurrence of the external cause: Secondary | ICD-10-CM | POA: Insufficient documentation

## 2020-08-29 DIAGNOSIS — I10 Essential (primary) hypertension: Secondary | ICD-10-CM | POA: Insufficient documentation

## 2020-08-29 DIAGNOSIS — R102 Pelvic and perineal pain: Secondary | ICD-10-CM | POA: Diagnosis not present

## 2020-08-29 DIAGNOSIS — Z041 Encounter for examination and observation following transport accident: Secondary | ICD-10-CM | POA: Diagnosis not present

## 2020-08-29 DIAGNOSIS — R079 Chest pain, unspecified: Secondary | ICD-10-CM | POA: Diagnosis not present

## 2020-08-29 LAB — I-STAT CHEM 8, ED
BUN: 10 mg/dL (ref 6–20)
Calcium, Ion: 1.2 mmol/L (ref 1.15–1.40)
Chloride: 103 mmol/L (ref 98–111)
Creatinine, Ser: 1 mg/dL (ref 0.61–1.24)
Glucose, Bld: 117 mg/dL — ABNORMAL HIGH (ref 70–99)
HCT: 52 % (ref 39.0–52.0)
Hemoglobin: 17.7 g/dL — ABNORMAL HIGH (ref 13.0–17.0)
Potassium: 4.1 mmol/L (ref 3.5–5.1)
Sodium: 140 mmol/L (ref 135–145)
TCO2: 26 mmol/L (ref 22–32)

## 2020-08-29 LAB — SAMPLE TO BLOOD BANK

## 2020-08-29 LAB — COMPREHENSIVE METABOLIC PANEL
ALT: 35 U/L (ref 0–44)
AST: 44 U/L — ABNORMAL HIGH (ref 15–41)
Albumin: 4.6 g/dL (ref 3.5–5.0)
Alkaline Phosphatase: 64 U/L (ref 38–126)
Anion gap: 8 (ref 5–15)
BUN: 11 mg/dL (ref 6–20)
CO2: 25 mmol/L (ref 22–32)
Calcium: 9.3 mg/dL (ref 8.9–10.3)
Chloride: 102 mmol/L (ref 98–111)
Creatinine, Ser: 1.07 mg/dL (ref 0.61–1.24)
GFR, Estimated: >60 mL/min (ref >60)
Glucose, Bld: 121 mg/dL — ABNORMAL HIGH (ref 70–99)
Potassium: 3.9 mmol/L (ref 3.5–5.1)
Sodium: 135 mmol/L (ref 135–145)
Total Bilirubin: 1.3 mg/dL — ABNORMAL HIGH (ref 0.3–1.2)
Total Protein: 8.3 g/dL — ABNORMAL HIGH (ref 6.5–8.1)

## 2020-08-29 LAB — CBC WITH DIFFERENTIAL/PLATELET
Abs Immature Granulocytes: 0.21 10*3/uL — ABNORMAL HIGH (ref 0.00–0.07)
Basophils Absolute: 0.1 10*3/uL (ref 0.0–0.1)
Basophils Relative: 1 %
Eosinophils Absolute: 0.1 10*3/uL (ref 0.0–0.5)
Eosinophils Relative: 1 %
HCT: 48.5 % (ref 39.0–52.0)
Hemoglobin: 16.2 g/dL (ref 13.0–17.0)
Immature Granulocytes: 2 %
Lymphocytes Relative: 14 %
Lymphs Abs: 1.9 10*3/uL (ref 0.7–4.0)
MCH: 32.2 pg (ref 26.0–34.0)
MCHC: 33.4 g/dL (ref 30.0–36.0)
MCV: 96.4 fL (ref 80.0–100.0)
Monocytes Absolute: 0.8 10*3/uL (ref 0.1–1.0)
Monocytes Relative: 6 %
Neutro Abs: 10.6 10*3/uL — ABNORMAL HIGH (ref 1.7–7.7)
Neutrophils Relative %: 76 %
Platelets: 243 10*3/uL (ref 150–400)
RBC: 5.03 MIL/uL (ref 4.22–5.81)
RDW: 13.2 % (ref 11.5–15.5)
WBC: 13.7 10*3/uL — ABNORMAL HIGH (ref 4.0–10.5)
nRBC: 0 % (ref 0.0–0.2)

## 2020-08-29 LAB — SARS CORONAVIRUS 2 BY RT PCR (HOSPITAL ORDER, PERFORMED IN ~~LOC~~ HOSPITAL LAB): SARS Coronavirus 2: NEGATIVE

## 2020-08-29 MED ORDER — IOHEXOL 300 MG/ML  SOLN
100.0000 mL | Freq: Once | INTRAMUSCULAR | Status: AC | PRN
  Administered 2020-08-29: 100 mL via INTRAVENOUS

## 2020-08-29 MED ORDER — HYDROMORPHONE HCL 1 MG/ML IJ SOLN
1.0000 mg | Freq: Once | INTRAMUSCULAR | Status: AC
  Administered 2020-08-29: 1 mg via INTRAVENOUS
  Filled 2020-08-29: qty 1

## 2020-08-29 NOTE — ED Notes (Signed)
Pt requesting water or ice chips. Instructed can not have ice or water at this time. Pt verbalized understanding.

## 2020-08-29 NOTE — ED Notes (Signed)
Pt c/o 10/10 right shoulder pain and generalized abdominal pain from pelvic to lower chest area. Dr Alvino Chapel at bedside. Pt tearful.

## 2020-08-29 NOTE — Discharge Instructions (Signed)
You are leaving against advice.  With this small splenic laceration this could be life-threatening.  Feel free to return at any time.  Return for worsening lightheadedness dizziness.  Follow-up with orthopedic surgery for your shoulder pain.

## 2020-08-29 NOTE — ED Notes (Signed)
Discussed AMA with patient and risks of leaving with spleen laceration. Pt wife on the phone with him at the time, pt wife states "I am a nurse, I know what AMA means." Pt still wanting to sign out AMA after risks discussed with patient and family. Pt signed Faith disposition.
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
## 2020-08-29 NOTE — ED Notes (Addendum)
Pt with small knot to right side of forehead, dried blood noted on right side of forehead, dried blood noted on left ear, bruising noted to left upper arm, redness noted to right clavicle area, generalized redness across front of his abdomen.
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
## 2020-08-29 NOTE — ED Notes (Signed)
c collar placed on pt

## 2020-08-29 NOTE — ED Notes (Signed)
Shoulder sling placed at bedside. Pt refusing shoulder sling at this time, states will place on in just a minute. Pt on the phone with wife at this time.

## 2020-08-29 NOTE — ED Notes (Signed)
Radiology at bedside

## 2020-08-29 NOTE — ED Notes (Signed)
Pt in CT at this time. Unable to obtain vitals.

## 2020-08-29 NOTE — ED Notes (Signed)
Pt taken to CT.

## 2020-08-30 ENCOUNTER — Telehealth: Payer: Self-pay

## 2020-08-30 NOTE — Telephone Encounter (Signed)
Transition Care Management Unsuccessful Follow-up Telephone Call  Date of discharge and from where:  08/29/20 from [facility]  Attempts:  1st Attempt  Reason for unsuccessful TCM follow-up call:  Left voice message

## 2020-08-31 NOTE — Telephone Encounter (Signed)
Transition Care Management Unsuccessful Follow-up Telephone Call  Date of discharge and from where: 08/29/20 from [facility]  Attempts:  2nd Attempt  Reason for unsuccessful TCM follow-up call:  Left voice message
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
## 2020-09-02 DIAGNOSIS — M25512 Pain in left shoulder: Secondary | ICD-10-CM | POA: Diagnosis not present

## 2020-09-20 DIAGNOSIS — M546 Pain in thoracic spine: Secondary | ICD-10-CM | POA: Diagnosis not present

## 2020-09-20 DIAGNOSIS — M25512 Pain in left shoulder: Secondary | ICD-10-CM | POA: Diagnosis not present

## 2020-12-04 DIAGNOSIS — Z20822 Contact with and (suspected) exposure to covid-19: Secondary | ICD-10-CM | POA: Diagnosis not present

## 2021-04-04 ENCOUNTER — Other Ambulatory Visit: Payer: Self-pay

## 2021-04-04 ENCOUNTER — Encounter: Payer: Self-pay | Admitting: Family Medicine

## 2021-04-04 ENCOUNTER — Ambulatory Visit (INDEPENDENT_AMBULATORY_CARE_PROVIDER_SITE_OTHER): Payer: Medicare HMO | Admitting: Family Medicine

## 2021-04-04 VITALS — BP 119/75 | HR 68 | Temp 97.3°F | Ht 77.0 in | Wt 316.0 lb

## 2021-04-04 DIAGNOSIS — I10 Essential (primary) hypertension: Secondary | ICD-10-CM

## 2021-04-04 DIAGNOSIS — Z01818 Encounter for other preprocedural examination: Secondary | ICD-10-CM

## 2021-04-04 LAB — BAYER DCA HB A1C WAIVED: HB A1C (BAYER DCA - WAIVED): 5.7 % — ABNORMAL HIGH (ref 4.8–5.6)

## 2021-04-04 NOTE — Progress Notes (Signed)
Subjective:  Patient ID: Benjamin Carter, male    DOB: 1980-11-11, 40 y.o.   MRN: [id number]  Patient Care Team: Patient, No Pcp Per (Inactive) as PCP - General (General Practice) Pcp, No System, Provider Not In   Chief Complaint:  Medical Clearance (Dental procedure)   HPI: Benjamin Carter is a 40 y.o. male presenting on 04/04/2021 for Medical Clearance (Dental procedure)   Pt presents today for preoperative clearance for multiple dental extractions under general anesthesia. He is scheduled to have procedure at [facility] and was sent for clearance. He states he has not been taking his blood pressure medications for over a year and his BP has been well controlled. His blood sugar has been elevated in the past but he is not taking any medications. He has chronic back pain and is taking oxycodone for this. He has not been seen by a PCP in over a year.    Relevant past medical, surgical, family, and social history reviewed and updated as indicated.  Allergies and medications reviewed and updated. Data reviewed: Chart in Epic.   Past Medical History:  Diagnosis Date  . Anxiety   . Arthritis   . Bipolar 1 disorder ([place])   . Depression   . History of MRSA infection    left index finger 03-07-2015 w/ positive blood cultures  . Hyperlipidemia   . Left hand pain   . Osteomyelitis of finger of left hand ([place])   . Wears glasses     Past Surgical History:  Procedure Laterality Date  . AMPUTATION Left 03/10/2014   Procedure: Irrigation and Debridement and Revision of Amputation Left Index Finger;  Surgeon: Linna Hoff, MD;  Location: [facility];  Service: Orthopedics;  Laterality: Left;  Wants to go at 5PM  . AMPUTATION Left 10/21/2014   Procedure: LEFT LONG FINGER AMPUTATION REVISION;  Surgeon: Iran Planas, MD;  Location: [facility];  Service: Orthopedics;  Laterality: Left;  . CHOLECYSTECTOMY N/A 12/07/2013   Procedure: LAPAROSCOPIC CHOLECYSTECTOMY;  Surgeon: Jamesetta So, MD;   Location: [facility];  Service: General;  Laterality: N/A;  . GALLBLADDER SURGERY  2015  . I & D EXTREMITY Left 03/07/2014   Procedure: IRRIGATION AND DEBRIDEMENT INDEX FINGER;  Surgeon: Linna Hoff, MD;  Location: [facility];  Service: Orthopedics;  Laterality: Left;  . LUMBAR [place] SURGERY  2004    Social History   Socioeconomic History  . Marital status: Single    Spouse name: Not on file  . Number of children: 1  . Years of education: Not on file  . Highest education level: High school graduate  Occupational History  . Not on file  Tobacco Use  . Smoking status: Every Day    Packs/day: 1.00    Years: 20.00    Pack years: 20.00    Types: Cigarettes  . Smokeless tobacco: Never  Vaping Use  . Vaping Use: Never used  Substance and Sexual Activity  . Alcohol use: Yes    Comment: social infrequently  . Drug use: Yes    Frequency: 4.0 times per week    Types: Marijuana  . Sexual activity: Yes    Birth control/protection: Surgical    Comment: girlfriend had surgery, 83year old girl  Other Topics Concern  . Not on file  Social History Narrative  . Not on file   Social Determinants of Health   Financial Resource Strain: Not on file  Food Insecurity: Not on file  Transportation Needs:  Not on file  Physical Activity: Not on file  Stress: Not on file  Social Connections: Not on file  Intimate Partner Violence: Not on file    Outpatient Encounter Medications as of 04/04/2021  Medication Sig  . albuterol (VENTOLIN HFA) 108 (90 Base) MCG/ACT inhaler Inhale 1-2 puffs into the lungs every 6 (six) hours as needed for shortness of breath.  . benzonatate (TESSALON) 100 MG capsule Take 1 capsule (100 mg total) by mouth 3 (three) times daily as needed for cough.  . cloNIDine (CATAPRES) 0.1 MG tablet Take 1 tablet (0.1 mg total) by mouth 2 (two) times daily.  . hydrOXYzine (VISTARIL) 25 MG capsule Take 1 capsule (25 mg total) by mouth daily as needed. Only for severe anxiety,agitation  .  lisinopril (PRINIVIL,ZESTRIL) 10 MG tablet Take 1 tablet (10 mg total) by mouth daily.  . Multiple Vitamin (MULTIVITAMIN WITH MINERALS) TABS tablet Take 1 tablet by mouth daily.  Marland Kitchen oxyCODONE-acetaminophen (PERCOCET) 10-325 MG tablet oxycodone-acetaminophen 10 mg-325 mg tablet  TK 1 T PO Q 6 H FOR 10 DAYS   No facility-administered encounter medications on file as of 04/04/2021.    Allergies  Allergen Reactions  . Bactrim [Sulfamethoxazole-Trimethoprim]     Caused pancreatitis  . Doxycycline   . Sulfites     Caused elevated liver enzymes  . Toradol [Ketorolac Tromethamine] Rash    Review of Systems  Constitutional:  Negative for activity change, appetite change, chills, diaphoresis, fatigue, fever and unexpected weight change.  HENT:  Positive for dental problem.   Eyes: Negative.  Negative for photophobia and visual disturbance.  Respiratory:  Negative for cough, chest tightness and shortness of breath.   Cardiovascular:  Negative for chest pain, palpitations and leg swelling.  Gastrointestinal:  Negative for abdominal pain, blood in stool, constipation, diarrhea, nausea and vomiting.  Endocrine: Negative.   Genitourinary:  Negative for decreased urine volume, difficulty urinating, dysuria, frequency and urgency.  Musculoskeletal:  Positive for arthralgias and back pain. Negative for myalgias.  Skin: Negative.   Allergic/Immunologic: Negative.   Neurological:  Negative for dizziness, tremors, seizures, syncope, facial asymmetry, speech difficulty, weakness, light-headedness, numbness and headaches.  Hematological: Negative.   Psychiatric/Behavioral:  Negative for confusion, hallucinations, sleep disturbance and suicidal ideas.   All other systems reviewed and are negative.      Objective:  BP 119/75   Pulse 68   Temp (!) 97.3 F (36.3 C)   Ht 6' 5" (1.956 m)   Wt (!) 316 lb (143.3 kg)   SpO2 95%   BMI 37.47 kg/m    Wt Readings from Last 3 Encounters:  04/04/21 (!) 316  lb (143.3 kg)  08/29/20 280 lb (127 kg)  12/31/19 285 lb (129.3 kg)    Physical Exam Vitals and nursing note reviewed.  Constitutional:      General: He is not in acute distress.    Appearance: Normal appearance. He is well-developed and well-groomed. He is morbidly obese. He is not ill-appearing, toxic-appearing or diaphoretic.  HENT:     Head: Normocephalic and atraumatic.     Jaw: There is normal jaw occlusion.     Right Ear: Hearing normal.     Left Ear: Hearing normal.     Nose: Nose normal.     Mouth/Throat:     Lips: Pink.     Mouth: Mucous membranes are moist.     Dentition: Abnormal dentition (missing teeth). Dental caries present.     Tongue: No lesions. Tongue does not deviate  from midline.     Palate: No mass and lesions.     Pharynx: Oropharynx is clear. Uvula midline.     Tonsils: No tonsillar exudate or tonsillar abscesses.  Eyes:     General: Lids are normal.     Extraocular Movements: Extraocular movements intact.     Conjunctiva/sclera: Conjunctivae normal.     Pupils: Pupils are equal, round, and reactive to light.  Neck:     Thyroid: No thyroid mass, thyromegaly or thyroid tenderness.     Vascular: No carotid bruit or JVD.     Trachea: Trachea and phonation normal.  Cardiovascular:     Rate and Rhythm: Normal rate and regular rhythm.     Chest Wall: PMI is not displaced.     Pulses: Normal pulses.     Heart sounds: Normal heart sounds. No murmur heard.   No friction rub. No gallop.  Pulmonary:     Effort: Pulmonary effort is normal. No respiratory distress.     Breath sounds: Normal breath sounds. No wheezing.  Abdominal:     General: Bowel sounds are normal. There is no distension or abdominal bruit.     Palpations: Abdomen is soft. There is no hepatomegaly or splenomegaly.     Tenderness: There is no abdominal tenderness. There is no right CVA tenderness or left CVA tenderness.     Hernia: No hernia is present.  Musculoskeletal:        General:  Normal range of motion.     Cervical back: Normal range of motion and neck supple.     Right lower leg: No edema.     Left lower leg: No edema.  Lymphadenopathy:     Cervical: No cervical adenopathy.  Skin:    General: Skin is warm and dry.     Capillary Refill: Capillary refill takes less than 2 seconds.     Coloration: Skin is not cyanotic, jaundiced or pale.     Findings: No rash.  Neurological:     General: No focal deficit present.     Mental Status: He is alert and oriented to person, place, and time.     Cranial Nerves: Cranial nerves are intact. No cranial nerve deficit.     Sensory: Sensation is intact. No sensory deficit.     Motor: Motor function is intact. No weakness.     Coordination: Coordination is intact. Coordination normal.     Gait: Gait is intact. Gait normal.     Deep Tendon Reflexes: Reflexes are normal and symmetric. Reflexes normal.  Psychiatric:        Attention and Perception: Attention and perception normal.        Mood and Affect: Mood and affect normal.        Speech: Speech normal.        Behavior: Behavior normal. Behavior is cooperative.        Thought Content: Thought content normal.        Cognition and Memory: Cognition and memory normal.        Judgment: Judgment normal.    Results for orders placed or performed during the hospital encounter of 08/29/20  SARS Coronavirus 2 by RT PCR (hospital order, performed in [facility] hospital lab) Nasopharyngeal Nasopharyngeal Swab   Specimen: Nasopharyngeal Swab  Result Value Ref Range   SARS Coronavirus 2 NEGATIVE NEGATIVE  Comprehensive metabolic panel  Result Value Ref Range   Sodium 135 135 - 145 mmol/L   Potassium 3.9 3.5 - 5.1 mmol/L  Chloride 102 98 - 111 mmol/L   CO2 25 22 - 32 mmol/L   Glucose, Bld 121 (H) 70 - 99 mg/dL   BUN 11 6 - 20 mg/dL   Creatinine, Ser 1.07 0.61 - 1.24 mg/dL   Calcium 9.3 8.9 - 10.3 mg/dL   Total Protein 8.3 (H) 6.5 - 8.1 g/dL   Albumin 4.6 3.5 - 5.0 g/dL    AST 44 (H) 15 - 41 U/L   ALT 35 0 - 44 U/L   Alkaline Phosphatase 64 38 - 126 U/L   Total Bilirubin 1.3 (H) 0.3 - 1.2 mg/dL   GFR, Estimated >60 >60 mL/min   Anion gap 8 5 - 15  CBC with Differential  Result Value Ref Range   WBC 13.7 (H) 4.0 - 10.5 K/uL   RBC 5.03 4.22 - 5.81 MIL/uL   Hemoglobin 16.2 13.0 - 17.0 g/dL   HCT 48.5 39.0 - 52.0 %   MCV 96.4 80.0 - 100.0 fL   MCH 32.2 26.0 - 34.0 pg   MCHC 33.4 30.0 - 36.0 g/dL   RDW 13.2 11.5 - 15.5 %   Platelets 243 150 - 400 K/uL   nRBC 0.0 0.0 - 0.2 %   Neutrophils Relative % 76 %   Neutro Abs 10.6 (H) 1.7 - 7.7 K/uL   Lymphocytes Relative 14 %   Lymphs Abs 1.9 0.7 - 4.0 K/uL   Monocytes Relative 6 %   Monocytes Absolute 0.8 0.1 - 1.0 K/uL   Eosinophils Relative 1 %   Eosinophils Absolute 0.1 0.0 - 0.5 K/uL   Basophils Relative 1 %   Basophils Absolute 0.1 0.0 - 0.1 K/uL   Immature Granulocytes 2 %   Abs Immature Granulocytes 0.21 (H) 0.00 - 0.07 K/uL  I-stat chem 8, ED (not at [facility] or [facility])  Result Value Ref Range   Sodium 140 135 - 145 mmol/L   Potassium 4.1 3.5 - 5.1 mmol/L   Chloride 103 98 - 111 mmol/L   BUN 10 6 - 20 mg/dL   Creatinine, Ser 1.00 0.61 - 1.24 mg/dL   Glucose, Bld 117 (H) 70 - 99 mg/dL   Calcium, Ion 1.20 1.15 - 1.40 mmol/L   TCO2 26 22 - 32 mmol/L   Hemoglobin 17.7 (H) 13.0 - 17.0 g/dL   HCT 52.0 39.0 - 52.0 %  Sample to Blood Bank  Result Value Ref Range   Blood Bank Specimen SAMPLE AVAILABLE FOR TESTING    Sample Expiration      09/01/2020,2359 Performed at [facility], [address] [place], [place] [address]      EKG: SR 65, PR 190 ms, QT 394 ms, no acute ST-T changes or ectopy, no changes from prior EKG. Monia Pouch, FNP-C  Pertinent labs & imaging results that were available during my care of the patient were reviewed by me and considered in my medical decision making.  Assessment & Plan:  [place] was seen today for medical clearance.  Diagnoses and all orders for this  visit:  Preoperative clearance Pt is considered a moderate risk for general anesthesia. EKG unremarkable in office. CXR 08/2020 unremarkable. Labs ordered today.  -     EKG 12-Lead -     CBC with Differential/Platelet -     CMP14+EGFR -     Bayer DCA Hb A1c Waived  Morbid obesity (HCC) Diet and exercise encouraged. Labs pending.  -     CBC with Differential/Platelet -     CMP14+EGFR -  Lipid panel -     Thyroid Panel With TSH -     Bayer DCA Hb A1c Waived  Primary hypertension Normal BP in office today without medications. No hypertensive changes noted on EKG. Will check below labs. Continue to monitor BP and report any persistent high readings as medications may need to be restarted.  -     EKG 12-Lead -     CBC with Differential/Platelet -     CMP14+EGFR -     Lipid panel -     Thyroid Panel With TSH -     Bayer DCA Hb A1c Waived    Continue all other maintenance medications.  Follow up plan: Return in 3 months (on 07/04/2021), or if symptoms worsen or fail to improve, for PCP.   Continue healthy lifestyle choices, including diet (rich in fruits, vegetables, and lean proteins, and low in salt and simple carbohydrates) and exercise (at least 30 minutes of moderate physical activity daily).   The above assessment and management plan was discussed with the patient. The patient verbalized understanding of and has agreed to the management plan. Patient is aware to call the clinic if they develop any new symptoms or if symptoms persist or worsen. Patient is aware when to return to the clinic for a follow-up visit. Patient educated on when it is appropriate to go to the emergency department.   Monia Pouch, FNP-C [facility] [phone]

## 2021-04-05 LAB — CBC WITH DIFFERENTIAL/PLATELET
Basophils Absolute: 0 10*3/uL (ref 0.0–0.2)
Basos: 0 %
EOS (ABSOLUTE): 0.2 10*3/uL (ref 0.0–0.4)
Eos: 2 %
Hematocrit: 45.7 % (ref 37.5–51.0)
Hemoglobin: 15.7 g/dL (ref 13.0–17.7)
Immature Grans (Abs): 0 10*3/uL (ref 0.0–0.1)
Immature Granulocytes: 0 %
Lymphocytes Absolute: 3.1 10*3/uL (ref 0.7–3.1)
Lymphs: 34 %
MCH: 31.9 pg (ref 26.6–33.0)
MCHC: 34.4 g/dL (ref 31.5–35.7)
MCV: 93 fL (ref 79–97)
Monocytes Absolute: 0.7 10*3/uL (ref 0.1–0.9)
Monocytes: 8 %
Neutrophils Absolute: 5.1 10*3/uL (ref 1.4–7.0)
Neutrophils: 56 %
Platelets: 238 10*3/uL (ref 150–450)
RBC: 4.92 x10E6/uL (ref 4.14–5.80)
RDW: 11.9 % (ref 11.6–15.4)
WBC: 9.1 10*3/uL (ref 3.4–10.8)

## 2021-04-05 LAB — LIPID PANEL
Chol/HDL Ratio: 5 ratio (ref 0.0–5.0)
Cholesterol, Total: 203 mg/dL — ABNORMAL HIGH (ref 100–199)
HDL: 41 mg/dL (ref >39)
LDL Chol Calc (NIH): 126 mg/dL — ABNORMAL HIGH (ref 0–99)
Triglycerides: 204 mg/dL — ABNORMAL HIGH (ref 0–149)
VLDL Cholesterol Cal: 36 mg/dL (ref 5–40)

## 2021-04-05 LAB — CMP14+EGFR
ALT: 23 IU/L (ref 0–44)
AST: 25 IU/L (ref 0–40)
Albumin/Globulin Ratio: 1.6 (ref 1.2–2.2)
Albumin: 4.4 g/dL (ref 4.0–5.0)
Alkaline Phosphatase: 66 IU/L (ref 44–121)
BUN/Creatinine Ratio: 7 — ABNORMAL LOW (ref 9–20)
BUN: 9 mg/dL (ref 6–24)
Bilirubin Total: 0.8 mg/dL (ref 0.0–1.2)
CO2: 25 mmol/L (ref 20–29)
Calcium: 9.4 mg/dL (ref 8.7–10.2)
Chloride: 101 mmol/L (ref 96–106)
Creatinine, Ser: 1.24 mg/dL (ref 0.76–1.27)
Globulin, Total: 2.7 g/dL (ref 1.5–4.5)
Glucose: 90 mg/dL (ref 65–99)
Potassium: 4.5 mmol/L (ref 3.5–5.2)
Sodium: 139 mmol/L (ref 134–144)
Total Protein: 7.1 g/dL (ref 6.0–8.5)
eGFR: 75 mL/min/{1.73_m2} (ref >59)

## 2021-04-05 LAB — THYROID PANEL WITH TSH
Free Thyroxine Index: 1.6 (ref 1.2–4.9)
T3 Uptake Ratio: 25 % (ref 24–39)
T4, Total: 6.4 ug/dL (ref 4.5–12.0)
TSH: 1.89 u[IU]/mL (ref 0.450–4.500)

## 2021-04-07 ENCOUNTER — Ambulatory Visit (INDEPENDENT_AMBULATORY_CARE_PROVIDER_SITE_OTHER): Payer: Medicare HMO | Admitting: Podiatry

## 2021-04-07 ENCOUNTER — Ambulatory Visit (INDEPENDENT_AMBULATORY_CARE_PROVIDER_SITE_OTHER): Payer: Medicare HMO

## 2021-04-07 ENCOUNTER — Encounter: Payer: Self-pay | Admitting: Podiatry

## 2021-04-07 ENCOUNTER — Telehealth: Payer: Self-pay | Admitting: Family Medicine

## 2021-04-07 ENCOUNTER — Other Ambulatory Visit: Payer: Self-pay

## 2021-04-07 DIAGNOSIS — M25872 Other specified joint disorders, left ankle and foot: Secondary | ICD-10-CM

## 2021-04-07 NOTE — Telephone Encounter (Signed)
Pt called - LM - will need face to face and DM foot check for the RX

## 2021-04-07 NOTE — Telephone Encounter (Signed)
Pt called to let PCP know that he was told by one of the specialists that he sees that if we had it listed that he was either pre-diabetic or diabetic, then his insurance would pay for him to get some shoes to help with his feet condition. Pt says PCP told him at his last visit that he was pre-diabetic.  Please advise and call patient.

## 2021-04-07 NOTE — Progress Notes (Signed)
Subjective:  Patient ID: KAS SHYNE, male    DOB: 01/08/1981,  MRN: [id number]  Chief Complaint  Patient presents with   Foot Pain    Pain in the ball of foot     40 y.o. male presents with the above complaint.  Patient presents with complaint of left submetatarsal 1 pain/sesamoiditis pain in the ball of the foot.  Patient states is very painful to touch painful to walk and he has been working for decades and still toe boots.  He has a high arch foot structure he denies any other acute complaints he has not seen anyone else prior to seeing me.  He would like to discuss treatment options for this.  It hurts with ambulation pain is dull achy in nature is 7 out of 10.   Review of Systems: Negative except as noted in the HPI. Denies N/V/F/Ch.  Past Medical History:  Diagnosis Date   Anxiety    Arthritis    Bipolar 1 disorder ([place])    Depression    History of MRSA infection    left index finger 03-07-2015 w/ positive blood cultures   Hyperlipidemia    Left hand pain    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)    Wears glasses     Current Outpatient Medications:    oxyCODONE-acetaminophen (PERCOCET) 10-325 MG tablet, oxycodone-acetaminophen 10 mg-325 mg tablet  TK 1 T PO Q 6 H FOR 10 DAYS, Disp: , Rfl:   Social History   Tobacco Use  Smoking Status Every Day   Packs/day: 1.00   Years: 20.00   Pack years: 20.00   Types: Cigarettes  Smokeless Tobacco Never    Allergies  Allergen Reactions   Bactrim [Sulfamethoxazole-Trimethoprim]     Caused pancreatitis   Doxycycline    Sulfites     Caused elevated liver enzymes   Toradol [Ketorolac Tromethamine] Rash   Objective:  There were no vitals filed for this visit. There is no height or weight on file to calculate BMI. Constitutional Well developed. Well nourished.  Vascular Dorsalis pedis pulses palpable bilaterally. Posterior tibial pulses palpable bilaterally. Capillary refill normal to all digits.  No cyanosis  or clubbing noted. Pedal hair growth normal.  Neurologic Normal speech. Oriented to person, place, and time. Epicritic sensation to light touch grossly present bilaterally.  Dermatologic Nails well groomed and normal in appearance. No open wounds. No skin lesions.  Orthopedic: Pain on palpation to the left sesamoidal complex.  No pain with range of motion of the first MPJ joint.  No deep intra-articular pain noted.  No flexor or extensor tendinitis noted.   Radiographs: 3 views of skeletally mature adult left foot: Pes cavus foot structure noted.  No bony abnormalities identified.  No stress fracture noted.  No actual fracture noted. Assessment:   1. Sesamoiditis of left foot    Plan:  Patient was evaluated and treated and all questions answered.  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  Given the amount of pain that he is having I believe he will benefit from a steroid injection help decrease acute inflammatory component associate with pain.  Patient agrees with plan like to proceed with steroid injection. -A steroid injection was performed at left sesamoidal complex using 1% plain Lidocaine and 10 mg of Kenalog. This was well tolerated. -We will discuss offloading options during next clinical visit.   No follow-ups on  file.

## 2021-04-11 ENCOUNTER — Encounter: Payer: Self-pay | Admitting: Family Medicine

## 2021-04-11 ENCOUNTER — Other Ambulatory Visit: Payer: Self-pay

## 2021-04-11 ENCOUNTER — Ambulatory Visit (INDEPENDENT_AMBULATORY_CARE_PROVIDER_SITE_OTHER): Payer: Medicare HMO | Admitting: Family Medicine

## 2021-04-11 VITALS — BP 118/72 | HR 70 | Temp 97.6°F | Ht 78.0 in | Wt 318.0 lb

## 2021-04-11 DIAGNOSIS — M25872 Other specified joint disorders, left ankle and foot: Secondary | ICD-10-CM | POA: Insufficient documentation

## 2021-04-11 DIAGNOSIS — R7303 Prediabetes: Secondary | ICD-10-CM | POA: Diagnosis not present

## 2021-04-11 NOTE — Progress Notes (Signed)
Subjective:  Patient ID: Benjamin Carter, male    DOB: 1980/12/26, 40 y.o.   MRN: [id number]  Patient Care Team: Baruch Gouty, FNP as PCP - General (Family Medicine)   Chief Complaint:  Diabetes (Foot check, diabetic shoes)   HPI: Benjamin Carter is a 40 y.o. male presenting on 04/11/2021 for Diabetes (Foot check, diabetic shoes)   Pt presents today for evaluation of pain and abnormality of let foot. He is followed by Dr. Posey Pronto and was seen 04/07/2021, diagnosed with sesamoiditis and received steroid injection. He has prediabetes, last A1C 5.7, has not been on medications for years, diet controlled.    Relevant past medical, surgical, family, and social history reviewed and updated as indicated.  Allergies and medications reviewed and updated. Data reviewed: Chart in Epic.   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)    Wears glasses     Past Surgical History:  Procedure Laterality Date   AMPUTATION Left 03/10/2014   Procedure: Irrigation and Debridement and Revision of Amputation Left Index Finger;  Surgeon: Linna Hoff, MD;  Location: [facility];  Service: Orthopedics;  Laterality: Left;  Wants to go at [place] Left 10/21/2014   Procedure: LEFT LONG FINGER AMPUTATION REVISION;  Surgeon: Iran Planas, MD;  Location: [facility];  Service: Orthopedics;  Laterality: Left;   CHOLECYSTECTOMY N/A 12/07/2013   Procedure: LAPAROSCOPIC CHOLECYSTECTOMY;  Surgeon: Jamesetta So, MD;  Location: [facility];  Service: General;  Laterality: N/A;   GALLBLADDER SURGERY  2015   I & D EXTREMITY Left 03/07/2014   Procedure: IRRIGATION AND DEBRIDEMENT INDEX FINGER;  Surgeon: Linna Hoff, MD;  Location: [facility];  Service: Orthopedics;  Laterality: Left;   [place] SURGERY  2004    Social History   Socioeconomic History   Marital status: Single    Spouse name: Not on file   Number of children: 1   Years of education: Not on file   Highest education level: High school graduate  Occupational History   Not on file  Tobacco Use   Smoking status: Every Day    Packs/day: 1.00    Years: 20.00    Pack years: 20.00    Types: Cigarettes   Smokeless tobacco: Never  Vaping Use   Vaping Use: Never used  Substance and Sexual Activity   Alcohol use: Yes    Comment: social infrequently   Drug use: Yes    Frequency: 4.0 times per week    Types: Marijuana   Sexual activity: Yes    Birth control/protection: Surgical    Comment: girlfriend had surgery, 33year old girl  Other Topics Concern   Not on file  Social History Narrative   Not on file   Social Determinants of Health   Financial Resource Strain: Not on file  Food Insecurity: Not on file  Transportation Needs: Not on file  Physical Activity: Not on file  Stress: Not on file  Social Connections: Not on file  Intimate Partner Violence: Not on file    Outpatient Encounter Medications as of 04/11/2021  Medication Sig   oxyCODONE-acetaminophen (PERCOCET) 10-325 MG tablet oxycodone-acetaminophen 10 mg-325  mg tablet  TK 1 T PO Q 6 H FOR 10 DAYS   No facility-administered encounter medications on file as of 04/11/2021.    Allergies  Allergen Reactions   Bactrim [Sulfamethoxazole-Trimethoprim]     Caused pancreatitis   Doxycycline    Sulfites     Caused elevated liver enzymes   Toradol [Ketorolac Tromethamine] Rash    Review of Systems  Constitutional:  Negative for activity change, appetite change, chills, diaphoresis, fever and unexpected weight change.  HENT: Negative.    Eyes: Negative.   Respiratory:  Negative for cough, chest tightness and shortness of breath.   Cardiovascular:  Negative for palpitations and leg swelling.  Gastrointestinal:  Negative for abdominal pain,  blood in stool, constipation, diarrhea, nausea and vomiting.  Endocrine: Negative.   Genitourinary:  Negative for decreased urine volume, difficulty urinating, dysuria, frequency and urgency.  Musculoskeletal:  Positive for arthralgias, back pain and myalgias. Negative for gait problem, joint swelling, neck pain and neck stiffness.  Skin: Negative.   Allergic/Immunologic: Negative.   Neurological:  Negative for syncope, facial asymmetry, light-headedness and numbness.  Hematological: Negative.   Psychiatric/Behavioral:  Negative for hallucinations, sleep disturbance and suicidal ideas.   All other systems reviewed and are negative.      Objective:  BP 118/72   Pulse 70   Temp 97.6 F (36.4 C)   Ht _0  (1.981 m)   Wt (!) 318 lb (144.2 kg)   SpO2 93%   BMI 36.75 kg/m    Wt Readings from Last 3 Encounters:  04/11/21 (!) 318 lb (144.2 kg)  04/04/21 (!) 316 lb (143.3 kg)  08/29/20 280 lb (127 kg)    Physical Exam Vitals and nursing note reviewed.  Constitutional:      General: He is not in acute distress.    Appearance: Normal appearance. He is well-developed and well-groomed. He is obese. He is not ill-appearing, toxic-appearing or diaphoretic.  HENT:     Head: Normocephalic and atraumatic.     Jaw: There is normal jaw occlusion.     Right Ear: Hearing normal.     Left Ear: Hearing normal.     Nose: Nose normal.     Mouth/Throat:     Lips: Pink.     Mouth: Mucous membranes are moist.     Pharynx: Oropharynx is clear. Uvula midline.  Eyes:     General: Lids are normal.     Extraocular Movements: Extraocular movements intact.     Conjunctiva/sclera: Conjunctivae normal.     Pupils: Pupils are equal, round, and reactive to light.  Neck:     Thyroid: No thyroid mass, thyromegaly or thyroid tenderness.     Vascular: No carotid bruit or JVD.     Trachea: Trachea and phonation normal.  Cardiovascular:     Rate and Rhythm: Normal rate and regular rhythm.     Chest Wall:  PMI is not displaced.     Pulses: Normal pulses.          Dorsalis pedis pulses are 2+ on the right side and 2+ on the left side.       Posterior tibial pulses are 2+ on the right side and 2+ on the left side.     Heart sounds: Normal heart sounds. No murmur heard.   No friction rub. No gallop.  Pulmonary:     Effort: Pulmonary effort is normal. No respiratory distress.     Breath sounds: Normal breath sounds. No wheezing.  Abdominal:  General: Bowel sounds are normal. There is no distension or abdominal bruit.     Palpations: Abdomen is soft. There is no hepatomegaly or splenomegaly.     Tenderness: There is no abdominal tenderness. There is no right CVA tenderness or left CVA tenderness.     Hernia: No hernia is present.  Musculoskeletal:        General: Normal range of motion.     Cervical back: Normal range of motion and neck supple.     Right lower leg: No edema.     Left lower leg: No edema.     Left foot: Deformity and bunion present.  Feet:     Right foot:     Protective Sensation: 10 sites tested.  10 sites sensed.     Skin integrity: Callus present.     Toenail Condition: Right toenails are abnormally thick.     Left foot:     Protective Sensation: 10 sites tested.  10 sites sensed.     Skin integrity: Callus and dry skin present.     Toenail Condition: Left toenails are abnormally thick.  Lymphadenopathy:     Cervical: No cervical adenopathy.  Skin:    General: Skin is warm and dry.     Capillary Refill: Capillary refill takes less than 2 seconds.     Coloration: Skin is not cyanotic, jaundiced or pale.     Findings: No rash.  Neurological:     General: No focal deficit present.     Mental Status: He is alert and oriented to person, place, and time.     Cranial Nerves: Cranial nerves are intact.     Sensory: Sensation is intact.     Motor: Motor function is intact.     Coordination: Coordination is intact.     Gait: Gait is intact.     Deep Tendon Reflexes:  Reflexes are normal and symmetric.  Psychiatric:        Attention and Perception: Attention and perception normal.        Mood and Affect: Mood and affect normal.        Speech: Speech normal.        Behavior: Behavior normal. Behavior is cooperative.        Thought Content: Thought content normal.        Cognition and Memory: Cognition and memory normal.        Judgment: Judgment normal.    Results for orders placed or performed in visit on 04/04/21  CBC with Differential/Platelet  Result Value Ref Range   WBC 9.1 3.4 - 10.8 x10E3/uL   RBC 4.92 4.14 - 5.80 x10E6/uL   Hemoglobin 15.7 13.0 - 17.7 g/dL   Hematocrit 45.7 37.5 - 51.0 %   MCV 93 79 - 97 fL   MCH 31.9 26.6 - 33.0 pg   MCHC 34.4 31.5 - 35.7 g/dL   RDW 11.9 11.6 - 15.4 %   Platelets 238 150 - 450 x10E3/uL   Neutrophils 56 Not Estab. %   Lymphs 34 Not Estab. %   Monocytes 8 Not Estab. %   Eos 2 Not Estab. %   Basos 0 Not Estab. %   Neutrophils Absolute 5.1 1.4 - 7.0 x10E3/uL   Lymphocytes Absolute 3.1 0.7 - 3.1 x10E3/uL   Monocytes Absolute 0.7 0.1 - 0.9 x10E3/uL   EOS (ABSOLUTE) 0.2 0.0 - 0.4 x10E3/uL   Basophils Absolute 0.0 0.0 - 0.2 x10E3/uL   Immature Granulocytes 0 Not Estab. %  Immature Grans (Abs) 0.0 0.0 - 0.1 x10E3/uL  CMP14+EGFR  Result Value Ref Range   Glucose 90 65 - 99 mg/dL   BUN 9 6 - 24 mg/dL   Creatinine, Ser 1.24 0.76 - 1.27 mg/dL   eGFR 75 >59 mL/min/1.73   BUN/Creatinine Ratio 7 (L) 9 - 20   Sodium 139 134 - 144 mmol/L   Potassium 4.5 3.5 - 5.2 mmol/L   Chloride 101 96 - 106 mmol/L   CO2 25 20 - 29 mmol/L   Calcium 9.4 8.7 - 10.2 mg/dL   Total Protein 7.1 6.0 - 8.5 g/dL   Albumin 4.4 4.0 - 5.0 g/dL   Globulin, Total 2.7 1.5 - 4.5 g/dL   Albumin/Globulin Ratio 1.6 1.2 - 2.2   Bilirubin Total 0.8 0.0 - 1.2 mg/dL   Alkaline Phosphatase 66 44 - 121 IU/L   AST 25 0 - 40 IU/L   ALT 23 0 - 44 IU/L  Lipid panel  Result Value Ref Range   Cholesterol, Total 203 (H) 100 - 199 mg/dL    Triglycerides 204 (H) 0 - 149 mg/dL   HDL 41 >39 mg/dL   VLDL Cholesterol Cal 36 5 - 40 mg/dL   LDL Chol Calc (NIH) 126 (H) 0 - 99 mg/dL   Chol/HDL Ratio 5.0 0.0 - 5.0 ratio  Thyroid Panel With TSH  Result Value Ref Range   TSH 1.890 0.450 - 4.500 uIU/mL   T4, Total 6.4 4.5 - 12.0 ug/dL   T3 Uptake Ratio 25 24 - 39 %   Free Thyroxine Index 1.6 1.2 - 4.9  Bayer DCA Hb A1c Waived  Result Value Ref Range   HB A1C (BAYER DCA - WAIVED) 5.7 (H) 4.8 - 5.6 %       Pertinent labs & imaging results that were available during my care of the patient were reviewed by me and considered in my medical decision making.  Assessment & Plan:  Devian was seen today for diabetes.  Diagnoses and all orders for this visit:  Prediabetes A1C 5.7. Diet and exercise discussed in detail. Pt will benefit from diabetic shoes as he does have high arches, calluses, and sesamoiditis. Will place order today.  -     For home use only DME Other see comment  Sesamoiditis of left foot Followed by Dr. Posey Pronto on a regular basis. Will write for diabetic shoes.  -     For home use only DME Other see comment    Continue all other maintenance medications.  Follow up plan: Return in about 3 months (around 07/11/2021), or if symptoms worsen or fail to improve.   Continue healthy lifestyle choices, including diet (rich in fruits, vegetables, and lean proteins, and low in salt and simple carbohydrates) and exercise (at least 30 minutes of moderate physical activity daily).   The above assessment and management plan was discussed with the patient. The patient verbalized understanding of and has agreed to the management plan. Patient is aware to call the clinic if they develop any new symptoms or if symptoms persist or worsen. Patient is aware when to return to the clinic for a follow-up visit. Patient educated on when it is appropriate to go to the emergency department.   Monia Pouch, FNP-C [facility] [phone]

## 2021-04-29 ENCOUNTER — Ambulatory Visit (INDEPENDENT_AMBULATORY_CARE_PROVIDER_SITE_OTHER): Payer: Medicare HMO

## 2021-04-29 ENCOUNTER — Encounter: Payer: Self-pay | Admitting: Emergency Medicine

## 2021-04-29 ENCOUNTER — Ambulatory Visit
Admission: EM | Admit: 2021-04-29 | Discharge: 2021-04-29 | Disposition: A | Discharge status: Home / Self Care | Payer: Medicaid Other | Attending: Family Medicine | Admitting: Family Medicine

## 2021-04-29 ENCOUNTER — Other Ambulatory Visit: Payer: Self-pay

## 2021-04-29 DIAGNOSIS — S62661B Nondisplaced fracture of distal phalanx of left index finger, initial encounter for open fracture: Secondary | ICD-10-CM

## 2021-04-29 DIAGNOSIS — S61210A Laceration without foreign body of right index finger without damage to nail, initial encounter: Secondary | ICD-10-CM

## 2021-04-29 HISTORY — DX: Personal history of other specified conditions: Z87.898

## 2021-04-29 MED ORDER — TETANUS-DIPHTH-ACELL PERTUSSIS 5-2.5-18.5 LF-MCG/0.5 IM SUSY
0.5000 mL | PREFILLED_SYRINGE | Freq: Once | INTRAMUSCULAR | Status: AC
  Administered 2021-04-29: 0.5 mL via INTRAMUSCULAR

## 2021-04-29 NOTE — ED Triage Notes (Addendum)
Patient c/o laceration to LFT hand "pointer finger" that happened around 1230 today.   Patient was using a wood splitter.   Bleeding is currently controlled.   Patient reports having a tetanus shot within 5 year, this Probation officer can't find any documentation in Epic.   History of prediabetes.  History of MRSA.

## 2021-04-29 NOTE — Discharge Instructions (Addendum)
Take the Keflex you have twice daily for one week.

## 2021-05-01 NOTE — ED Provider Notes (Signed)
[facility]   [id number] 04/29/21 Arrival Time: 7169  ASSESSMENT & PLAN:  1. Laceration of right index finger without foreign body without damage to nail, initial encounter   2. Nondisplaced fracture of distal phalanx of left index finger, initial encounter for open fracture     Meds ordered this encounter  Medications   Tdap (BOOSTRIX) injection 0.5 mL    Procedure: Verbal consent obtained. Patient provided with risks and alternatives to the procedure. Wound copiously irrigated with NS then cleansed with betadine. Local anesthesia: Lidocaine 1% without epinephrine. Wound carefully explored. No foreign body, tendon injury, or nonviable tissue were noted. Using sterile technique, 2 interrupted 5-0 Prolene sutures were placed to reapproximate the wound. Procedure tolerated well. No complications. Minimal bleeding. Advised to look for and return for any signs of infection such as redness, swelling, discharge, or worsening pain. Return for suture removal in 7 days.  Discussed open fracture. Has Keflex 500mg  tablets at home; to use BID for one week.    Discharge Instructions      Take the Keflex you have twice daily for one week.    Reviewed expectations re: course of current medical issues. Questions answered. Outlined signs and symptoms indicating need for more acute intervention. Patient verbalized understanding. After Visit Summary given.   SUBJECTIVE:  Benjamin Carter is a 40 y.o. male who presents with a laceration of his LEFT index finger; today; caught in wood splitter without glove; immed pain. Washed well. No extremity sensation changes or weakness.   Td UTD: Unknown.  Health Maintenance Due  Topic Date Due   COVID-19 Vaccine (1) Never done    OBJECTIVE:  Vitals:   04/29/21 1449  BP: 124/83  Pulse: 71  Resp: 16  Temp: 97.9 F (36.6 C)  TempSrc: Oral  SpO2: 96%     General appearance: alert; no distress Skin: linear laceration of inner L 2nd  finger; size: approx 1 cm; clean wound edges, no foreign bodies; without active bleeding; L 2nd finger with FROM, normal distal sensation; missing tip of finger from old injury Psychological: alert and cooperative; normal mood and affect    Allergies  Allergen Reactions   Bactrim [Sulfamethoxazole-Trimethoprim]     Caused pancreatitis   Doxycycline    Sulfites     Caused elevated liver enzymes   Toradol [Ketorolac Tromethamine] Rash    Past Medical History:  Diagnosis Date   Anxiety    Arthritis    Bipolar 1 disorder (HCC)    Depression    History of MRSA infection    left index finger 03-07-2015 w/ positive blood cultures   History of prediabetes    Hyperlipidemia    Left hand pain    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)    Wears glasses    Social History   Socioeconomic History   Marital status: Single    Spouse name: Not on file   Number of children: 1   Years of education: Not on file   Highest education level: High school graduate  Occupational History   Not on file  Tobacco Use   Smoking status: Every Day    Packs/day: 1.00    Years: 20.00    Pack years: 20.00    Types: Cigarettes   Smokeless tobacco: Never  Vaping Use   Vaping Use: Never used  Substance and Sexual Activity   Alcohol use: Yes    Comment: social infrequently   Drug use: Yes    Frequency: 4.0  times per week    Types: Marijuana   Sexual activity: Yes    Birth control/protection: Surgical    Comment: girlfriend had surgery, 58year old girl  Other Topics Concern   Not on file  Social History Narrative   Not on file   Social Determinants of Health   Financial Resource Strain: Not on file  Food Insecurity: Not on file  Transportation Needs: Not on file  Physical Activity: Not on file  Stress: Not on file  Social Connections: Not on file          Vanessa Kick, MD 05/01/21 1157

## 2021-05-05 ENCOUNTER — Ambulatory Visit (INDEPENDENT_AMBULATORY_CARE_PROVIDER_SITE_OTHER): Payer: Medicare HMO | Admitting: Podiatry

## 2021-05-05 ENCOUNTER — Other Ambulatory Visit: Payer: Self-pay

## 2021-05-05 DIAGNOSIS — Q667 Congenital pes cavus, unspecified foot: Secondary | ICD-10-CM

## 2021-05-05 DIAGNOSIS — E119 Type 2 diabetes mellitus without complications: Secondary | ICD-10-CM

## 2021-05-05 DIAGNOSIS — M25872 Other specified joint disorders, left ankle and foot: Secondary | ICD-10-CM

## 2021-05-05 MED ORDER — MUPIROCIN CALCIUM 2 % EX CREA: 1.0000 "application " | TOPICAL_CREAM | Freq: Two times a day (BID) | CUTANEOUS | 0 refills | Status: AC

## 2021-05-10 ENCOUNTER — Encounter: Payer: Self-pay | Admitting: Podiatry

## 2021-05-10 NOTE — Progress Notes (Signed)
Subjective:  Patient ID: Benjamin Carter, male    DOB: 1981-01-15,  MRN: [id number]  Chief Complaint  Patient presents with   Foot Pain    Pt states injection effective and is experiencing less pain.    40 y.o. male presents with the above complaint.  Patient presents with complaint of left submetatarsal 1 pain/sesamoiditis pain in the ball of the foot.  He states the injection helped considerably.  He had immediate relief and lasted for few weeks is starting to come back again.  He would like to discuss other treatment options as the pain is much less.  He is about 50 to 60% improved.   Review of Systems: Negative except as noted in the HPI. Denies N/V/F/Ch.  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)    Wears glasses     Current Outpatient Medications:    clindamycin (CLEOCIN) 150 MG capsule, Take 300 mg by mouth 3 (three) times daily., Disp: , Rfl:    mupirocin cream (BACTROBAN) 2 %, Apply 1 application topically 2 (two) times daily., Disp: 15 g, Rfl: 0   oxyCODONE-acetaminophen (PERCOCET) 10-325 MG tablet, oxycodone-acetaminophen 10 mg-325 mg tablet  TK 1 T PO Q 6 H FOR 10 DAYS, Disp: , Rfl:   Social History   Tobacco Use  Smoking Status Every Day   Packs/day: 1.00   Years: 20.00   Pack years: 20.00   Types: Cigarettes  Smokeless Tobacco Never    Allergies  Allergen Reactions   Bactrim [Sulfamethoxazole-Trimethoprim]     Caused pancreatitis   Doxycycline    Sulfites     Caused elevated liver enzymes   Toradol [Ketorolac Tromethamine] Rash   Objective:  There were no vitals filed for this visit. There is no height or weight on file to calculate BMI. Constitutional Well developed. Well nourished.   Vascular Dorsalis pedis pulses palpable bilaterally. Posterior tibial pulses palpable bilaterally. Capillary refill normal to all digits.  No cyanosis or clubbing noted. Pedal hair growth normal.  Neurologic Normal speech. Oriented to person, place, and time. Epicritic sensation to light touch grossly present bilaterally.  Dermatologic Nails well groomed and normal in appearance. No open wounds. No skin lesions.  Orthopedic: Pain on palpation to the left sesamoidal complex.  No pain with range of motion of the first MPJ joint.  No deep intra-articular pain noted.  No flexor or extensor tendinitis noted.   Radiographs: 3 views of skeletally mature adult left foot: Pes cavus foot structure noted.  No bony abnormalities identified.  No stress fracture noted.  No actual fracture noted. Assessment:   1. Sesamoiditis of left foot   2. Pes cavus   3. Type 2 diabetes, diet controlled ([place])     Plan:  Patient was evaluated and treated and all questions answered.  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  Given the amount of pain that he is having I believe he will benefit from a steroid injection help decrease acute inflammatory component associate with pain.  Patient agrees with plan like to proceed with steroid  injection. -A second steroid injection was performed at left sesamoidal complex using 1% plain Lidocaine and 10 mg of Kenalog. This was well tolerated. -Given the foot deformity I believe patient will benefit from diabetic shoes.  He will be scheduled to see EJ to get diabetic shoes.   No follow-ups on file.

## 2021-05-11 ENCOUNTER — Telehealth: Payer: Self-pay | Admitting: *Deleted

## 2021-05-11 ENCOUNTER — Other Ambulatory Visit: Payer: Medicare HMO

## 2021-05-30 NOTE — H&P (Signed)
  Patient referred by general dentist  for extraction all teeth except 22 and 27.teeth  CC: Bad teeth.  Past Medical History:  High Blood Pressure, Smoker, Arthritis, Bipolar disease, Obese    Medications: Oxycodone    Allergies:     Penicillin, Sulfa    Surgeries:   Hand surgery, gallbladder removal, back surgery, Oral Surgery     Social History       Smoking:  1 ppd          Alcohol:n Drug use:  n                           Exam: BMI  35. Multiple caries 3, 6, 7, 8, 9, 10, 12, 13, 14, 15, 20, 21, 23, 24, 25, 26, 28, 29, 30, 31. Lingual calculus #23-26. No purulence, edema, fluctuance, trismus. Pink papillary lesion on uvula. Pharynx clear. No lymphadenopathy.  Panorex:Multiple caries teeth # 3, 6, 7, 8, 9, 10, 12, 13, 14, 15, 20, 21, 23, 24, 25, 26, 28, 29, 30, 31.  Assessment: ASA 2. Non-restorable  teeth # 3, 6, 7, 8, 9, 10, 12, 13, 14, 15, 20, 21, 23, 24, 25, 26, 28, 29, 30, 31. 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            Plan: 1. MD Clearance.   2. Extraction Teeth #  3, 6, 7, 8, 9, 10, 12, 13, 14, 15, 20, 21, 23, 24, 25, 26, 28, 29, 30, 31.Keep teeth # 22, 27. Removal uvula lesion.  Alveoloplasty. Hospital Day surgery.                      Risks and complications explained. Questions answered.

## 2021-05-31 ENCOUNTER — Encounter (HOSPITAL_COMMUNITY): Payer: Self-pay | Admitting: Oral Surgery

## 2021-05-31 ENCOUNTER — Other Ambulatory Visit: Payer: Self-pay

## 2021-05-31 NOTE — Progress Notes (Signed)
PCP - Dr. Cardell Peach  Cardiologist - Denies  EP-Denies  Endocrine-Denies  Pulm-Denies  Chest x-ray - 08/29/20 (E)  EKG - 04/04/21 (E)  Stress Test - Denies  ECHO - 03/08/14 (E)  Cardiac Cath - Denies  AICD-na PM-na LOOP-na  Dialysis-Denies  Sleep Study - Denies CPAP - Denies  LABS- 06/02/21: CBC, BMP, PCR  ASA-Denies  ERAS- No  HA1C- 04/04/21 (E): 5.7 Fasting Blood Sugar - Pre-diabetic Checks Blood Sugar ___0__ times a day- Pt does not have to check his blood sugar at home.  Anesthesia- No  Pt denies having chest pain, sob, or fever during the pre-op phone call. All instructions explained to the pt, with a verbal understanding of the material including: as of today, stop taking all Aspirin (unless instructed by your doctor) and Other Aspirin containing products, Vitamins, Fish oils, and Herbal medications. Also stop all NSAIDS i.e. Advil, Ibuprofen, Motrin, Aleve, Anaprox, Naproxen, BC, Goody Powders, and all Supplements.  Pt also instructed to wear a mask and social distance if he has to go out before surgery. The opportunity to ask questions was provided.    Coronavirus Screening  Have you experienced the following symptoms:  Cough yes/no: No Fever (>100.40F)  yes/no: No Runny nose yes/no: No Sore throat yes/no: No Difficulty breathing/shortness of breath  yes/no: No  Have you or a family member traveled in the last 14 days and where? yes/no: No   If the patient indicates "YES" to the above questions, their PAT will be rescheduled to limit the exposure to others and, the surgeon will be notified. THE PATIENT WILL NEED TO BE ASYMPTOMATIC FOR 14 DAYS.   If the patient is not experiencing any of these symptoms, the PAT nurse will instruct them to NOT bring anyone with them to their appointment since they may have these symptoms or traveled as well.   Please remind your patients and families that hospital visitation restrictions are in effect and the importance of the  restrictions.

## 2021-06-01 NOTE — Anesthesia Preprocedure Evaluation (Addendum)
Anesthesia Evaluation  Patient identified by MRN, date of birth, ID band Patient awake    Reviewed: Allergy & Precautions, NPO status , Patient's Chart, lab work & pertinent test results  Airway Mallampati: I  TM Distance: <3 FB Neck ROM: Full    Dental  (+) Missing, Poor Dentition,    Pulmonary Current Smoker,    Pulmonary exam normal breath sounds clear to auscultation       Cardiovascular Exercise Tolerance: Good hypertension, Normal cardiovascular exam Rhythm:Regular Rate:Normal     Neuro/Psych PSYCHIATRIC DISORDERS Anxiety Depression Bipolar Disorder    GI/Hepatic (+)     substance abuse  marijuana use,   Endo/Other  obesity  Renal/GU negative Renal ROS     Musculoskeletal  (+) Arthritis , Osteoarthritis,    Abdominal   Peds negative pediatric ROS (+)  Hematology negative hematology ROS (+)   Anesthesia Other Findings   Reproductive/Obstetrics                           Anesthesia Physical Anesthesia Plan  ASA: 3  Anesthesia Plan: General   Post-op Pain Management:    Induction: Intravenous  PONV Risk Score and Plan: 1 and Treatment may vary due to age or medical condition  Airway Management Planned: Nasal ETT  Additional Equipment: None  Intra-op Plan:   Post-operative Plan:   Informed Consent: I have reviewed the patients History and Physical, chart, labs and discussed the procedure including the risks, benefits and alternatives for the proposed anesthesia with the patient or authorized representative who has indicated his/her understanding and acceptance.     Dental advisory given  Plan Discussed with: CRNA and Anesthesiologist  Anesthesia Plan Comments:        Anesthesia Quick Evaluation

## 2021-06-02 ENCOUNTER — Ambulatory Visit (HOSPITAL_COMMUNITY)
Admission: RE | Admit: 2021-06-02 | Discharge: 2021-06-02 | Disposition: A | Discharge status: Home / Self Care | Payer: Medicare HMO | Attending: Oral Surgery | Admitting: Oral Surgery

## 2021-06-02 ENCOUNTER — Ambulatory Visit (HOSPITAL_COMMUNITY): Payer: Medicare HMO | Admitting: Anesthesiology

## 2021-06-02 ENCOUNTER — Encounter (HOSPITAL_COMMUNITY)
Admission: RE | Disposition: A | Discharge status: Home / Self Care | Payer: Self-pay | Source: Home / Self Care | Attending: Oral Surgery

## 2021-06-02 ENCOUNTER — Encounter (HOSPITAL_COMMUNITY): Payer: Self-pay | Admitting: Oral Surgery

## 2021-06-02 DIAGNOSIS — E669 Obesity, unspecified: Secondary | ICD-10-CM | POA: Diagnosis not present

## 2021-06-02 DIAGNOSIS — D1039 Benign neoplasm of other parts of mouth: Secondary | ICD-10-CM | POA: Diagnosis not present

## 2021-06-02 DIAGNOSIS — F17210 Nicotine dependence, cigarettes, uncomplicated: Secondary | ICD-10-CM | POA: Insufficient documentation

## 2021-06-02 DIAGNOSIS — Z6833 Body mass index (BMI) 33.0-33.9, adult: Secondary | ICD-10-CM | POA: Insufficient documentation

## 2021-06-02 DIAGNOSIS — K029 Dental caries, unspecified: Secondary | ICD-10-CM | POA: Diagnosis not present

## 2021-06-02 DIAGNOSIS — I10 Essential (primary) hypertension: Secondary | ICD-10-CM | POA: Diagnosis not present

## 2021-06-02 HISTORY — PX: TOOTH EXTRACTION: SHX859

## 2021-06-02 HISTORY — DX: Prediabetes: R73.03

## 2021-06-02 LAB — GLUCOSE, CAPILLARY
Glucose-Capillary: 100 mg/dL — ABNORMAL HIGH (ref 70–99)
Glucose-Capillary: 128 mg/dL — ABNORMAL HIGH (ref 70–99)

## 2021-06-02 SURGERY — DENTAL RESTORATION/EXTRACTIONS
Anesthesia: General | Site: Mouth

## 2021-06-02 MED ORDER — LIDOCAINE-EPINEPHRINE 2 %-1:100000 IJ SOLN
INTRAMUSCULAR | Status: DC | PRN
  Administered 2021-06-02: 16 mL via INTRADERMAL

## 2021-06-02 MED ORDER — OXYMETAZOLINE HCL 0.05 % NA SOLN
NASAL | Status: AC
  Filled 2021-06-02: qty 30

## 2021-06-02 MED ORDER — IBUPROFEN 800 MG PO TABS: 800.0000 mg | ORAL_TABLET | Freq: Three times a day (TID) | ORAL | 0 refills | Status: DC | PRN

## 2021-06-02 MED ORDER — ACETAMINOPHEN 500 MG PO TABS: 1000.0000 mg | ORAL_TABLET | Freq: Once | ORAL | Status: AC

## 2021-06-02 MED ORDER — OXYMETAZOLINE HCL 0.05 % NA SOLN
NASAL | Status: DC | PRN
  Administered 2021-06-02 (×2): 2 via NASAL

## 2021-06-02 MED ORDER — PHENYLEPHRINE HCL (PRESSORS) 10 MG/ML IV SOLN
INTRAVENOUS | Status: DC | PRN
  Administered 2021-06-02: 80 ug via INTRAVENOUS

## 2021-06-02 MED ORDER — PROPOFOL 10 MG/ML IV BOLUS
INTRAVENOUS | Status: AC
  Filled 2021-06-02: qty 20

## 2021-06-02 MED ORDER — LIDOCAINE 2% (20 MG/ML) 5 ML SYRINGE
INTRAMUSCULAR | Status: AC
  Filled 2021-06-02: qty 5

## 2021-06-02 MED ORDER — OXYCODONE HCL 5 MG PO TABS: 5.0000 mg | ORAL_TABLET | Freq: Once | ORAL | Status: AC | PRN

## 2021-06-02 MED ORDER — ROCURONIUM 10MG/ML (10ML) SYRINGE FOR MEDFUSION PUMP - OPTIME
INTRAVENOUS | Status: DC | PRN
  Administered 2021-06-02: 90 mg via INTRAVENOUS

## 2021-06-02 MED ORDER — ONDANSETRON HCL 4 MG/2ML IJ SOLN: 4.0000 mg | Freq: Once | INTRAMUSCULAR | Status: DC | PRN

## 2021-06-02 MED ORDER — ONDANSETRON HCL 4 MG/2ML IJ SOLN
INTRAMUSCULAR | Status: AC
  Filled 2021-06-02: qty 2

## 2021-06-02 MED ORDER — AMISULPRIDE (ANTIEMETIC) 5 MG/2ML IV SOLN: 10.0000 mg | Freq: Once | INTRAVENOUS | Status: DC | PRN

## 2021-06-02 MED ORDER — PHENYLEPHRINE 40 MCG/ML (10ML) SYRINGE FOR IV PUSH (FOR BLOOD PRESSURE SUPPORT)
PREFILLED_SYRINGE | INTRAVENOUS | Status: AC
  Filled 2021-06-02: qty 10

## 2021-06-02 MED ORDER — CHLORHEXIDINE GLUCONATE 0.12 % MT SOLN
15.0000 mL | OROMUCOSAL | Status: AC
  Filled 2021-06-02: qty 15

## 2021-06-02 MED ORDER — LACTATED RINGERS IV SOLN: INTRAVENOUS | Status: DC

## 2021-06-02 MED ORDER — SODIUM CHLORIDE 0.9 % IR SOLN
Status: DC | PRN
  Administered 2021-06-02: 250 mL

## 2021-06-02 MED ORDER — OXYCODONE HCL 5 MG/5ML PO SOLN
5.0000 mg | Freq: Once | ORAL | Status: AC | PRN
  Administered 2021-06-02: 5 mg via ORAL

## 2021-06-02 MED ORDER — OXYCODONE HCL 5 MG PO TABS: 5.0000 mg | ORAL_TABLET | Freq: Four times a day (QID) | ORAL | 0 refills | Status: DC | PRN

## 2021-06-02 MED ORDER — MIDAZOLAM HCL 2 MG/2ML IJ SOLN
INTRAMUSCULAR | Status: AC
  Filled 2021-06-02: qty 2

## 2021-06-02 MED ORDER — ACETAMINOPHEN 500 MG PO TABS
ORAL_TABLET | ORAL | Status: AC
  Administered 2021-06-02: 1000 mg via ORAL
  Filled 2021-06-02: qty 2

## 2021-06-02 MED ORDER — ONDANSETRON HCL 4 MG/2ML IJ SOLN
INTRAMUSCULAR | Status: DC | PRN
  Administered 2021-06-02: 4 mg via INTRAVENOUS

## 2021-06-02 MED ORDER — MIDAZOLAM HCL 2 MG/2ML IJ SOLN
INTRAMUSCULAR | Status: DC | PRN
  Administered 2021-06-02: 2 mg via INTRAVENOUS

## 2021-06-02 MED ORDER — CHLORHEXIDINE GLUCONATE 0.12 % MT SOLN
OROMUCOSAL | Status: AC
  Administered 2021-06-02: 15 mL via OROMUCOSAL
  Filled 2021-06-02: qty 15

## 2021-06-02 MED ORDER — CLINDAMYCIN PHOSPHATE 600 MG/50ML IV SOLN
600.0000 mg | INTRAVENOUS | Status: AC
  Administered 2021-06-02: 600 mg via INTRAVENOUS

## 2021-06-02 MED ORDER — FENTANYL CITRATE (PF) 250 MCG/5ML IJ SOLN
INTRAMUSCULAR | Status: AC
  Filled 2021-06-02: qty 5

## 2021-06-02 MED ORDER — FENTANYL CITRATE (PF) 250 MCG/5ML IJ SOLN
INTRAMUSCULAR | Status: DC | PRN
  Administered 2021-06-02 (×2): 50 ug via INTRAVENOUS

## 2021-06-02 MED ORDER — OXYMETAZOLINE HCL 0.05 % NA SOLN
NASAL | Status: AC
  Filled 2021-06-02: qty 90

## 2021-06-02 MED ORDER — DEXAMETHASONE SODIUM PHOSPHATE 10 MG/ML IJ SOLN
INTRAMUSCULAR | Status: AC
  Filled 2021-06-02: qty 1

## 2021-06-02 MED ORDER — DEXAMETHASONE SODIUM PHOSPHATE 10 MG/ML IJ SOLN
INTRAMUSCULAR | Status: DC | PRN
  Administered 2021-06-02: 10 mg via INTRAVENOUS

## 2021-06-02 MED ORDER — LIDOCAINE-EPINEPHRINE 2 %-1:100000 IJ SOLN
INTRAMUSCULAR | Status: AC
  Filled 2021-06-02: qty 3

## 2021-06-02 MED ORDER — OXYCODONE HCL 5 MG/5ML PO SOLN
ORAL | Status: AC
  Filled 2021-06-02: qty 5

## 2021-06-02 MED ORDER — LACTATED RINGERS IV SOLN: INTRAVENOUS | Status: DC | PRN

## 2021-06-02 MED ORDER — SUGAMMADEX SODIUM 200 MG/2ML IV SOLN
INTRAVENOUS | Status: DC | PRN
  Administered 2021-06-02: 300 mg via INTRAVENOUS

## 2021-06-02 MED ORDER — FENTANYL CITRATE (PF) 100 MCG/2ML IJ SOLN: 25.0000 ug | INTRAMUSCULAR | Status: DC | PRN

## 2021-06-02 MED ORDER — PROPOFOL 10 MG/ML IV BOLUS
INTRAVENOUS | Status: DC | PRN
  Administered 2021-06-02: 200 mg via INTRAVENOUS

## 2021-06-02 MED ORDER — CLINDAMYCIN PHOSPHATE 600 MG/50ML IV SOLN
INTRAVENOUS | Status: AC
  Filled 2021-06-02: qty 50

## 2021-06-02 MED ORDER — LIDOCAINE HCL (CARDIAC) PF 100 MG/5ML IV SOSY
PREFILLED_SYRINGE | INTRAVENOUS | Status: DC | PRN
  Administered 2021-06-02: 100 mg via INTRATRACHEAL

## 2021-06-02 MED ORDER — ROCURONIUM BROMIDE 10 MG/ML (PF) SYRINGE
PREFILLED_SYRINGE | INTRAVENOUS | Status: AC
  Filled 2021-06-02: qty 10

## 2021-06-02 MED ORDER — CLINDAMYCIN HCL 300 MG PO CAPS: 300.0000 mg | ORAL_CAPSULE | Freq: Three times a day (TID) | ORAL | 0 refills | Status: DC

## 2021-06-02 MED ORDER — 0.9 % SODIUM CHLORIDE (POUR BTL) OPTIME
TOPICAL | Status: DC | PRN
  Administered 2021-06-02: 1000 mL

## 2021-06-02 MED ORDER — OXYMETAZOLINE HCL 0.05 % NA SOLN
NASAL | Status: DC | PRN
  Administered 2021-06-02: 1

## 2021-06-02 SURGICAL SUPPLY — 36 items
BAG COUNTER SPONGE SURGICOUNT (BAG) IMPLANT
BLADE SURG 15 STRL LF DISP TIS (BLADE) ×1 IMPLANT
BLADE SURG 15 STRL SS (BLADE) ×1
BUR CROSS CUT FISSURE 1.6 (BURR) ×2 IMPLANT
BUR EGG ELITE 4.0 (BURR) ×2 IMPLANT
CANISTER SUCT 3000ML PPV (MISCELLANEOUS) ×2 IMPLANT
COVER SURGICAL LIGHT HANDLE (MISCELLANEOUS) ×2 IMPLANT
DECANTER SPIKE VIAL GLASS SM (MISCELLANEOUS) ×2 IMPLANT
DRAPE U-SHAPE 76X120 STRL (DRAPES) ×2 IMPLANT
GAUZE PACKING FOLDED 2  STR (GAUZE/BANDAGES/DRESSINGS) ×1
GAUZE PACKING FOLDED 2 STR (GAUZE/BANDAGES/DRESSINGS) ×1 IMPLANT
GLOVE SURG ENC MOIS LTX SZ6.5 (GLOVE) IMPLANT
GLOVE SURG ENC MOIS LTX SZ7 (GLOVE) IMPLANT
GLOVE SURG ENC MOIS LTX SZ8 (GLOVE) ×2 IMPLANT
GLOVE SURG UNDER POLY LF SZ6.5 (GLOVE) IMPLANT
GLOVE SURG UNDER POLY LF SZ7 (GLOVE) IMPLANT
GOWN STRL REUS W/ TWL LRG LVL3 (GOWN DISPOSABLE) ×1 IMPLANT
GOWN STRL REUS W/ TWL XL LVL3 (GOWN DISPOSABLE) ×1 IMPLANT
GOWN STRL REUS W/TWL LRG LVL3 (GOWN DISPOSABLE) ×2
GOWN STRL REUS W/TWL XL LVL3 (GOWN DISPOSABLE) ×1
IV NS 1000ML (IV SOLUTION) ×1
IV NS 1000ML BAXH (IV SOLUTION) ×1 IMPLANT
KIT BASIN OR (CUSTOM PROCEDURE TRAY) ×2 IMPLANT
KIT TURNOVER KIT B (KITS) ×2 IMPLANT
NDL HYPO 25GX1X1/2 BEV (NEEDLE) ×2 IMPLANT
NEEDLE HYPO 25GX1X1/2 BEV (NEEDLE) ×4 IMPLANT
NS IRRIG 1000ML POUR BTL (IV SOLUTION) ×2 IMPLANT
PAD ARMBOARD 7.5X6 YLW CONV (MISCELLANEOUS) ×2 IMPLANT
SLEEVE IRRIGATION ELITE 7 (MISCELLANEOUS) ×2 IMPLANT
SPONGE SURGIFOAM ABS GEL 12-7 (HEMOSTASIS) IMPLANT
SUT CHROMIC 3 0 PS 2 (SUTURE) ×3 IMPLANT
SYR BULB IRRIG 60ML STRL (SYRINGE) ×2 IMPLANT
SYR CONTROL 10ML LL (SYRINGE) ×2 IMPLANT
TRAY ENT MC OR (CUSTOM PROCEDURE TRAY) ×2 IMPLANT
TUBING IRRIGATION (MISCELLANEOUS) ×2 IMPLANT
YANKAUER SUCT BULB TIP NO VENT (SUCTIONS) ×2 IMPLANT

## 2021-06-02 NOTE — Op Note (Addendum)
NAME: Benjamin Carter, Benjamin Carter MEDICAL RECORD NO: [id number] ACCOUNT NO: [id number] DATE OF BIRTH: 13-Dec-1980 FACILITY: [facility] LOCATION: [facility] PHYSICIAN: Gae Bon, DDS  Operative Report   DATE OF PROCEDURE: 06/02/2021  PREOPERATIVE DIAGNOSES:  Permanent nonrestorable teeth #3, 6, 7, 8, 9, 10, 12, 13, 14, 15, 20, 21,  23, 24, 25, 26, 28, 29, 30, and 31 secondary to dental caries, lesion of the uvula.  POSTOPERATIVE DIAGNOSES:  Permanent nonrestorable teeth #3, 6, 7, 8, 9, 10, 12, 13, 14, 15, 20, 21,  23, 24, 25, 26, 28, 29, 30, and 31 secondary to dental caries, lesion of the uvula.  PROCEDURE:  Extraction teeth #3, 6, 7, 8, 9, 10, 12, 13, 14, 15, 20, 21, 23, 24, 25, 26, 28, 29, 30, and 31; alveoloplasty; removal of lesion uvula.  SURGEON:  Gae Bon, DDS  ANESTHESIA:  General.  ATTENDING:  Dr. Elgie Congo.  DESCRIPTION OF PROCEDURE:  The patient was taken to the operating room and placed on the table in supine position.  General anesthesia was administered.  A nasal endotracheal tube was placed and secured.  The eyes were protected and the patient was  draped for surgery.  Timeout was performed.  The posterior pharynx was suctioned.  A throat pack was placed.  2% lidocaine 1:100,000 epinephrine was infiltrated in an inferior alveolar block on the right and left side of the mandible and buccal and  palatal infiltration of the maxilla around the teeth to be removed.  Additional anesthesia was deposited on the uvula.  The uvula was operated first.  A tissue forceps was used to secure the uvula and then 15 blade was used to remove the lesion.  The  lesion was submitted for pathology routine.  Preliminary diagnosis was verruca vulgaris.  Then, attention was turned to the left mandible.  A 15 blade was used to make an incision around teeth numbers 20, 21, 23, 24, 25, 26.  The periosteum was reflected  from around these teeth.  The teeth were elevated and removed with 301 elevator and Ash forceps.   The sockets were curetted.  Periosteum was reflected to expose the alveolar crest, which was irregular in contour and had sharp edges.  Alveoplasty was  performed using the egg bur and then the bone file and then the area was irrigated and closed with 3-0 chromic on the left maxilla.  A 15 blade was used to make an incision beginning at tooth #15 and carried forward in the gingival sulcus of teeth #14,  13, 12, 10, 9, 8 and 7.  The periosteum was reflected from around these teeth.  The teeth were elevated and removed from the mouth with a dental forceps.  Tissue was trimmed.  The periosteum was reflected.  The sockets were curetted and then alveoplasty  was performed using the egg bur, again followed by the bone file because of irregular alveolar edges and irregular contour of the alveolus.  Then, this area was irrigated and closed with 3-0 chromic.  The bite block and sweetheart retractor were  repositioned to the other side of the mouth.  A 15 blade was used to make an incision around teeth #28, 29, 30 and 31 in the mandible and around tooth #3 in the maxilla.  The periosteum was reflected.  The teeth were elevated and removed with dental  forceps.  Tooth #28 fractured upon attempted removal and a root tip was removed by using the [employer] handpiece with the fissure bur under irrigation and root  tip pick to remove the root tip.  Then, the tissue was reflected to expose the crest of the  alveolus in the maxilla.  There was bulbous tissue around tooth #3, which required alveoplasty.  The egg bur was used then for alveoplasty of the right maxilla and in the right mandible.  The superior border of the alveolus was somewhat hyperplastic  causing an undercut to be present, so an alveoplasty was performed here as well using an egg bur, followed by the bone file.  Then, the areas were irrigated and sutured.  The oral cavity was irrigated and suctioned.  The throat pack was removed.  The  patient was left under the  care of anesthesia for extubation and transported to recovery room with plans for discharge home through day surgery.  ESTIMATED BLOOD LOSS:  Minimal.  SPECIMEN:  Lesion of uvula.  PRIMARY DIAGNOSIS: Verruca vulgaris.   NIK D: 06/02/2021 8:24:50 am T: 06/02/2021 9:09:00 am  JOB: [id number]/ [id number]

## 2021-06-02 NOTE — Anesthesia Postprocedure Evaluation (Signed)
Anesthesia Post Note  Patient: Benjamin Carter  Procedure(s) Performed: DENTAL RESTORATION/EXTRACTIONS (Mouth)     Patient location during evaluation: PACU Anesthesia Type: General Level of consciousness: awake and alert Pain management: pain level controlled Vital Signs Assessment: post-procedure vital signs reviewed and stable Respiratory status: spontaneous breathing, nonlabored ventilation and respiratory function stable Cardiovascular status: blood pressure returned to baseline and stable Postop Assessment: no apparent nausea or vomiting Anesthetic complications: no   No notable events documented.  Last Vitals:  Vitals:   06/02/21 0905 06/02/21 0920  BP: 132/86 135/89  Pulse: 64 66  Resp: 18 19  Temp:  36.6 C  SpO2: 92% 93%    Last Pain:  Vitals:   06/02/21 0905  TempSrc:   PainSc: Asleep                 Merlinda Frederick

## 2021-06-02 NOTE — Anesthesia Procedure Notes (Signed)
Procedure Name: Intubation Date/Time: 06/02/2021 7:25 AM Performed by: Claris Che, CRNA Pre-anesthesia Checklist: Patient identified, Emergency Drugs available, Suction available, Patient being monitored and Timeout performed Patient Re-evaluated:Patient Re-evaluated prior to induction Oxygen Delivery Method: Circle system utilized Preoxygenation: Pre-oxygenation with 100% oxygen Induction Type: IV induction and Cricoid Pressure applied Ventilation: Mask ventilation without difficulty Laryngoscope Size: Mac and 4 Grade View: Grade I Nasal Tubes: Right and Nasal prep performed Tube size: 7.0 mm Number of attempts: 1 Placement Confirmation: ETT inserted through vocal cords under direct vision, positive ETCO2 and breath sounds checked- equal and bilateral Tube secured with: Tape Dental Injury: Teeth and Oropharynx as per pre-operative assessment

## 2021-06-02 NOTE — Transfer of Care (Signed)
Immediate Anesthesia Transfer of Care Note  Patient: Benjamin Carter  Procedure(s) Performed: DENTAL RESTORATION/EXTRACTIONS (Mouth)  Patient Location: PACU  Anesthesia Type:General  Level of Consciousness: oriented, drowsy, patient cooperative and responds to stimulation  Airway & Oxygen Therapy: Patient Spontanous Breathing and Patient connected to nasal cannula oxygen  Post-op Assessment: Report given to RN, Post -op Vital signs reviewed and stable and Patient moving all extremities X 4  Post vital signs: Reviewed and stable  Last Vitals:  Vitals Value Taken Time  BP 150/90 06/02/21 0832  Temp    Pulse 80 06/02/21 0833  Resp 21 06/02/21 0833  SpO2 89 % 06/02/21 0833  Vitals shown include unvalidated device data.  Last Pain:  Vitals:   06/02/21 0628  TempSrc: Oral  PainSc:       Patients Stated Pain Goal: 0 (83/50/75 [id number])  Complications: No notable events documented.

## 2021-06-02 NOTE — Op Note (Signed)
06/02/2021  8:17 AM  PATIENT:  Benjamin Carter  40 y.o. male  PRE-OPERATIVE DIAGNOSIS:  NON RESTORABLE TEETH # 3, 6, 7, 8, 9, 10, 12, 13, 14, 15, 20, 21, 23, 24, 25, 26, 28, 29, 30, 31 SECONDARY TO DENTAL CARIES, LESION UVULA  POST-OPERATIVE DIAGNOSIS:  SAME  PROCEDURE:  Procedure(s): DENTAL EXTRACTIONS TEETH # 3, 6, 7, 8, 9, 10, 12, 13, 14, 15, 20, 21, 23, 24, 25, 26, 28, 29, 30, 31, ALVEOLOPLASTY, REMOVAL LESION OF UVULA  SURGEON:  Surgeon(s): Diona Browner, DMD  ANESTHESIA:   local and general  EBL:  minimal  DRAINS: none   SPECIMEN:  LESION UVULA, VERRUCA VULGARIS  COUNTS:  YES  PLAN OF CARE: Discharge to home after PACU  PATIENT DISPOSITION:  PACU - hemodynamically stable.   PROCEDURE DETAILS: Dictation #[id number]  Gae Bon, DMD 06/02/2021 8:17 AM

## 2021-06-02 NOTE — H&P (Signed)
H&P documentation  -History and Physical Reviewed  -Patient has been re-examined  -No change in the plan of care     

## 2021-06-03 ENCOUNTER — Encounter (HOSPITAL_COMMUNITY): Payer: Self-pay | Admitting: Oral Surgery

## 2021-06-05 LAB — SURGICAL PATHOLOGY

## 2021-06-12 NOTE — H&P (Signed)
HISTORY AND PHYSICAL  Benjamin Carter is a 40 y.o. male patient with CC: Bad teeth.  No diagnosis found.  Past Medical History:  Diagnosis Date   Anxiety    Arthritis    Bipolar 1 disorder ([place])    Depression    History of MRSA infection    left index finger 03-07-2015 w/ positive blood cultures   History of prediabetes    Hyperlipidemia    Left hand pain    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)    Pre-diabetes    Wears glasses     No current facility-administered medications for this encounter.   Current Outpatient Medications  Medication Sig Dispense Refill   clindamycin (CLEOCIN) 300 MG capsule Take 1 capsule (300 mg total) by mouth 3 (three) times daily. 21 capsule 0   ibuprofen (ADVIL) 800 MG tablet Take 1 tablet (800 mg total) by mouth every 8 (eight) hours as needed. 15 tablet 0   oxyCODONE (OXY IR/ROXICODONE) 5 MG immediate release tablet Take 1 tablet (5 mg total) by mouth every 6 (six) hours as needed. 20 tablet 0   oxyCODONE-acetaminophen (PERCOCET) 10-325 MG tablet Take 1 tablet by mouth every 6 (six) hours as needed for pain.     mupirocin cream (BACTROBAN) 2 % Apply 1 application topically 2 (two) times daily. (Patient not taking: Reported on 05/31/2021) 15 g 0   Allergies  Allergen Reactions   Bactrim [Sulfamethoxazole-Trimethoprim]     Caused pancreatitis   Sulfites     Caused elevated liver enzymes   Doxycycline Rash   Toradol [Ketorolac Tromethamine] Rash   Active Problems:   * No active hospital problems. *  Vitals: Blood pressure 135/89, pulse 66, temperature 97.9 F (36.6 C), resp. rate 19, height 6\' 6"  (1.981 m), weight 129.7 kg, SpO2 93 %. Lab results:No results found for this or any previous visit (from the past 11 hour(s)). Radiology Results: No results found. General appearance: alert and cooperative Head: Normocephalic, without obvious abnormality, atraumatic Eyes: negative Nose: Nares normal. Septum midline. Mucosa normal. No drainage  or sinus tenderness. Throat: multiple dental caries. Papillary lesion on uvula.  Neck: no adenopathy and supple, symmetrical, trachea midline Resp: clear to auscultation bilaterally Cardio: regular rate and rhythm, S1, S2 normal, no murmur, click, rub or gallop  Assessment: Non-restorable teeth secondary to dental caries. Lesion uvula.   Plan:Dental extractions with alveoloplasty Removal uvula lesion. [place] 06/12/2021

## 2021-06-13 NOTE — Telephone Encounter (Signed)
Error message

## 2021-06-21 ENCOUNTER — Ambulatory Visit (INDEPENDENT_AMBULATORY_CARE_PROVIDER_SITE_OTHER): Payer: Medicare HMO

## 2021-06-21 VITALS — Ht 78.0 in | Wt 286.0 lb

## 2021-06-21 DIAGNOSIS — Z0001 Encounter for general adult medical examination with abnormal findings: Secondary | ICD-10-CM | POA: Diagnosis not present

## 2021-06-21 DIAGNOSIS — H9192 Unspecified hearing loss, left ear: Secondary | ICD-10-CM | POA: Diagnosis not present

## 2021-06-21 DIAGNOSIS — Z0000 Encounter for general adult medical examination without abnormal findings: Secondary | ICD-10-CM

## 2021-06-21 NOTE — Patient Instructions (Signed)
Benjamin Carter , Thank you for taking time to come for your Medicare Wellness Visit. I appreciate your ongoing commitment to your health goals. Please review the following plan we discussed and let me know if I can assist you in the future.   Screening recommendations/referrals: Colonoscopy: Not required until age 40  Recommended yearly ophthalmology/optometry visit for glaucoma screening and checkup Recommended yearly dental visit for hygiene and checkup  Vaccinations: Influenza vaccine: Declined.  Pneumococcal vaccine: Not required until age 20. Tdap vaccine: Done 04/29/2021 Repeat in 10 years  Shingles vaccine: Not required until age 43.   Covid-19: Declined.  Advanced directives: Advance directive discussed with you today. Even though you declined this today, please call our office should you change your mind, and we can give you the proper paperwork for you to fill out.   Conditions/risks identified: Aim for 30 minutes of exercise or brisk walking each day, drink 6-8 glasses of water and eat lots of fruits and vegetables.   Next appointment: Follow up in one year for your annual wellness visit 2023.  Preventive Care 40-64 Years, Male Preventive care refers to lifestyle choices and visits with your health care provider that can promote health and wellness. What does preventive care include? A yearly physical exam. This is also called an annual well check. Dental exams once or twice a year. Routine eye exams. Ask your health care provider how often you should have your eyes checked. Personal lifestyle choices, including: Daily care of your teeth and gums. Regular physical activity. Eating a healthy diet. Avoiding tobacco and drug use. Limiting alcohol use. Practicing safe sex. Taking low-dose aspirin every day starting at age 68. What happens during an annual well check? The services and screenings done by your health care provider during your annual well check will depend on  your age, overall health, lifestyle risk factors, and family history of disease. Counseling  Your health care provider may ask you questions about your: Alcohol use. Tobacco use. Drug use. Emotional well-being. Home and relationship well-being. Sexual activity. Eating habits. Work and work Statistician. Screening  You may have the following tests or measurements: Height, weight, and BMI. Blood pressure. Lipid and cholesterol levels. These may be checked every 5 years, or more frequently if you are over 65 years old. Skin check. Lung cancer screening. You may have this screening every year starting at age 69 if you have a 30-pack-year history of smoking and currently smoke or have quit within the past 15 years. Fecal occult blood test (FOBT) of the stool. You may have this test every year starting at age 32. Flexible sigmoidoscopy or colonoscopy. You may have a sigmoidoscopy every 5 years or a colonoscopy every 10 years starting at age 79. Prostate cancer screening. Recommendations will vary depending on your family history and other risks. Hepatitis C blood test. Hepatitis B blood test. Sexually transmitted disease (STD) testing. Diabetes screening. This is done by checking your blood sugar (glucose) after you have not eaten for a while (fasting). You may have this done every 1-3 years. Discuss your test results, treatment options, and if necessary, the need for more tests with your health care provider. Vaccines  Your health care provider may recommend certain vaccines, such as: Influenza vaccine. This is recommended every year. Tetanus, diphtheria, and acellular pertussis (Tdap, Td) vaccine. You may need a Td booster every 10 years. Zoster vaccine. You may need this after age 38. Pneumococcal 13-valent conjugate (PCV13) vaccine. You may need this if you have  certain conditions and have not been vaccinated. Pneumococcal polysaccharide (PPSV23) vaccine. You may need one or two doses if  you smoke cigarettes or if you have certain conditions. Talk to your health care provider about which screenings and vaccines you need and how often you need them. This information is not intended to replace advice given to you by your health care provider. Make sure you discuss any questions you have with your health care provider. Document Released: 08/05/2015 Document Revised: 03/28/2016 Document Reviewed: 05/10/2015 Elsevier Interactive Patient Education  2017 [employer] Prevention in the Home Falls can cause injuries. They can happen to people of all ages. There are many things you can do to make your home safe and to help prevent falls. What can I do on the outside of my home? Regularly fix the edges of walkways and driveways and fix any cracks. Remove anything that might make you trip as you walk through a door, such as a raised step or threshold. Trim any bushes or trees on the path to your home. Use bright outdoor lighting. Clear any walking paths of anything that might make someone trip, such as rocks or tools. Regularly check to see if handrails are loose or broken. Make sure that both sides of any steps have handrails. Any raised decks and porches should have guardrails on the edges. Have any leaves, snow, or ice cleared regularly. Use sand or salt on walking paths during winter. Clean up any spills in your garage right away. This includes oil or grease spills. What can I do in the bathroom? Use night lights. Install grab bars by the toilet and in the tub and shower. Do not use towel bars as grab bars. Use non-skid mats or decals in the tub or shower. If you need to sit down in the shower, use a plastic, non-slip stool. Keep the floor dry. Clean up any water that spills on the floor as soon as it happens. Remove soap buildup in the tub or shower regularly. Attach bath mats securely with double-sided non-slip rug tape. Do not have throw rugs and other things on the  floor that can make you trip. What can I do in the bedroom? Use night lights. Make sure that you have a light by your bed that is easy to reach. Do not use any sheets or blankets that are too big for your bed. They should not hang down onto the floor. Have a firm chair that has side arms. You can use this for support while you get dressed. Do not have throw rugs and other things on the floor that can make you trip. What can I do in the kitchen? Clean up any spills right away. Avoid walking on wet floors. Keep items that you use a lot in easy-to-reach places. If you need to reach something above you, use a strong step stool that has a grab bar. Keep electrical cords out of the way. Do not use floor polish or wax that makes floors slippery. If you must use wax, use non-skid floor wax. Do not have throw rugs and other things on the floor that can make you trip. What can I do with my stairs? Do not leave any items on the stairs. Make sure that there are handrails on both sides of the stairs and use them. Fix handrails that are broken or loose. Make sure that handrails are as long as the stairways. Check any carpeting to make sure that it is firmly attached  to the stairs. Fix any carpet that is loose or worn. Avoid having throw rugs at the top or bottom of the stairs. If you do have throw rugs, attach them to the floor with carpet tape. Make sure that you have a light switch at the top of the stairs and the bottom of the stairs. If you do not have them, ask someone to add them for you. What else can I do to help prevent falls? Wear shoes that: Do not have high heels. Have rubber bottoms. Are comfortable and fit you well. Are closed at the toe. Do not wear sandals. If you use a stepladder: Make sure that it is fully opened. Do not climb a closed stepladder. Make sure that both sides of the stepladder are locked into place. Ask someone to hold it for you, if possible. Clearly mark and make  sure that you can see: Any grab bars or handrails. First and last steps. Where the edge of each step is. Use tools that help you move around (mobility aids) if they are needed. These include: Canes. Walkers. Scooters. Crutches. Turn on the lights when you go into a dark area. Replace any light bulbs as soon as they burn out. Set up your furniture so you have a clear path. Avoid moving your furniture around. If any of your floors are uneven, fix them. If there are any pets around you, be aware of where they are. Review your medicines with your doctor. Some medicines can make you feel dizzy. This can increase your chance of falling. Ask your doctor what other things that you can do to help prevent falls. This information is not intended to replace advice given to you by your health care provider. Make sure you discuss any questions you have with your health care provider. Document Released: 05/05/2009 Document Revised: 12/15/2015 Document Reviewed: 08/13/2014 Elsevier Interactive Patient Education  2017 [employer].
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
## 2021-07-05 ENCOUNTER — Ambulatory Visit: Payer: Medicare HMO | Admitting: Family Medicine

## 2021-07-05 ENCOUNTER — Encounter: Payer: Self-pay | Admitting: Family Medicine

## 2021-10-16 ENCOUNTER — Ambulatory Visit (INDEPENDENT_AMBULATORY_CARE_PROVIDER_SITE_OTHER): Payer: Medicare HMO

## 2021-10-16 ENCOUNTER — Ambulatory Visit
Admission: EM | Admit: 2021-10-16 | Discharge: 2021-10-16 | Disposition: A | Discharge status: Home / Self Care | Payer: Medicare HMO | Attending: Urgent Care | Admitting: Urgent Care

## 2021-10-16 ENCOUNTER — Other Ambulatory Visit: Payer: Self-pay

## 2021-10-16 DIAGNOSIS — G5602 Carpal tunnel syndrome, left upper limb: Secondary | ICD-10-CM | POA: Diagnosis not present

## 2021-10-16 DIAGNOSIS — M25532 Pain in left wrist: Secondary | ICD-10-CM

## 2021-10-16 MED ORDER — PREDNISONE 10 MG PO TABS: 30.0000 mg | ORAL_TABLET | Freq: Every day | ORAL | 0 refills | Status: DC

## 2021-10-16 NOTE — ED Provider Notes (Signed)
?[facility] ? ? ?MRN: [id number] DOB: 27-Sep-1980 ? ?Subjective:  ? ?Benjamin Carter is a 41 y.o. male presenting for 2 week history of persistent left wrist pain.  No fall, trauma.  Patient is very busy, does a lot of manual labor.  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  He has had to delay this due to personal financial stressors and needing to take care of his wife. ? ?No current facility-administered medications for this encounter. ? ?Current Outpatient Medications:  ?  clindamycin (CLEOCIN) 300 MG capsule, Take 1 capsule (300 mg total) by mouth 3 (three) times daily. (Patient not taking: Reported on 06/21/2021), Disp: 21 capsule, Rfl: 0 ?  ibuprofen (ADVIL) 800 MG tablet, Take 1 tablet (800 mg total) by mouth every 8 (eight) hours as needed., Disp: 15 tablet, Rfl: 0 ?  mupirocin cream (BACTROBAN) 2 %, Apply 1 application topically 2 (two) times daily., Disp: 15 g, Rfl: 0 ?  oxyCODONE (OXY IR/ROXICODONE) 5 MG immediate release tablet, Take 1 tablet (5 mg total) by mouth every 6 (six) hours as needed., Disp: 20 tablet, Rfl: 0 ?  oxyCODONE-acetaminophen (PERCOCET) 10-325 MG tablet, Take 1 tablet by mouth every 6 (six) hours as needed for pain., Disp: , Rfl:   ? ?Allergies  ?Allergen Reactions  ? Bactrim [Sulfamethoxazole-Trimethoprim]   ?  Caused pancreatitis  ? Sulfites   ?  Caused elevated liver enzymes  ? Doxycycline Rash  ? Toradol [Ketorolac Tromethamine] Rash  ? ? ?Past Medical History:  ?Diagnosis Date  ? Anxiety   ? Arthritis   ? Bipolar 1 disorder ([place])   ? Depression   ? History of MRSA infection   ? left index finger 03-07-2015 w/ positive blood cultures  ? History of prediabetes   ? Hyperlipidemia   ? Left hand pain   ? Osteomyelitis of finger of left hand ([place])   ? Pre-diabetes   ? Wears glasses   ?  ? ?Past Surgical History:  ?Procedure Laterality Date  ? AMPUTATION Left 03/10/2014  ? Procedure: Irrigation and  Debridement and Revision of Amputation Left Index Finger;  Surgeon: Linna Hoff, MD;  Location: [facility];  Service: Orthopedics;  Laterality: Left;  Wants to go at [facility] Left 10/21/2014  ? Procedure: LEFT LONG FINGER AMPUTATION REVISION;  Surgeon: Iran Planas, MD;  [facility];  Service: Orthopedics;  Laterality: Left;  ? CHOLECYSTECTOMY N/A 12/07/2013  ? Procedure: LAPAROSCOPIC CHOLECYSTECTOMY;  Surgeon: Jamesetta So, MD;  Location: [facility];  Service: General;  Laterality: N/A;  ? GALLBLADDER SURGERY  2015  ? I & D EXTREMITY Left 03/07/2014  ? Procedure: IRRIGATION AND DEBRIDEMENT INDEX FINGER;  Surgeon: Linna Hoff, MD;  Location: [facility];  Service: Orthopedics;  Laterality: Left;  ? [place] SURGERY  2004  ? TOOTH EXTRACTION N/A 06/02/2021  ? Procedure: DENTAL RESTORATION/EXTRACTIONS;  Surgeon: Diona Browner, DMD;  Location: [facility];  Service: Oral Surgery;  Laterality: N/A;  ? ? ?Family History  ?Problem Relation Age of Onset  ? Diabetes Mother   ? Hyperlipidemia Mother   ? Hypertension Mother   ? Heart disease Mother   ? Kidney failure Mother   ? Neuropathy Mother   ? Bipolar disorder Mother   ? Heart attack Father   ?     35, 29 first MI  ? Cancer Father   ?     colon upper  13's  ? Bipolar disorder Father   ? Stroke Maternal Grandmother   ? Cirrhosis Maternal Grandfather   ? Mental illness Cousin   ? Suicidality Cousin   ? ? ?Social History  ? ?Tobacco Use  ? Smoking status: Every Day  ?  Packs/day: 0.50  ?  Years: 20.00  ?  Pack years: 10.00  ?  Types: Cigarettes  ? Smokeless tobacco: Never  ?Vaping Use  ? Vaping Use: Never used  ?Substance Use Topics  ? Alcohol use: Yes  ?  Comment: social infrequently  ? Drug use: Yes  ?  Frequency: 4.0 times per week  ?  Types: Marijuana  ? ? ?ROS ? ? ?Objective:  ? ?Vitals: ?BP 130/82 (BP Location: Right Arm)   Pulse 65   Temp 98.2 ?F (36.8 ?C) (Oral)   Resp 20   SpO2 97%  ? ?Physical Exam ?Constitutional:   ?   General: He is not in acute  distress. ?   Appearance: Normal appearance. He is well-developed and normal weight. He is not ill-appearing, toxic-appearing or diaphoretic.  ?HENT:  ?   Head: Normocephalic and atraumatic.  ?   Right Ear: External ear normal.  ?   Left Ear: External ear normal.  ?   Nose: Nose normal.  ?   Mouth/Throat:  ?   Pharynx: Oropharynx is clear.  ?Eyes:  ?   General: No scleral icterus.    ?   Right eye: No discharge.     ?   Left eye: No discharge.  ?   Extraocular Movements: Extraocular movements intact.  ?Cardiovascular:  ?   Rate and Rhythm: Normal rate.  ?Pulmonary:  ?   Effort: Pulmonary effort is normal.  ?Musculoskeletal:  ?   Left wrist: Tenderness (on either side of the wrist) present. No swelling, deformity, effusion, lacerations, bony tenderness, snuff box tenderness or crepitus. Decreased range of motion.  ?   Cervical back: Normal range of motion.  ?   Comments: Positive Tinel's.  ?Neurological:  ?   Mental Status: He is alert and oriented to person, place, and time.  ?Psychiatric:     ?   Mood and Affect: Mood normal.     ?   Behavior: Behavior normal.     ?   Thought Content: Thought content normal.     ?   Judgment: Judgment normal.  ? ? ?DG Wrist Complete Left ? ?Result Date: 10/16/2021 ?CLINICAL DATA:  One-week of left wrist pain. No known injury. History of remote fracture. EXAM: LEFT WRIST - COMPLETE 3+ VIEW COMPARISON:  Left hand radiographs 03/06/2014 FINDINGS: Mild thumb metacarpophalangeal joint space narrowing and peripheral osteophytosis, new from prior. The cortices are intact. No acute fracture is seen. No dislocation. IMPRESSION: Mild thumb metacarpophalangeal osteoarthritis. Electronically Signed   By: Yvonne Kendall M.D.   On: 10/16/2021 10:32   ? ? ?Assessment and Plan :  ? ?PDMP not reviewed this encounter. ? ?1. Left wrist pain   ?2. Carpal tunnel syndrome of left wrist   ? ?Recommended patient wear wrist splints nightly.  Offered an oral prednisone course given the severity and  persistence of his wrist pain which is likely inflammatory, compounded by his CTS.  Recommended close follow-up with his orthopedist. Counseled patient on potential for adverse effects with medications prescribed/recommended today, ER and return-to-clinic precautions discussed, patient verbalized understanding. ? ?  ?Jaynee Eagles, PA-C ?10/16/21 1056 ? ?

## 2021-10-16 NOTE — ED Triage Notes (Signed)
Pt reports left wrist pain and shooting pain to left hand x 1 week. States he can not close his left hand.States he has Carpal tunnel.,  ?

## 2021-12-25 ENCOUNTER — Ambulatory Visit
Admission: RE | Admit: 2021-12-25 | Discharge: 2021-12-25 | Disposition: A | Discharge status: Home / Self Care | Payer: Medicare HMO | Source: Ambulatory Visit | Attending: Nurse Practitioner | Admitting: Nurse Practitioner

## 2021-12-25 VITALS — BP 126/85 | HR 57 | Temp 97.7°F | Resp 20

## 2021-12-25 DIAGNOSIS — R21 Rash and other nonspecific skin eruption: Secondary | ICD-10-CM

## 2021-12-25 MED ORDER — PREDNISONE 10 MG (21) PO TBPK: ORAL_TABLET | ORAL | 0 refills | Status: AC

## 2021-12-25 MED ORDER — TRIAMCINOLONE ACETONIDE 0.1 % EX OINT
1.0000 | TOPICAL_OINTMENT | Freq: Two times a day (BID) | CUTANEOUS | 0 refills | Status: DC
Start: 2021-12-25 — End: 2023-02-21

## 2021-12-25 MED ORDER — METHYLPREDNISOLONE SODIUM SUCC 125 MG IJ SOLR: 60.0000 mg | Freq: Once | INTRAMUSCULAR | Status: DC

## 2021-12-25 MED ORDER — DEXAMETHASONE SODIUM PHOSPHATE 10 MG/ML IJ SOLN
10.0000 mg | Freq: Once | INTRAMUSCULAR | Status: AC
  Administered 2021-12-25: 10 mg via INTRAMUSCULAR

## 2021-12-25 NOTE — Discharge Instructions (Addendum)
-   We have given you a shot of steroid today to help with the inflammation from the rash - Please start the prednisone taper tomorrow and take the entire course - Start using the Kenalog ointment in small amounts to the affected areas; do not use for more than 2 weeks consecutively - You can use Benadryl at night time to help with itch - Please seek care if your symptoms persist or worsen despite this treatment

## 2021-12-25 NOTE — ED Provider Notes (Signed)
[facility]    CSN: [id number] Arrival date & time: 12/25/21  0841      History   Chief Complaint Chief Complaint  Patient presents with   Rash    Posion oak, poison ivy and sumac. I've taken benadryl and caiman lotion and it getting worse. - Entered by patient    HPI Benjamin Carter is a 41 y.o. male.   Patient presents with rash to his arms, lower abdomen, and face that has been present since Friday.  He reports last week, he was heading around in [place] and thinks he may have been exposed to poison ivy or sumac.  He reports the areas are red, itchy, and burning.  They are starting to blister over.  Denies any oozing, fevers, nausea/vomiting.  He reports he has never had poison ivy or sumac allergy before.  He reports the rash is getting worse.  He denies any shortness of breath, throat or tongue swelling, new muscle pain or joint aches.  He has been trying calamine lotion and Benadryl without relief.  He also has tried some leftover prednisone which helped a little bit.  Past medical history significant for osteomyelitis, prediabetes.  Last A1c was 5.7% less than a year ago.   Past Medical History:  Diagnosis Date   Anxiety    Arthritis    Bipolar 1 disorder ([place])    Depression    History of MRSA infection    left index finger 03-07-2015 w/ positive blood cultures   History of prediabetes    Hyperlipidemia    Left hand pain    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)    Pre-diabetes    Wears glasses     Patient Active Problem List   Diagnosis Date Noted   Prediabetes 04/11/2021   Sesamoiditis of left foot 04/11/2021   Morbid obesity ([place]) 04/04/2021   Cannabis use disorder, moderate, dependence ([place]) 03/23/2019   Tobacco use disorder 03/23/2019   Alcohol use disorder, mild, abuse 03/23/2019   Bipolar I disorder, most recent episode mixed ([place]) 02/16/2019   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/2019   Anxiety disorder 02/16/2019   Hypertension  07/10/2018   Bipolar 1 disorder ([place]) 07/10/2018   Osteomyelitis of finger of left hand ([place]) 08/05/2017   Anxiety 03/07/2014   Neuropathy 03/07/2014   History of pancreatitis 03/07/2014    Past Surgical History:  Procedure Laterality Date   AMPUTATION Left 03/10/2014   Procedure: Irrigation and Debridement and Revision of Amputation Left Index Finger;  Surgeon: Linna Hoff, MD;  Location: [facility];  Service: Orthopedics;  Laterality: Left;  Wants to go at [facility] Left 10/21/2014   Procedure: LEFT LONG FINGER AMPUTATION REVISION;  Surgeon: Iran Planas, MD;  Location: [facility];  Service: Orthopedics;  Laterality: Left;   CHOLECYSTECTOMY N/A 12/07/2013   Procedure: LAPAROSCOPIC CHOLECYSTECTOMY;  Surgeon: Jamesetta So, MD;  Location: [facility];  Service: General;  Laterality: N/A;   GALLBLADDER SURGERY  2015   I & D EXTREMITY Left 03/07/2014   Procedure: IRRIGATION AND DEBRIDEMENT INDEX FINGER;  Surgeon: Linna Hoff, MD;  Location: [facility];  Service: Orthopedics;  Laterality: Left;   LUMBAR DISC SURGERY  2004   TOOTH EXTRACTION N/A 06/02/2021   Procedure: DENTAL RESTORATION/EXTRACTIONS;  Surgeon: Diona Browner, DMD;  Location: [facility];  Service: Oral Surgery;  Laterality: N/A;       Home Medications    Prior to Admission medications   Medication Sig  Start Date End Date Taking? Authorizing Provider  predniSONE (STERAPRED UNI-PAK 21 TAB) 10 MG (21) TBPK tablet Take 6 tablets (60 mg total) by mouth daily for 1 day, THEN 5 tablets (50 mg total) daily for 1 day, THEN 4 tablets (40 mg total) daily for 1 day, THEN 3 tablets (30 mg total) daily for 1 day, THEN 2 tablets (20 mg total) daily for 1 day, THEN 1 tablet (10 mg total) daily for 1 day. 12/25/21 12/31/21 Yes Noemi Chapel A, NP  triamcinolone ointment (KENALOG) 0.1 % Apply 1 application. topically 2 (two) times daily. Apply sparingly to affected areas; do not use for more than 2 weeks consecutively 12/25/21  Yes Noemi Chapel A, NP   ibuprofen (ADVIL) 800 MG tablet Take 1 tablet (800 mg total) by mouth every 8 (eight) hours as needed. 06/02/21   Diona Browner, DMD  mupirocin cream (BACTROBAN) 2 % Apply 1 application topically 2 (two) times daily. 05/05/21   Felipa Furnace, DPM  oxyCODONE (OXY IR/ROXICODONE) 5 MG immediate release tablet Take 1 tablet (5 mg total) by mouth every 6 (six) hours as needed. 06/02/21   Diona Browner, DMD  oxyCODONE-acetaminophen (PERCOCET) 10-325 MG tablet Take 1 tablet by mouth every 6 (six) hours as needed for pain.    [provider]    Family History Family History  Problem Relation Age of Onset   Diabetes Mother    Hyperlipidemia Mother    Hypertension Mother    Heart disease Mother    Kidney failure Mother    Neuropathy Mother    Bipolar disorder Mother    Heart attack Father        74, 73 first MI   Cancer Father        colon upper 46's   Bipolar disorder Father    Stroke Maternal Grandmother    Cirrhosis Maternal Grandfather    Mental illness Cousin    Suicidality Cousin     Social History Social History   Tobacco Use   Smoking status: Every Day    Packs/day: 0.50    Years: 20.00    Pack years: 10.00    Types: Cigarettes   Smokeless tobacco: Never  Vaping Use   Vaping Use: Never used  Substance Use Topics   Alcohol use: Yes    Comment: social infrequently   Drug use: Yes    Frequency: 4.0 times per week    Types: Marijuana     Allergies   Bactrim [sulfamethoxazole-trimethoprim], Sulfites, Doxycycline, and Toradol [ketorolac tromethamine]   Review of Systems Review of Systems Per HPI  Physical Exam Triage Vital Signs ED Triage Vitals  Enc Vitals Group     BP 12/25/21 0915 126/85     Pulse Rate 12/25/21 0915 (!) 57     Resp 12/25/21 0915 20     Temp 12/25/21 0915 97.7 F (36.5 C)     Temp src --      SpO2 12/25/21 0915 96 %     Weight --      Height --      Head Circumference --      Peak Flow --      Pain Score 12/25/21 0914 0      Pain Loc --      Pain Edu? --      Excl. in [place]? --    No data found.  Updated Vital Signs BP 126/85   Pulse (!) 57   Temp 97.7 F (36.5 C)  Resp 20   SpO2 96%   Visual Acuity Right Eye Distance:   Left Eye Distance:   Bilateral Distance:    Right Eye Near:   Left Eye Near:    Bilateral Near:     Physical Exam Vitals and nursing note reviewed.  Constitutional:      General: He is not in acute distress.    Appearance: Normal appearance. He is not toxic-appearing.  HENT:     Head: Normocephalic and atraumatic.     Mouth/Throat:     Mouth: Mucous membranes are moist.     Pharynx: Oropharynx is clear. No oropharyngeal exudate or posterior oropharyngeal erythema.  Pulmonary:     Effort: Pulmonary effort is normal. No respiratory distress.  Skin:    General: Skin is warm and dry.     Capillary Refill: Capillary refill takes less than 2 seconds.     Findings: Rash present. Rash is macular, papular and vesicular.     Comments: Erythematous, maculopapular, vesicular rash noted to bilateral face, arms.  There is no surrounding erythema or fluctuance.  Neurological:     Mental Status: He is alert and oriented to person, place, and time.     Motor: No weakness.     Gait: Gait normal.  Psychiatric:        Behavior: Behavior is cooperative.     UC Treatments / Results  Labs (all labs ordered are listed, but only abnormal results are displayed) Labs Reviewed - No data to display  EKG   Radiology No results found.  Procedures Procedures (including critical care time)  Medications Ordered in UC Medications  dexamethasone (DECADRON) injection 10 mg (10 mg Intramuscular Given 12/25/21 0944)    Initial Impression / Assessment and Plan / UC Course  I have reviewed the triage vital signs and the nursing notes.  Pertinent labs & imaging results that were available during my care of the patient were reviewed by me and considered in my medical decision making (see chart  for details).    Suspect dermatitis secondary to plant oil.  Treat with Decadron 10 mg IM given today in urgent care as Solu-Medrol is currently on national back order.  Tomorrow, start prednisone taper.  Can also use Kenalog ointment to affected areas to help with itch.  Use Benadryl at nighttime as well to help with itch.  Seek care if symptoms persist or worsen despite treatment.   Final Clinical Impressions(s) / UC Diagnoses   Final diagnoses:  Rash and nonspecific skin eruption     Discharge Instructions      - We have given you a shot of steroid today to help with the inflammation from the rash - Please start the prednisone taper tomorrow and take the entire course - Start using the Kenalog ointment in small amounts to the affected areas; do not use for more than 2 weeks consecutively - You can use Benadryl at night time to help with itch - Please seek care if your symptoms persist or worsen despite this treatment    ED Prescriptions     Medication Sig Dispense Auth. Provider   predniSONE (STERAPRED UNI-PAK 21 TAB) 10 MG (21) TBPK tablet Take 6 tablets (60 mg total) by mouth daily for 1 day, THEN 5 tablets (50 mg total) daily for 1 day, THEN 4 tablets (40 mg total) daily for 1 day, THEN 3 tablets (30 mg total) daily for 1 day, THEN 2 tablets (20 mg total) daily for 1 day, THEN 1  tablet (10 mg total) daily for 1 day. 21 each Noemi Chapel A, NP   triamcinolone ointment (KENALOG) 0.1 % Apply 1 application. topically 2 (two) times daily. Apply sparingly to affected areas; do not use for more than 2 weeks consecutively 30 g Eulogio Bear, NP      PDMP not reviewed this encounter.   Eulogio Bear, NP 12/25/21 1020

## 2021-12-25 NOTE — ED Triage Notes (Signed)
Pt presents with rash on arms and face , possibly poison oak

## 2022-01-04 ENCOUNTER — Ambulatory Visit (INDEPENDENT_AMBULATORY_CARE_PROVIDER_SITE_OTHER): Payer: Medicare HMO

## 2022-01-04 ENCOUNTER — Ambulatory Visit (INDEPENDENT_AMBULATORY_CARE_PROVIDER_SITE_OTHER): Payer: Medicare HMO | Admitting: Podiatry

## 2022-01-04 DIAGNOSIS — M779 Enthesopathy, unspecified: Secondary | ICD-10-CM

## 2022-01-04 DIAGNOSIS — L03119 Cellulitis of unspecified part of limb: Secondary | ICD-10-CM | POA: Diagnosis not present

## 2022-01-04 DIAGNOSIS — L02619 Cutaneous abscess of unspecified foot: Secondary | ICD-10-CM | POA: Diagnosis not present

## 2022-01-04 MED ORDER — AMOXICILLIN-POT CLAVULANATE 875-125 MG PO TABS: 1.0000 | ORAL_TABLET | Freq: Two times a day (BID) | ORAL | 0 refills | Status: DC

## 2022-01-04 NOTE — Progress Notes (Signed)
Subjective:   Patient ID: Benjamin Carter, male   DOB: 41 y.o.   MRN: [id number]   HPI Patient presents stating he is developed a lot of swelling over the last day in the left plantar foot does not remember stepping on anything injury went to the emergency room but they did not open up properly the did place him on antibiotics.  He has quite a bit of redness in the left forefoot nothing proximal to this no systemic infection no increased temperature or other pathology noted   ROS      Objective:  Physical Exam  Neurovascular status unchanged she states his sugar has been running well he is got a area of erythema edema on the plantar aspect left first metatarsal head and some redness around the area no proximal edema erythema drainage noted     Assessment:  Abscess of the left plantar foot that may have been trauma or just abrasion from the keratotic lesion appears localized and we are catching it very early     Plan:  H&P x-ray reviewed sterile prep and did go ahead and open the area up and drained some purulent tissue and did culture this.  I am also sending for blood work metabolic panel CBC and sed rate and I am placing him on Augmentin 875 twice daily with strict instructions if any further redness were to occur or any indications of systemic infection he is to go straight to the emergency and understands he may require amputation at 1 point in future.  He will be seen back 1 week or earlier if necessary  X-rays indicate no signs currently of osteolysis osteomyelitis but is still very early in the pathology

## 2022-01-05 ENCOUNTER — Other Ambulatory Visit: Payer: Self-pay | Admitting: Podiatry

## 2022-01-06 LAB — COMPREHENSIVE METABOLIC PANEL
AG Ratio: 1.7 (calc) (ref 1.0–2.5)
ALT: 19 U/L (ref 9–46)
AST: 20 U/L (ref 10–40)
Albumin: 4.2 g/dL (ref 3.6–5.1)
Alkaline phosphatase (APISO): 60 U/L (ref 36–130)
BUN: 13 mg/dL (ref 7–25)
CO2: 25 mmol/L (ref 20–32)
Calcium: 9.2 mg/dL (ref 8.6–10.3)
Chloride: 106 mmol/L (ref 98–110)
Creat: 0.97 mg/dL (ref 0.60–1.29)
Globulin: 2.5 g/dL (calc) (ref 1.9–3.7)
Glucose, Bld: 124 mg/dL — ABNORMAL HIGH (ref 65–99)
Potassium: 4.4 mmol/L (ref 3.5–5.3)
Sodium: 139 mmol/L (ref 135–146)
Total Bilirubin: 1.1 mg/dL (ref 0.2–1.2)
Total Protein: 6.7 g/dL (ref 6.1–8.1)

## 2022-01-06 LAB — CBC WITH DIFFERENTIAL/PLATELET
Absolute Monocytes: 719 cells/uL (ref 200–950)
Basophils Absolute: 73 cells/uL (ref 0–200)
Basophils Relative: 0.8 %
Eosinophils Absolute: 319 cells/uL (ref 15–500)
Eosinophils Relative: 3.5 %
HCT: 43.9 % (ref 38.5–50.0)
Hemoglobin: 15.4 g/dL (ref 13.2–17.1)
Lymphs Abs: 2785 cells/uL (ref 850–3900)
MCH: 33.4 pg — ABNORMAL HIGH (ref 27.0–33.0)
MCHC: 35.1 g/dL (ref 32.0–36.0)
MCV: 95.2 fL (ref 80.0–100.0)
MPV: 11.1 fL (ref 7.5–12.5)
Monocytes Relative: 7.9 %
Neutro Abs: 5205 cells/uL (ref 1500–7800)
Neutrophils Relative %: 57.2 %
Platelets: 225 10*3/uL (ref 140–400)
RBC: 4.61 10*6/uL (ref 4.20–5.80)
RDW: 12.9 % (ref 11.0–15.0)
Total Lymphocyte: 30.6 %
WBC: 9.1 10*3/uL (ref 3.8–10.8)

## 2022-01-06 LAB — SEDIMENTATION RATE: Sed Rate: 9 mm/h (ref 0–15)

## 2022-01-08 LAB — WOUND CULTURE
MICRO NUMBER:: 13531791
SPECIMEN QUALITY:: ADEQUATE

## 2022-01-08 LAB — CBC WITH DIFFERENTIAL/PLATELET

## 2022-01-08 LAB — COMPREHENSIVE METABOLIC PANEL

## 2022-01-08 LAB — SEDIMENTATION RATE

## 2022-01-11 ENCOUNTER — Encounter: Payer: Self-pay | Admitting: Podiatry

## 2022-01-11 ENCOUNTER — Ambulatory Visit (INDEPENDENT_AMBULATORY_CARE_PROVIDER_SITE_OTHER): Payer: Medicare HMO | Admitting: Podiatry

## 2022-01-11 DIAGNOSIS — L02619 Cutaneous abscess of unspecified foot: Secondary | ICD-10-CM | POA: Diagnosis not present

## 2022-01-11 DIAGNOSIS — L03119 Cellulitis of unspecified part of limb: Secondary | ICD-10-CM

## 2022-01-25 ENCOUNTER — Ambulatory Visit (INDEPENDENT_AMBULATORY_CARE_PROVIDER_SITE_OTHER): Payer: Medicare HMO | Admitting: Podiatry

## 2022-01-25 ENCOUNTER — Ambulatory Visit (INDEPENDENT_AMBULATORY_CARE_PROVIDER_SITE_OTHER): Payer: Medicare HMO

## 2022-01-25 DIAGNOSIS — L02619 Cutaneous abscess of unspecified foot: Secondary | ICD-10-CM | POA: Diagnosis not present

## 2022-01-25 DIAGNOSIS — L03119 Cellulitis of unspecified part of limb: Secondary | ICD-10-CM | POA: Diagnosis not present

## 2022-01-25 DIAGNOSIS — L02612 Cutaneous abscess of left foot: Secondary | ICD-10-CM

## 2022-01-25 DIAGNOSIS — L03116 Cellulitis of left lower limb: Secondary | ICD-10-CM | POA: Diagnosis not present

## 2022-01-25 MED ORDER — DOXYCYCLINE HYCLATE 100 MG PO TABS: 100.0000 mg | ORAL_TABLET | Freq: Two times a day (BID) | ORAL | 1 refills | Status: DC

## 2022-01-25 MED ORDER — CIPROFLOXACIN HCL 500 MG PO TABS: 500.0000 mg | ORAL_TABLET | Freq: Two times a day (BID) | ORAL | 0 refills | Status: AC

## 2022-01-26 ENCOUNTER — Telehealth: Payer: Self-pay | Admitting: Podiatry

## 2022-01-26 ENCOUNTER — Telehealth: Payer: Self-pay

## 2022-01-26 ENCOUNTER — Other Ambulatory Visit: Payer: Self-pay | Admitting: Podiatry

## 2022-01-26 NOTE — Telephone Encounter (Signed)
Pharmacy called 2xs yesterday and twice today. regarding medication Sig: Take 1 tablet (500 mg total) by mouth 2 (two) times daily for 10 days Pharmacy wanted to check if you want once a day or twice?

## 2022-01-26 NOTE — Telephone Encounter (Signed)
Not sure what you are saying. Patient does not usually ask for the prior authorization. I need to know who to contact

## 2022-01-26 NOTE — Telephone Encounter (Signed)
Received call from [place] at [facility] and pt was there and had a lab order for [facility]. She needed verbal authorization to get labs done by [facility]. Ok per Dr Paulla Dolly

## 2022-01-26 NOTE — Telephone Encounter (Signed)
He should keep that appointment

## 2022-01-26 NOTE — Progress Notes (Signed)
Subjective:   Patient ID: Benjamin Carter, male   DOB: 41 y.o.   MRN: [id number]   HPI Patient states he was doing fine and then 6 days ago he started to get swelling and pain began underneath his first metatarsal head left   ROS      Objective:  Physical Exam  Neurovascular status intact with a small area that is discolored on the plantar aspect left first metatarsal no obvious drainage coming from it but there is some swelling that is localized to this area.  I did not note any proximal edema erythema drainage and upon questioning patient does not show indications of systemic infection at the current time     Assessment:  Possibility for abscess plantar aspect left foot as he has noted drainage in the past and it seems to follow-up when he stops antibiotics     Plan:  Reviewed condition rex-ray today at great length.  At this point I anesthetized the forefoot and using a 15 blade I made a small approximate half centimeter incision plantarly.  I did review this case with Dr. Posey Pronto who is seeing him previously and were both in agreement that it appears to be localized but good chance there may be a plantar abscess and I did currently open it up today.  I did note some moderate localized drainage I did culture this today I flushed copious amounts Derica mycin solution and I sent this off for culture and sensitivity testing.  I then went ahead and I did apply sterile dressing and instructed on trying to keep it open and utilize wet-to-dry dressings and soak daily and I dispensed surgical shoe.  I am also sending for MRI to rule out abscess and did explain that ultimately this may require a procedure to completely open up the plantar tissue but at this point organ to try him on 2 antibiotics for more extended time along with the procedure that I did today and we will see whether or not this will solve it and patient was given strict instructions of any increased redness any systemic signs of  infection were to occur he is to go straight to the hospital and contact us.  I also ordered blood work today to understand better whether or not he shows any signs of systemic infection  X-rays left were negative for signs that appears to be any kind of bone infection it appears to be a soft tissue condition current

## 2022-01-27 LAB — CBC WITH DIFFERENTIAL/PLATELET
Absolute Monocytes: 657 cells/uL (ref 200–950)
Basophils Absolute: 72 cells/uL (ref 0–200)
Basophils Relative: 0.8 %
Eosinophils Absolute: 315 cells/uL (ref 15–500)
Eosinophils Relative: 3.5 %
HCT: 43.5 % (ref 38.5–50.0)
Hemoglobin: 14.9 g/dL (ref 13.2–17.1)
Lymphs Abs: 3006 cells/uL (ref 850–3900)
MCH: 32.7 pg (ref 27.0–33.0)
MCHC: 34.3 g/dL (ref 32.0–36.0)
MCV: 95.6 fL (ref 80.0–100.0)
MPV: 10.9 fL (ref 7.5–12.5)
Monocytes Relative: 7.3 %
Neutro Abs: 4950 cells/uL (ref 1500–7800)
Neutrophils Relative %: 55 %
Platelets: 244 10*3/uL (ref 140–400)
RBC: 4.55 10*6/uL (ref 4.20–5.80)
RDW: 12.7 % (ref 11.0–15.0)
Total Lymphocyte: 33.4 %
WBC: 9 10*3/uL (ref 3.8–10.8)

## 2022-01-27 LAB — SEDIMENTATION RATE: Sed Rate: 17 mm/h — ABNORMAL HIGH (ref 0–15)

## 2022-01-28 LAB — WOUND CULTURE
MICRO NUMBER:: 13612328
SPECIMEN QUALITY:: ADEQUATE

## 2022-01-29 ENCOUNTER — Telehealth: Payer: Self-pay | Admitting: Podiatry

## 2022-01-29 ENCOUNTER — Other Ambulatory Visit: Payer: Self-pay | Admitting: Podiatry

## 2022-01-29 MED ORDER — CIPROFLOXACIN HCL 500 MG PO TABS: 500.0000 mg | ORAL_TABLET | Freq: Two times a day (BID) | ORAL | 0 refills | Status: AC

## 2022-01-29 NOTE — Telephone Encounter (Signed)
"  I was supposed to have my MRI today.  But they said Dr. Paulla Dolly put the orders in incorrectly.  They canceled my MRI.  They said they had put a note in the system letting Dr. Paulla Dolly know what needed to be done and all that.  So, I'm calling to find out what is next."  I'll send a message to Dr. Paulla Dolly and his assistant, Mohammed Kindle.  Someone will call you back."

## 2022-01-29 NOTE — Telephone Encounter (Signed)
Pharmacy stated the medication was sent for 30 tabs for 10 days. Medication needs to be sent twice a days for 10 days, 20 tabs.

## 2022-01-29 NOTE — Addendum Note (Signed)
Addended by: Wallene Huh on: 01/29/2022 01:49 PM   Modules accepted: Orders

## 2022-01-29 NOTE — Telephone Encounter (Signed)
Sent in for 20 pills

## 2022-01-29 NOTE — Telephone Encounter (Signed)
I sent in the correct order so they should be contacting you

## 2022-01-29 NOTE — Telephone Encounter (Signed)
Twice a day is fine for the cipro

## 2022-01-31 ENCOUNTER — Ambulatory Visit (HOSPITAL_COMMUNITY): Payer: Medicare HMO

## 2022-02-01 ENCOUNTER — Ambulatory Visit: Payer: Medicare HMO

## 2022-02-21 ENCOUNTER — Ambulatory Visit
Admission: EM | Admit: 2022-02-21 | Discharge: 2022-02-21 | Disposition: A | Discharge status: Home / Self Care | Payer: Medicare HMO | Attending: Nurse Practitioner | Admitting: Nurse Practitioner

## 2022-02-21 ENCOUNTER — Other Ambulatory Visit: Payer: Self-pay

## 2022-02-21 ENCOUNTER — Encounter: Payer: Self-pay | Admitting: Emergency Medicine

## 2022-02-21 DIAGNOSIS — A084 Viral intestinal infection, unspecified: Secondary | ICD-10-CM

## 2022-02-21 MED ORDER — ONDANSETRON 4 MG PO TBDP: 4.0000 mg | ORAL_TABLET | Freq: Three times a day (TID) | ORAL | 0 refills | Status: AC | PRN

## 2022-02-21 NOTE — ED Triage Notes (Addendum)
Pt reports woke up this am with headache, fatigue and reports after eating lunch emesis x2. Pt reports feels like a "knot" in my stomach.  Also needs note for work.

## 2022-02-21 NOTE — ED Provider Notes (Signed)
[facility]    CSN: [id number] Arrival date & time: 02/21/22  1304      History   Chief Complaint Chief Complaint  Patient presents with   Abdominal Pain    HPI Benjamin Carter is a 41 y.o. male.   Patient presents with fatigue and feeling poorly since he woke up this morning.  Reports while on lunch break at work, he vomited up his lunch.  Also endorses a couple of cold chills while at work earlier today.  Denies current abdominal pain, nausea, dysuria/urinary frequency, urgency, diarrhea, blood in stool.  Reports his appetite has been decreased for the past few days but attributes this to working more and the heat.  He has been able to tolerated liquids since vomiting earlier.  Has not taken anything for his symptoms so far.    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  Denies use of medication on a chronic basis.  Has been treated recently for infection in his foot by podiatry.    Past Medical History:  Diagnosis Date   Anxiety    Arthritis    Bipolar 1 disorder ([place])    Depression    History of MRSA infection    left index finger 03-07-2015 w/ positive blood cultures   History of prediabetes    Hyperlipidemia    Left hand pain    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)    Pre-diabetes    Wears glasses     Patient Active Problem List   Diagnosis Date Noted   Prediabetes 04/11/2021   Sesamoiditis of left foot 04/11/2021   Morbid obesity ([place]) 04/04/2021   Cannabis use disorder, moderate, dependence ([place]) 03/23/2019   Tobacco use disorder 03/23/2019   Alcohol use disorder, mild, abuse 03/23/2019   Bipolar I disorder, most recent episode mixed ([place]) 02/16/2019   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/2019   Anxiety disorder 02/16/2019   Hypertension 07/10/2018   Bipolar 1 disorder ([place]) 07/10/2018   Osteomyelitis of finger of left hand ([place])  08/05/2017   Anxiety 03/07/2014   Neuropathy 03/07/2014   History of pancreatitis 03/07/2014    Past Surgical History:  Procedure Laterality Date   AMPUTATION Left 03/10/2014   Procedure: Irrigation and Debridement and Revision of Amputation Left Index Finger;  Surgeon: Linna Hoff, MD;  Location: [facility];  Service: Orthopedics;  Laterality: Left;  Wants to go at [facility] Left 10/21/2014   Procedure: LEFT LONG FINGER AMPUTATION REVISION;  Surgeon: Iran Planas, MD;  Location: [facility];  Service: Orthopedics;  Laterality: Left;   CHOLECYSTECTOMY N/A 12/07/2013   Procedure: LAPAROSCOPIC CHOLECYSTECTOMY;  Surgeon: Jamesetta So, MD;  Location: [facility];  Service: General;  Laterality: N/A;   GALLBLADDER SURGERY  2015   I & D EXTREMITY Left 03/07/2014   Procedure: IRRIGATION AND DEBRIDEMENT INDEX FINGER;  Surgeon: Linna Hoff, MD;  Location: [facility];  Service: Orthopedics;  Laterality: Left;   LUMBAR DISC SURGERY  2004   TOOTH EXTRACTION N/A 06/02/2021   Procedure: DENTAL RESTORATION/EXTRACTIONS;  Surgeon: Diona Browner, DMD;  Location: [facility];  Service: Oral Surgery;  Laterality: N/A;       Home Medications    Prior to Admission medications   Medication Sig Start Date End Date Taking? Authorizing Provider  ondansetron (ZOFRAN-ODT) 4 MG disintegrating tablet Take 1 tablet (4 mg total) by mouth every 8 (eight) hours as needed for  nausea or vomiting. 02/21/22  Yes Eulogio Bear, NP  amoxicillin-clavulanate (AUGMENTIN) 875-125 MG tablet Take 1 tablet by mouth 2 (two) times daily. 01/04/22   Wallene Huh, DPM  cephALEXin (KEFLEX) 500 MG capsule Take 500 mg by mouth 2 (two) times daily. 01/03/22   [provider]  doxycycline (VIBRA-TABS) 100 MG tablet Take 1 tablet (100 mg total) by mouth 2 (two) times daily. 01/25/22   Wallene Huh, DPM  doxycycline (VIBRAMYCIN) 100 MG capsule Take 100 mg by mouth 2 (two) times daily. 01/03/22   [provider]  ibuprofen (ADVIL)  800 MG tablet Take 1 tablet (800 mg total) by mouth every 8 (eight) hours as needed. 06/02/21   Diona Browner, DMD  mupirocin cream (BACTROBAN) 2 % Apply 1 application topically 2 (two) times daily. 05/05/21   Felipa Furnace, DPM  oxyCODONE (OXY IR/ROXICODONE) 5 MG immediate release tablet Take 1 tablet (5 mg total) by mouth every 6 (six) hours as needed. 06/02/21   Diona Browner, DMD  oxyCODONE-acetaminophen (PERCOCET) 10-325 MG tablet Take 1 tablet by mouth every 6 (six) hours as needed for pain.    [provider]  triamcinolone ointment (KENALOG) 0.1 % Apply 1 application. topically 2 (two) times daily. Apply sparingly to affected areas; do not use for more than 2 weeks consecutively 12/25/21   Eulogio Bear, NP    Family History Family History  Problem Relation Age of Onset   Diabetes Mother    Hyperlipidemia Mother    Hypertension Mother    Heart disease Mother    Kidney failure Mother    Neuropathy Mother    Bipolar disorder Mother    Heart attack Father        32, 110 first MI   Cancer Father        colon upper 36's   Bipolar disorder Father    Stroke Maternal Grandmother    Cirrhosis Maternal Grandfather    Mental illness Cousin    Suicidality Cousin     Social History Social History   Tobacco Use   Smoking status: Every Day    Packs/day: 0.50    Years: 20.00    Total pack years: 10.00    Types: Cigarettes   Smokeless tobacco: Never  Vaping Use   Vaping Use: Never used  Substance Use Topics   Alcohol use: Yes    Comment: social infrequently   Drug use: Yes    Frequency: 4.0 times per week    Types: Marijuana     Allergies   Bactrim [sulfamethoxazole-trimethoprim], Sulfites, Doxycycline, and Toradol [ketorolac tromethamine]   Review of Systems Review of Systems Per HPI  Physical Exam Triage Vital Signs ED Triage Vitals [02/21/22 1316]  Enc Vitals Group     BP 114/74     Pulse Rate 74     Resp 20     Temp 98.1 F (36.7 C)     Temp  Source Oral     SpO2 95 %     Weight      Height      Head Circumference      Peak Flow      Pain Score 7     Pain Loc      Pain Edu?      Excl. in [place]?    No data found.  Updated Vital Signs BP 114/74 (BP Location: Right Arm)   Pulse 74   Temp 98.1 F (36.7 C) (Oral)   Resp  20   SpO2 95%   Visual Acuity Right Eye Distance:   Left Eye Distance:   Bilateral Distance:    Right Eye Near:   Left Eye Near:    Bilateral Near:     Physical Exam Vitals and nursing note reviewed.  Constitutional:      General: He is not in acute distress.    Appearance: Normal appearance. He is not toxic-appearing.  HENT:     Head: Normocephalic and atraumatic.     Mouth/Throat:     Mouth: Mucous membranes are moist.     Pharynx: Oropharynx is clear. No posterior oropharyngeal erythema.  Cardiovascular:     Rate and Rhythm: Normal rate and regular rhythm.  Pulmonary:     Effort: Pulmonary effort is normal. No respiratory distress.     Breath sounds: Normal breath sounds. No wheezing, rhonchi or rales.  Abdominal:     General: Abdomen is flat. Bowel sounds are normal. There is no distension.     Palpations: Abdomen is soft.     Tenderness: There is no abdominal tenderness. There is no right CVA tenderness, left CVA tenderness, guarding or rebound. Negative signs include Murphy's sign.  Musculoskeletal:     Cervical back: Normal range of motion.  Lymphadenopathy:     Cervical: No cervical adenopathy.  Skin:    General: Skin is warm and dry.     Capillary Refill: Capillary refill takes less than 2 seconds.     Coloration: Skin is not jaundiced or pale.     Findings: No erythema or rash.  Neurological:     Mental Status: He is alert.     Motor: No weakness.     Gait: Gait normal.  Psychiatric:        Behavior: Behavior is cooperative.      UC Treatments / Results  Labs (all labs ordered are listed, but only abnormal results are displayed) Labs Reviewed - No data to  display  EKG   Radiology No results found.  Procedures Procedures (including critical care time)  Medications Ordered in UC Medications - No data to display  Initial Impression / Assessment and Plan / UC Course  I have reviewed the triage vital signs and the nursing notes.  Pertinent labs & imaging results that were available during my care of the patient were reviewed by me and considered in my medical decision making (see chart for details).    Patient is a well-appearing 41 year old male presenting for vomiting, fatigue, and chills that started earlier today.  No red flags today.  Symptoms are consistent with viral gastroenteritis.  Encouraged pushing hydration, rest at home.  Prescription given for Zofran if nausea/vomiting returns.  Note given for work.  ER precautions discussed.  Final Clinical Impressions(s) / UC Diagnoses   Final diagnoses:  Viral gastroenteritis     Discharge Instructions      - Your symptoms today are consistent with a stomach bug - Please push hydration at home with plenty of liquids and rest - Can take Zofran every 8 hours as needed for nausea/vomiting - Go to ER if symptoms worsen    ED Prescriptions     Medication Sig Dispense Auth. Provider   ondansetron (ZOFRAN-ODT) 4 MG disintegrating tablet Take 1 tablet (4 mg total) by mouth every 8 (eight) hours as needed for nausea or vomiting. 20 tablet Eulogio Bear, NP      PDMP not reviewed this encounter.   Eulogio Bear, NP 02/21/22 1340

## 2022-02-21 NOTE — Discharge Instructions (Signed)
-   Your symptoms today are consistent with a stomach bug - Please push hydration at home with plenty of liquids and rest - Can take Zofran every 8 hours as needed for nausea/vomiting - Go to ER if symptoms worsen

## 2022-03-08 ENCOUNTER — Ambulatory Visit
Admission: EM | Admit: 2022-03-08 | Discharge: 2022-03-08 | Disposition: A | Discharge status: Home / Self Care | Payer: Medicare HMO | Attending: Family Medicine | Admitting: Family Medicine

## 2022-03-08 ENCOUNTER — Encounter: Payer: Self-pay | Admitting: Emergency Medicine

## 2022-03-08 ENCOUNTER — Other Ambulatory Visit: Payer: Self-pay

## 2022-03-08 ENCOUNTER — Telehealth: Payer: Self-pay | Admitting: Emergency Medicine

## 2022-03-08 ENCOUNTER — Ambulatory Visit (INDEPENDENT_AMBULATORY_CARE_PROVIDER_SITE_OTHER): Payer: Medicare HMO

## 2022-03-08 DIAGNOSIS — M25562 Pain in left knee: Secondary | ICD-10-CM

## 2022-03-08 MED ORDER — PREDNISONE 20 MG PO TABS: 40.0000 mg | ORAL_TABLET | Freq: Every day | ORAL | 0 refills | Status: DC

## 2022-03-08 NOTE — Discharge Instructions (Addendum)
Call your pain medicine specialist to inquire about further treatment of your acute knee pain.  Rest and ice your knee for the next few days and follow up with the orthopaedist if not improving.

## 2022-03-08 NOTE — ED Provider Notes (Signed)
[facility]   [id number] 03/08/22 Arrival Time: 4235  ASSESSMENT & PLAN:  1. Acute pain of left knee    I have personally viewed the imaging studies ordered this visit. No acute changes. No fractures appreciated.  Meds ordered this encounter  Medications   predniSONE (DELTASONE) 20 MG tablet    Sig: Take 2 tablets (40 mg total) by mouth daily.    Dispense:  10 tablet    Refill:  0   Orders Placed This Encounter  Procedures   DG Knee Complete 4 Views Left   Work/school excuse note: provided. Recommend:  Follow-up Information     Schedule an appointment as soon as possible for a visit  with [facility].   Specialty: Specialist Contact information: [address] [place] [address] [phone]                  Discharge Instructions      Call your pain medicine specialist to inquire about further treatment of your acute knee pain.  Rest and ice your knee for the next few days and follow up with the orthopaedist if not improving.     Reviewed expectations re: course of current medical issues. Questions answered. Outlined signs and symptoms indicating need for more acute intervention. Patient verbalized understanding. After Visit Summary given.  SUBJECTIVE: History from: patient. Benjamin Carter is a 41 y.o. male who reports persistent marked pain of his left knee; described as stabbing/throbbing; without radiation. Onset: abrupt. First noted: yesterday. Injury/trama: reports hitting left knee against deck; immediate pain. Swelling present today. Aggravating factors: certain movements and weight bearing. Alleviating factors: have not been identified. Associated symptoms: none reported. Extremity sensation changes or weakness: none. Percocet, THC, Goody Powder, ibuprofen PTA; "didn't help much".  Past Surgical History:  Procedure Laterality Date   AMPUTATION Left 03/10/2014   Procedure: Irrigation and Debridement and  Revision of Amputation Left Index Finger;  Surgeon: Linna Hoff, MD;  Location: [facility];  Service: Orthopedics;  Laterality: Left;  Wants to go at [facility] Left 10/21/2014   Procedure: LEFT LONG FINGER AMPUTATION REVISION;  Surgeon: Iran Planas, MD;  [facility];  Service: Orthopedics;  Laterality: Left;   CHOLECYSTECTOMY N/A 12/07/2013   Procedure: LAPAROSCOPIC CHOLECYSTECTOMY;  Surgeon: Jamesetta So, MD;  Location: [facility];  Service: General;  Laterality: N/A;   GALLBLADDER SURGERY  2015   I & D EXTREMITY Left 03/07/2014   Procedure: IRRIGATION AND DEBRIDEMENT INDEX FINGER;  Surgeon: Linna Hoff, MD;  Location: [facility];  Service: Orthopedics;  Laterality: Left;   LUMBAR DISC SURGERY  2004   TOOTH EXTRACTION N/A 06/02/2021   Procedure: DENTAL RESTORATION/EXTRACTIONS;  Surgeon: Diona Browner, DMD;  Location: [facility];  Service: Oral Surgery;  Laterality: N/A;      OBJECTIVE:  Vitals:   03/08/22 1254  BP: 124/76  Pulse: 75  Resp: 20  Temp: 98.6 F (37 C)  TempSrc: Oral  SpO2: 93%    General appearance: alert; no distress HEENT: [place]; AT Neck: supple with FROM Resp: unlabored respirations Extremities: LLE: warm with well perfused appearance; poorly localized marked tenderness over left anterior/superior knee; swelling: moderate; bruising: none; knee ROM: normal, with discomfort CV: brisk extremity capillary refill of LLE; 2+ DP pulse of LLE. Skin: warm and dry; no visible rashes Neurologic: normal sensation and strength of LLE Psychological: alert and cooperative; normal mood and affect  Imaging: DG Knee Complete 4 Views Left  Result Date:  03/08/2022 CLINICAL DATA:  Trauma EXAM: LEFT KNEE - COMPLETE 4+ VIEW COMPARISON:  None Available. FINDINGS: No fracture or dislocation is seen. There is no effusion. Degenerative changes are noted with bony spurs seen medial, lateral and patellofemoral compartments. There is soft tissue swelling anterior to the patella suggesting  contusion/hematoma. IMPRESSION: No recent fracture or dislocation is seen. Degenerative changes are noted. There is prominence of the prepatellar soft tissues suggesting contusion/hematoma. Electronically Signed   By: Elmer Picker M.D.   On: 03/08/2022 13:14      Allergies  Allergen Reactions   Bactrim [Sulfamethoxazole-Trimethoprim]     Caused pancreatitis   Sulfites     Caused elevated liver enzymes   Doxycycline Rash   Toradol [Ketorolac Tromethamine] Rash    Past Medical History:  Diagnosis Date   Anxiety    Arthritis    Bipolar 1 disorder (HCC)    Depression    History of MRSA infection    left index finger 03-07-2015 w/ positive blood cultures   History of prediabetes    Hyperlipidemia    Left hand pain    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)    Pre-diabetes    Wears glasses    Social History   Socioeconomic History   Marital status: Single    Spouse name: Not on file   Number of children: 1   Years of education: Not on file   Highest education level: High school graduate  Occupational History   Not on file  Tobacco Use   Smoking status: Every Day    Packs/day: 0.50    Years: 20.00    Total pack years: 10.00    Types: Cigarettes   Smokeless tobacco: Never  Vaping Use   Vaping Use: Never used  Substance and Sexual Activity   Alcohol use: Yes    Comment: social infrequently   Drug use: Yes    Frequency: 4.0 times per week    Types: Marijuana   Sexual activity: Yes    Birth control/protection: Surgical    Comment: girlfriend had surgery, 37year old girl  Other Topics Concern   Not on file  Social History Narrative   Lives with girlfriend, Anderson [place] and her daughter.    Social Determinants of Health   Financial Resource Strain: Low Risk  (06/21/2021)   Overall Financial Resource Strain (CARDIA)    Difficulty of Paying Living Expenses: Not hard at all  Food Insecurity: No Food Insecurity (06/21/2021)   Hunger Vital Sign    Worried  About Running Out of Food in the Last Year: Never true    Ran Out of Food in the Last Year: Never true  Transportation Needs: No Transportation Needs (06/21/2021)   PRAPARE - Hydrologist (Medical): No    Lack of Transportation (Non-Medical): No  Physical Activity: Sufficiently Active (06/21/2021)   Exercise Vital Sign    Days of Exercise per Week: 5 days    Minutes of Exercise per Session: 30 min  Stress: No Stress Concern Present (06/21/2021)   [place]    Feeling of Stress : Not at all  Social Connections: Moderately Isolated (06/21/2021)   Social Connection and Isolation Panel [NHANES]    Frequency of Communication with Friends and Family: More than three times a week    Frequency of Social Gatherings with Friends and Family: More than three times a week    Attends Religious Services: Never  Active Member of Clubs or Organizations: No    Attends Archivist Meetings: Never    Marital Status: Married   Family History  Problem Relation Age of Onset   Diabetes Mother    Hyperlipidemia Mother    Hypertension Mother    Heart disease Mother    Kidney failure Mother    Neuropathy Mother    Bipolar disorder Mother    Heart attack Father        58, 70 first MI   Cancer Father        colon upper 28's   Bipolar disorder Father    Stroke Maternal Grandmother    Cirrhosis Maternal Grandfather    Mental illness Cousin    Suicidality Cousin    Past Surgical History:  Procedure Laterality Date   AMPUTATION Left 03/10/2014   Procedure: Irrigation and Debridement and Revision of Amputation Left Index Finger;  Surgeon: Linna Hoff, MD;  Location: [facility];  Service: Orthopedics;  Laterality: Left;  Wants to go at [facility] Left 10/21/2014   Procedure: LEFT LONG FINGER AMPUTATION REVISION;  Surgeon: Iran Planas, MD;  [facility];  Service: Orthopedics;  Laterality: Left;    CHOLECYSTECTOMY N/A 12/07/2013   Procedure: LAPAROSCOPIC CHOLECYSTECTOMY;  Surgeon: Jamesetta So, MD;  Location[facility];  Service: General;  Laterality: N/A;   GALLBLADDER SURGERY  2015   I & D EXTREMITY Left 03/07/2014   Procedure: IRRIGATION AND DEBRIDEMENT INDEX FINGER;  Surgeon: Linna Hoff, MD;  Location: [facility];  Service: Orthopedics;  Laterality: Left;   LUMBAR DISC SURGERY  2004   TOOTH EXTRACTION N/A 06/02/2021   Procedure: DENTAL RESTORATION/EXTRACTIONS;  Surgeon: Diona Browner, DMD;  Location: [facility];  Service: Oral Surgery;  Laterality: N/AVanessa Kick, MD 03/08/22 1347

## 2022-03-08 NOTE — ED Triage Notes (Signed)
Pt reports was working on a deck yesterday and reports stood up and right leg gave out and reports hit left knee on a 2x10. Pt reports intense pain ever since. Pt able to bear weight. Pt reports took percocet, marijuana, goody powder,and ibuprofen pta.

## 2022-03-16 ENCOUNTER — Ambulatory Visit
Admission: EM | Admit: 2022-03-16 | Discharge: 2022-03-16 | Disposition: A | Discharge status: Home / Self Care | Payer: Medicare HMO | Attending: Family Medicine | Admitting: Family Medicine

## 2022-03-16 DIAGNOSIS — L03119 Cellulitis of unspecified part of limb: Secondary | ICD-10-CM

## 2022-03-16 MED ORDER — MUPIROCIN 2 % EX OINT: 1.0000 | TOPICAL_OINTMENT | Freq: Two times a day (BID) | CUTANEOUS | 0 refills | Status: AC

## 2022-03-16 MED ORDER — CLINDAMYCIN HCL 300 MG PO CAPS: 300.0000 mg | ORAL_CAPSULE | Freq: Three times a day (TID) | ORAL | 0 refills | Status: AC

## 2022-03-16 MED ORDER — CHLORHEXIDINE GLUCONATE 4 % EX LIQD
Freq: Every day | CUTANEOUS | 0 refills | Status: DC | PRN
Start: 2022-03-16 — End: 2023-02-21

## 2022-03-16 NOTE — ED Triage Notes (Signed)
Pt reports bilateral hands pain, tingling sensation and skin tear x 1 week.

## 2022-03-16 NOTE — ED Provider Notes (Signed)
[facility]    CSN: [id number] Arrival date & time: 03/16/22  1342      History   Chief Complaint Chief Complaint  Patient presents with   Hand Pain    HPI Benjamin Carter is a 41 y.o. male.   Patient presenting today with acute on chronic bilateral hand pain, tingling, swelling now with tearing areas of his skin on the hands which he attributes to his hard physical labor at work.  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  He wears waterproof gloves at work and states he sweats quite often in the gloves.  So far not trying anything over-the-counter for his symptoms.  Denies fever, chills, body aches, sweats.     Past Medical History:  Diagnosis Date   Anxiety    Arthritis    Bipolar 1 disorder ([place])    Depression    History of MRSA infection    left index finger 03-07-2015 w/ positive blood cultures   History of prediabetes    Hyperlipidemia    Left hand pain    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)    Pre-diabetes    Wears glasses     Patient Active Problem List   Diagnosis Date Noted   Prediabetes 04/11/2021   Sesamoiditis of left foot 04/11/2021   Morbid obesity ([place]) 04/04/2021   Cannabis use disorder, moderate, dependence ([place]) 03/23/2019   Tobacco use disorder 03/23/2019   Alcohol use disorder, mild, abuse 03/23/2019   Bipolar I disorder, most recent episode mixed ([place]) 02/16/2019   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/2019   Anxiety disorder 02/16/2019   Hypertension 07/10/2018   Bipolar 1 disorder ([place]) 07/10/2018   Osteomyelitis of finger of left hand ([place]) 08/05/2017   Anxiety 03/07/2014   Neuropathy 03/07/2014   History of pancreatitis 03/07/2014    Past Surgical History:  Procedure Laterality Date   AMPUTATION Left 03/10/2014   Procedure: Irrigation and Debridement and Revision of  Amputation Left Index Finger;  Surgeon: Linna Hoff, MD;  Location: [facility];  Service: Orthopedics;  Laterality: Left;  Wants to go at [place] Left 10/21/2014   Procedure: LEFT LONG FINGER AMPUTATION REVISION;  Surgeon: Iran Planas, MD;  Location: [facility];  Service: Orthopedics;  Laterality: Left;   CHOLECYSTECTOMY N/A 12/07/2013   Procedure: LAPAROSCOPIC CHOLECYSTECTOMY;  Surgeon: Jamesetta So, MD;  Location: [facility];  Service: General;  Laterality: N/A;   GALLBLADDER SURGERY  2015   I & D EXTREMITY Left 03/07/2014   Procedure: IRRIGATION AND DEBRIDEMENT INDEX FINGER;  Surgeon: Linna Hoff, MD;  Location: [facility];  Service: Orthopedics;  Laterality: Left;   LUMBAR DISC SURGERY  2004   TOOTH EXTRACTION N/A 06/02/2021   Procedure: DENTAL RESTORATION/EXTRACTIONS;  Surgeon: Diona Browner, DMD;  Location: [facility];  Service: Oral Surgery;  Laterality: N/A;       Home Medications    Prior to Admission medications   Medication Sig Start Date End Date Taking? Authorizing Provider  chlorhexidine (HIBICLENS) 4 % external liquid Apply topically daily as needed. 03/16/22  Yes Volney American, PA-C  clindamycin (CLEOCIN) 300 MG capsule Take 1 capsule (300 mg total) by mouth 3 (three) times daily. 03/16/22  Yes Volney American, PA-C  mupirocin ointment (BACTROBAN) 2 % Apply 1 Application  topically 2 (two) times daily. 03/16/22  Yes Volney American, PA-C  amoxicillin-clavulanate (AUGMENTIN) 875-125 MG tablet Take 1 tablet by mouth 2 (two) times daily. 01/04/22   Wallene Huh, DPM  cephALEXin (KEFLEX) 500 MG capsule Take 500 mg by mouth 2 (two) times daily. 01/03/22   [provider]  doxycycline (VIBRA-TABS) 100 MG tablet Take 1 tablet (100 mg total) by mouth 2 (two) times daily. 01/25/22   Wallene Huh, DPM  doxycycline (VIBRAMYCIN) 100 MG capsule Take 100 mg by mouth 2 (two) times daily. 01/03/22   [provider]  ibuprofen (ADVIL) 800 MG tablet Take 1  tablet (800 mg total) by mouth every 8 (eight) hours as needed. 06/02/21   Diona Browner, DMD  mupirocin cream (BACTROBAN) 2 % Apply 1 application topically 2 (two) times daily. 05/05/21   Felipa Furnace, DPM  ondansetron (ZOFRAN-ODT) 4 MG disintegrating tablet Take 1 tablet (4 mg total) by mouth every 8 (eight) hours as needed for nausea or vomiting. 02/21/22   Eulogio Bear, NP  oxyCODONE (OXY IR/ROXICODONE) 5 MG immediate release tablet Take 1 tablet (5 mg total) by mouth every 6 (six) hours as needed. 06/02/21   Diona Browner, DMD  oxyCODONE-acetaminophen (PERCOCET) 10-325 MG tablet Take 1 tablet by mouth every 6 (six) hours as needed for pain.    [provider]  predniSONE (DELTASONE) 20 MG tablet Take 2 tablets (40 mg total) by mouth daily. 03/08/22   Vanessa Kick, MD  triamcinolone ointment (KENALOG) 0.1 % Apply 1 application. topically 2 (two) times daily. Apply sparingly to affected areas; do not use for more than 2 weeks consecutively 12/25/21   Eulogio Bear, NP    Family History Family History  Problem Relation Age of Onset   Diabetes Mother    Hyperlipidemia Mother    Hypertension Mother    Heart disease Mother    Kidney failure Mother    Neuropathy Mother    Bipolar disorder Mother    Heart attack Father        24, 47 first MI   Cancer Father        colon upper 32's   Bipolar disorder Father    Stroke Maternal Grandmother    Cirrhosis Maternal Grandfather    Mental illness Cousin    Suicidality Cousin     Social History Social History   Tobacco Use   Smoking status: Every Day    Packs/day: 0.50    Years: 20.00    Total pack years: 10.00    Types: Cigarettes   Smokeless tobacco: Never  Vaping Use   Vaping Use: Never used  Substance Use Topics   Alcohol use: Yes    Comment: social infrequently   Drug use: Yes    Frequency: 4.0 times per week    Types: Marijuana     Allergies   Bactrim [sulfamethoxazole-trimethoprim], Sulfites,  Doxycycline, and Toradol [ketorolac tromethamine]   Review of Systems Review of Systems PER HPI  Physical Exam Triage Vital Signs ED Triage Vitals  Enc Vitals Group     BP 03/16/22 1448 136/73     Pulse Rate 03/16/22 1448 83     Resp 03/16/22 1448 20     Temp 03/16/22 1448 98 F (36.7 C)     Temp Source 03/16/22 1448 Oral     SpO2 03/16/22 1448 97 %     Weight --      Height --      Head Circumference --  Peak Flow --      Pain Score 03/16/22 1450 8     Pain Loc --      Pain Edu? --      Excl. in [place]? --    No data found.  Updated Vital Signs BP 136/73 (BP Location: Right Arm)   Pulse 83   Temp 98 F (36.7 C) (Oral)   Resp 20   SpO2 97%   Visual Acuity Right Eye Distance:   Left Eye Distance:   Bilateral Distance:    Right Eye Near:   Left Eye Near:    Bilateral Near:     Physical Exam Vitals and nursing note reviewed.  Constitutional:      Appearance: Normal appearance.  HENT:     Head: Atraumatic.     Mouth/Throat:     Mouth: Mucous membranes are moist.  Eyes:     Extraocular Movements: Extraocular movements intact.     Conjunctiva/sclera: Conjunctivae normal.  Cardiovascular:     Rate and Rhythm: Normal rate and regular rhythm.  Pulmonary:     Effort: Pulmonary effort is normal.     Breath sounds: Normal breath sounds.  Musculoskeletal:        General: Swelling and tenderness present. Normal range of motion.     Cervical back: Normal range of motion and neck supple.  Skin:    General: Skin is warm and dry.     Findings: Erythema present.     Comments: Significant calluses, skin tears to bilateral palmar surfaces of hands.  Warmth, mild edema and areas of skin tears  Neurological:     Mental Status: He is oriented to person, place, and time.     Motor: No weakness.     Comments: Bilateral hands neurovascularly intact  Psychiatric:        Mood and Affect: Mood normal.        Thought Content: Thought content normal.        Judgment:  Judgment normal.      UC Treatments / Results  Labs (all labs ordered are listed, but only abnormal results are displayed) Labs Reviewed - No data to display  EKG   Radiology No results found.  Procedures Procedures (including critical care time)  Medications Ordered in UC Medications - No data to display  Initial Impression / Assessment and Plan / UC Course  I have reviewed the triage vital signs and the nursing notes.  Pertinent labs & imaging results that were available during my care of the patient were reviewed by me and considered in my medical decision making (see chart for details).     Given his history of MRSA in his hands and skin tears, as well as his work environment being very dirty and with his hand sweating and gloves all day will cover with course of clindamycin, Hibiclens, mupirocin ointment and follow-up closely with primary care provider.  Return for any worsening symptoms.  Final Clinical Impressions(s) / UC Diagnoses   Final diagnoses:  Cellulitis of upper extremity, unspecified laterality   Discharge Instructions   None    ED Prescriptions     Medication Sig Dispense Auth. Provider   clindamycin (CLEOCIN) 300 MG capsule Take 1 capsule (300 mg total) by mouth 3 (three) times daily. 30 capsule Volney American, [place]   chlorhexidine (HIBICLENS) 4 % external liquid Apply topically daily as needed. 120 mL Volney American, PA-C   mupirocin ointment (BACTROBAN) 2 % Apply 1 Application topically 2 (two)  times daily. 22 g Volney American, [place]      PDMP not reviewed this encounter.   Volney American, [place] 03/16/22 [phone]

## 2022-08-15 ENCOUNTER — Ambulatory Visit (INDEPENDENT_AMBULATORY_CARE_PROVIDER_SITE_OTHER): Payer: Medicare HMO

## 2022-08-15 VITALS — Ht 78.0 in | Wt 280.0 lb

## 2022-08-15 DIAGNOSIS — Z0000 Encounter for general adult medical examination without abnormal findings: Secondary | ICD-10-CM

## 2022-08-15 NOTE — Patient Instructions (Signed)
[name] , Thank you for taking time to come for your Medicare Wellness Visit. I appreciate your ongoing commitment to your health goals. Please review the following plan we discussed and let me know if I can assist you in the future.   These are the goals we discussed:  Goals      Exercise 3x per week (30 min per time)     Continue to exercise daily.     Medicare Wellness     02/05/2019 AWV Goal: Tobacco Cessation  Smoking cessation instruction/counseling given:  counseled patient on the dangers of tobacco use, advised patient to stop smoking, and reviewed strategies to maximize success  Patient will verbalize understanding of the health risks associated with smoking/tobacco use Lung cancer or lung disease, such as COPD Heart disease. Stroke. Heart attack Infertility Osteoporosis and bone fractures. Patient will create a plan to quit smoking/using tobacco Pick a date to quit.  Write down the reasons why you are quitting and put it where you will see it often. Identify the people, places, things, and activities that make you want to smoke (triggers) and avoid them. Make sure to take these actions: Throw away all cigarettes at home, at work, and in your car. Throw away smoking accessories, such as Scientist, research (medical). Clean your car and make sure to empty the ashtray. Clean your home, including curtains and carpets. Tell your family, friends, and coworkers that you are quitting. Support from your loved ones can make quitting easier. Talk with your health care provider about your options for quitting smoking. Find out what treatment options are covered by your health insurance. Patient will be able to demonstrate knowledge of tobacco cessation strategies that may maximize success Quitting "cold [place]" is more successful than gradually quitting. Attending in-person counseling to help you build problem-solving skills.  Finding resources and support systems that can help you to  quit smoking and remain smoke-free after you quit. These resources are most helpful when you use them often. They can include: Online chats with a Social worker. Telephone quitlines. Printed Furniture conservator/restorer. Support groups or group counseling. Text messaging programs. Mobile phone applications. Taking medicines to help you quit smoking: Nicotine patches, gum, or lozenges. Nicotine inhalers or sprays. Non-nicotine medicine that is taken by mouth. Patient will note get discouraged if the process is difficult Over the next year, patient will stop smoking or using other forms of tobacco  Smoking cessation instruction/counseling given:  counseled patient on the dangers of tobacco use, advised patient to stop smoking, and reviewed strategies to maximize success          This is a list of the screening recommended for you and due dates:  Health Maintenance  Topic Date Due   COVID-19 Vaccine (1) Never done   HIV Screening  Never done   Hepatitis C Screening: USPSTF Recommendation to screen - Ages 100-79 yo.  Never done   Flu Shot  Never done   Medicare Annual Wellness Visit  08/16/2023   DTaP/Tdap/Td vaccine (2 - Td or Tdap) 04/30/2031   HPV Vaccine  Aged Out    Advanced directives: Advance directive discussed with you today. I have provided a copy for you to complete at home and have notarized. Once this is complete please bring a copy in to our office so we can scan it into your chart.   Conditions/risks identified: Aim for 30 minutes of exercise or brisk walking, 6-8 glasses of water, and 5 servings of fruits and vegetables each  day.   Next appointment: Follow up in one year for your annual wellness visit   Preventive Care 40-64 Years, Male Preventive care refers to lifestyle choices and visits with your health care provider that can promote health and wellness. What does preventive care include? A yearly physical exam. This is also called an annual well check. Dental exams once or  twice a year. Routine eye exams. Ask your health care provider how often you should have your eyes checked. Personal lifestyle choices, including: Daily care of your teeth and gums. Regular physical activity. Eating a healthy diet. Avoiding tobacco and drug use. Limiting alcohol use. Practicing safe sex. Taking low-dose aspirin every day starting at age 25. What happens during an annual well check? The services and screenings done by your health care provider during your annual well check will depend on your age, overall health, lifestyle risk factors, and family history of disease. Counseling  Your health care provider may ask you questions about your: Alcohol use. Tobacco use. Drug use. Emotional well-being. Home and relationship well-being. Sexual activity. Eating habits. Work and work Statistician. Screening  You may have the following tests or measurements: Height, weight, and BMI. Blood pressure. Lipid and cholesterol levels. These may be checked every 5 years, or more frequently if you are over 60 years old. Skin check. Lung cancer screening. You may have this screening every year starting at age 13 if you have a 30-pack-year history of smoking and currently smoke or have quit within the past 15 years. Fecal occult blood test (FOBT) of the stool. You may have this test every year starting at age 8. Flexible sigmoidoscopy or colonoscopy. You may have a sigmoidoscopy every 5 years or a colonoscopy every 10 years starting at age 84. Prostate cancer screening. Recommendations will vary depending on your family history and other risks. Hepatitis C blood test. Hepatitis B blood test. Sexually transmitted disease (STD) testing. Diabetes screening. This is done by checking your blood sugar (glucose) after you have not eaten for a while (fasting). You may have this done every 1-3 years. Discuss your test results, treatment options, and if necessary, the need for more tests with your  health care provider. Vaccines  Your health care provider may recommend certain vaccines, such as: Influenza vaccine. This is recommended every year. Tetanus, diphtheria, and acellular pertussis (Tdap, Td) vaccine. You may need a Td booster every 10 years. Zoster vaccine. You may need this after age 43. Pneumococcal 13-valent conjugate (PCV13) vaccine. You may need this if you have certain conditions and have not been vaccinated. Pneumococcal polysaccharide (PPSV23) vaccine. You may need one or two doses if you smoke cigarettes or if you have certain conditions. Talk to your health care provider about which screenings and vaccines you need and how often you need them. This information is not intended to replace advice given to you by your health care provider. Make sure you discuss any questions you have with your health care provider. Document Released: 08/05/2015 Document Revised: 03/28/2016 Document Reviewed: 05/10/2015 Elsevier Interactive Patient Education  2017 [place] Prevention in the Home Falls can cause injuries. They can happen to people of all ages. There are many things you can do to make your home safe and to help prevent falls. What can I do on the outside of my home? Regularly fix the edges of walkways and driveways and fix any cracks. Remove anything that might make you trip as you walk through a door, such as a  raised step or threshold. Trim any bushes or trees on the path to your home. Use bright outdoor lighting. Clear any walking paths of anything that might make someone trip, such as rocks or tools. Regularly check to see if handrails are loose or broken. Make sure that both sides of any steps have handrails. Any raised decks and porches should have guardrails on the edges. Have any leaves, snow, or ice cleared regularly. Use sand or salt on walking paths during winter. Clean up any spills in your garage right away. This includes oil or grease spills. What  can I do in the bathroom? Use night lights. Install grab bars by the toilet and in the tub and shower. Do not use towel bars as grab bars. Use non-skid mats or decals in the tub or shower. If you need to sit down in the shower, use a plastic, non-slip stool. Keep the floor dry. Clean up any water that spills on the floor as soon as it happens. Remove soap buildup in the tub or shower regularly. Attach bath mats securely with double-sided non-slip rug tape. Do not have throw rugs and other things on the floor that can make you trip. What can I do in the bedroom? Use night lights. Make sure that you have a light by your bed that is easy to reach. Do not use any sheets or blankets that are too big for your bed. They should not hang down onto the floor. Have a firm chair that has side arms. You can use this for support while you get dressed. Do not have throw rugs and other things on the floor that can make you trip. What can I do in the kitchen? Clean up any spills right away. Avoid walking on wet floors. Keep items that you use a lot in easy-to-reach places. If you need to reach something above you, use a strong step stool that has a grab bar. Keep electrical cords out of the way. Do not use floor polish or wax that makes floors slippery. If you must use wax, use non-skid floor wax. Do not have throw rugs and other things on the floor that can make you trip. What can I do with my stairs? Do not leave any items on the stairs. Make sure that there are handrails on both sides of the stairs and use them. Fix handrails that are broken or loose. Make sure that handrails are as long as the stairways. Check any carpeting to make sure that it is firmly attached to the stairs. Fix any carpet that is loose or worn. Avoid having throw rugs at the top or bottom of the stairs. If you do have throw rugs, attach them to the floor with carpet tape. Make sure that you have a light switch at the top of the  stairs and the bottom of the stairs. If you do not have them, ask someone to add them for you. What else can I do to help prevent falls? Wear shoes that: Do not have high heels. Have rubber bottoms. Are comfortable and fit you well. Are closed at the toe. Do not wear sandals. If you use a stepladder: Make sure that it is fully opened. Do not climb a closed stepladder. Make sure that both sides of the stepladder are locked into place. Ask someone to hold it for you, if possible. Clearly mark and make sure that you can see: Any grab bars or handrails. First and last steps. Where the edge of  each step is. Use tools that help you move around (mobility aids) if they are needed. These include: Canes. Walkers. Scooters. Crutches. Turn on the lights when you go into a dark area. Replace any light bulbs as soon as they burn out. Set up your furniture so you have a clear path. Avoid moving your furniture around. If any of your floors are uneven, fix them. If there are any pets around you, be aware of where they are. Review your medicines with your doctor. Some medicines can make you feel dizzy. This can increase your chance of falling. Ask your doctor what other things that you can do to help prevent falls. This information is not intended to replace advice given to you by your health care provider. Make sure you discuss any questions you have with your health care provider. Document Released: 05/05/2009 Document Revised: 12/15/2015 Document Reviewed: 08/13/2014 Elsevier Interactive Patient Education  2017 [employer].

## 2022-08-15 NOTE — Progress Notes (Signed)
Subjective:   Benjamin DISSINGER is a 42 y.o. male who presents for Medicare Annual/Subsequent preventive examination. I connected with  Benjamin Carter on 08/15/22 by a audio enabled telemedicine application and verified that I am speaking with the correct person using two identifiers.  Patient Location: Home  Provider Location: Home Office  I discussed the limitations of evaluation and management by telemedicine. The patient expressed understanding and agreed to proceed.  Review of Systems     Cardiac Risk Factors include: none     Objective:    Today's Vitals   08/15/22 0820  Weight: 280 lb (127 kg)  Height: '6\' 6"'$  (1.981 m)   Body mass index is 32.36 kg/m.     08/15/2022    8:25 AM 06/21/2021    8:37 AM 06/02/2021    6:26 AM 05/31/2021    5:55 PM 08/29/2020    8:07 AM 12/31/2019   11:53 AM 02/05/2019    9:59 AM  Advanced Directives  Does Patient Have a Medical Advance Directive? No No No No No No No  Would patient like information on creating a medical advance directive? No - Patient declined No - Patient declined No - Patient declined No - Patient declined   No - Patient declined    Current Medications (verified) Outpatient Encounter Medications as of 08/15/2022  Medication Sig   ibuprofen (ADVIL) 800 MG tablet Take 1 tablet (800 mg total) by mouth every 8 (eight) hours as needed.   ondansetron (ZOFRAN-ODT) 4 MG disintegrating tablet Take 1 tablet (4 mg total) by mouth every 8 (eight) hours as needed for nausea or vomiting.   oxyCODONE (OXY IR/ROXICODONE) 5 MG immediate release tablet Take 1 tablet (5 mg total) by mouth every 6 (six) hours as needed.   oxyCODONE-acetaminophen (PERCOCET) 10-325 MG tablet Take 1 tablet by mouth every 6 (six) hours as needed for pain.   amoxicillin-clavulanate (AUGMENTIN) 875-125 MG tablet Take 1 tablet by mouth 2 (two) times daily. (Patient not taking: Reported on 08/15/2022)   cephALEXin (KEFLEX) 500 MG capsule Take 500 mg by mouth 2 (two)  times daily. (Patient not taking: Reported on 08/15/2022)   chlorhexidine (HIBICLENS) 4 % external liquid Apply topically daily as needed. (Patient not taking: Reported on 08/15/2022)   clindamycin (CLEOCIN) 300 MG capsule Take 1 capsule (300 mg total) by mouth 3 (three) times daily. (Patient not taking: Reported on 08/15/2022)   doxycycline (VIBRA-TABS) 100 MG tablet Take 1 tablet (100 mg total) by mouth 2 (two) times daily. (Patient not taking: Reported on 08/15/2022)   doxycycline (VIBRAMYCIN) 100 MG capsule Take 100 mg by mouth 2 (two) times daily. (Patient not taking: Reported on 08/15/2022)   mupirocin cream (BACTROBAN) 2 % Apply 1 application topically 2 (two) times daily. (Patient not taking: Reported on 08/15/2022)   mupirocin ointment (BACTROBAN) 2 % Apply 1 Application topically 2 (two) times daily. (Patient not taking: Reported on 08/15/2022)   predniSONE (DELTASONE) 20 MG tablet Take 2 tablets (40 mg total) by mouth daily. (Patient not taking: Reported on 08/15/2022)   triamcinolone ointment (KENALOG) 0.1 % Apply 1 application. topically 2 (two) times daily. Apply sparingly to affected areas; do not use for more than 2 weeks consecutively (Patient not taking: Reported on 08/15/2022)   No facility-administered encounter medications on file as of 08/15/2022.    Allergies (verified) Bactrim [sulfamethoxazole-trimethoprim], Sulfites, Doxycycline, and Toradol [ketorolac tromethamine]   History: Past Medical History:  Diagnosis Date   Anxiety    Arthritis  Bipolar 1 disorder ([place])    Depression    History of MRSA infection    left index finger 03-07-2015 w/ positive blood cultures   History of prediabetes    Hyperlipidemia    Left hand pain    Osteomyelitis of finger of left hand ([place])    Pre-diabetes    Wears glasses    Past Surgical History:  Procedure Laterality Date   AMPUTATION Left 03/10/2014   Procedure: Irrigation and Debridement and Revision of Amputation Left Index Finger;   Surgeon: Linna Hoff, MD;  Location: [facility];  Service: Orthopedics;  Laterality: Left;  Wants to go at [facility] Left 10/21/2014   Procedure: LEFT LONG FINGER AMPUTATION REVISION;  Surgeon: Iran Planas, MD;  Location: [facility];  Service: Orthopedics;  Laterality: Left;   CHOLECYSTECTOMY N/A 12/07/2013   Procedure: LAPAROSCOPIC CHOLECYSTECTOMY;  Surgeon: Jamesetta So, MD;  Location: [facility];  Service: General;  Laterality: N/A;   GALLBLADDER SURGERY  2015   I & D EXTREMITY Left 03/07/2014   Procedure: IRRIGATION AND DEBRIDEMENT INDEX FINGER;  Surgeon: Linna Hoff, MD;  Location: [facility];  Service: Orthopedics;  Laterality: Left;   LUMBAR DISC SURGERY  2004   TOOTH EXTRACTION N/A 06/02/2021   Procedure: DENTAL RESTORATION/EXTRACTIONS;  Surgeon: Diona Browner, DMD;  Location: [facility];  Service: Oral Surgery;  Laterality: N/A;   Family History  Problem Relation Age of Onset   Diabetes Mother    Hyperlipidemia Mother    Hypertension Mother    Heart disease Mother    Kidney failure Mother    Neuropathy Mother    Bipolar disorder Mother    Heart attack Father        63, 44 first MI   Cancer Father        colon upper 46's   Bipolar disorder Father    Stroke Maternal Grandmother    Cirrhosis Maternal Grandfather    Mental illness Cousin    Suicidality Cousin    Social History   Socioeconomic History   Marital status: Single    Spouse name: Not on file   Number of children: 1   Years of education: Not on file   Highest education level: High school graduate  Occupational History   Not on file  Tobacco Use   Smoking status: Every Day    Packs/day: 0.50    Years: 20.00    Total pack years: 10.00    Types: Cigarettes   Smokeless tobacco: Never  Vaping Use   Vaping Use: Never used  Substance and Sexual Activity   Alcohol use: Yes    Comment: social infrequently   Drug use: Yes    Frequency: 4.0 times per week    Types: Marijuana   Sexual activity: Yes    Birth  control/protection: Surgical    Comment: girlfriend had surgery, 47year old girl  Other Topics Concern   Not on file  Social History Narrative   Lives with girlfriend, Anderson [place] and her daughter.    Social Determinants of Health   Financial Resource Strain: Low Risk  (08/15/2022)   Overall Financial Resource Strain (CARDIA)    Difficulty of Paying Living Expenses: Not hard at all  Food Insecurity: No Food Insecurity (08/15/2022)   Hunger Vital Sign    Worried About Running Out of Food in the Last Year: Never true    Ran Out of Food in the Last Year: Never true  Transportation Needs: No Transportation Needs (08/15/2022)  PRAPARE - Hydrologist (Medical): No    Lack of Transportation (Non-Medical): No  Physical Activity: Sufficiently Active (08/15/2022)   Exercise Vital Sign    Days of Exercise per Week: 3 days    Minutes of Exercise per Session: 60 min  Stress: No Stress Concern Present (08/15/2022)   White    Feeling of Stress : Not at all  Social Connections: Socially Isolated (08/15/2022)   Social Connection and Isolation Panel [NHANES]    Frequency of Communication with Friends and Family: More than three times a week    Frequency of Social Gatherings with Friends and Family: More than three times a week    Attends Religious Services: Never    Marine scientist or Organizations: No    Attends Music therapist: Never    Marital Status: Never married    Tobacco Counseling Ready to quit: No Counseling given: Not Answered   Clinical Intake:  Pre-visit preparation completed: Yes  Pain : No/denies pain     Nutritional Risks: None Diabetes: No  How often do you need to have someone help you when you read instructions, pamphlets, or other written materials from your doctor or pharmacy?: 1 - Never  Diabetic?no   Interpreter Needed?: No  Information  entered by :: Jadene Pierini, LPN   Activities of Daily Living    08/15/2022    8:26 AM  In your present state of health, do you have any difficulty performing the following activities:  Hearing? 0  Vision? 0  Difficulty concentrating or making decisions? 0  Walking or climbing stairs? 0  Dressing or bathing? 0  Doing errands, shopping? 0  Preparing Food and eating ? N  Using the Toilet? N  In the past six months, have you accidently leaked urine? N  Do you have problems with loss of bowel control? N  Managing your Medications? N  Managing your Finances? N  Housekeeping or managing your Housekeeping? N    Patient Care Team: Baruch Gouty, FNP as PCP - General (Family Medicine)  Indicate any recent Medical Services you may have received from other than [facility] providers in the past year (date may be approximate).     Assessment:   This is a routine wellness examination for Clerance.  Hearing/Vision screen Vision Screening - Comments:: Wears rx glasses - up to date with routine eye exams with  [facility]  Dietary issues and exercise activities discussed: Current Exercise Habits: Home exercise routine, Type of exercise: walking, Time (Minutes): 60, Frequency (Times/Week): 3, Weekly Exercise (Minutes/Week): 180, Intensity: Mild, Exercise limited by: orthopedic condition(s)   Goals Addressed             This Visit's Progress    Exercise 3x per week (30 min per time)   On track    Continue to exercise daily.       Depression Screen    08/15/2022    8:24 AM 06/21/2021    8:32 AM 04/11/2021   10:07 AM 04/11/2021   10:03 AM 04/04/2021   10:05 AM 02/05/2019   10:00 AM 08/14/2018    8:44 AM  PHQ 2/9 Scores  PHQ - 2 Score 0 0 0 0 0 2 1  PHQ- 9 Score   0 0 0 7     Fall Risk    08/15/2022    8:21 AM 06/21/2021    8:38 AM 04/11/2021  10:02 AM 04/04/2021   10:05 AM 02/05/2019   10:00 AM  Fall Risk   Falls in the past year? 0 0 0 0 0  Number falls in past yr: 0 0      Injury with Fall? 0 0     Risk for fall due to : No Fall Risks Impaired mobility     Follow up Falls prevention discussed Falls prevention discussed       FALL RISK PREVENTION PERTAINING TO THE HOME:  Any stairs in or around the home? No  If so, are there any without handrails? No  Home free of loose throw rugs in walkways, pet beds, electrical cords, etc? Yes  Adequate lighting in your home to reduce risk of falls? Yes   ASSISTIVE DEVICES UTILIZED TO PREVENT FALLS:  Life alert? No  Use of a cane, walker or w/c? No  Grab bars in the bathroom? Yes  Shower chair or bench in shower? Yes  Elevated toilet seat or a handicapped toilet? Yes          08/15/2022    8:26 AM 06/21/2021    8:40 AM 02/05/2019   10:03 AM  6CIT Screen  What Year? 0 points 0 points 0 points  What month? 0 points 0 points 0 points  What time? 0 points 0 points 0 points  Count back from 20 0 points 0 points 0 points  Months in reverse 0 points 0 points 0 points  Repeat phrase 0 points 0 points 0 points  Total Score 0 points 0 points 0 points    Immunizations Immunization History  Administered Date(s) Administered   Tdap 04/29/2021    TDAP status: Up to date  Flu Vaccine status: Declined, Education has been provided regarding the importance of this vaccine but patient still declined. Advised may receive this vaccine at local pharmacy or Health Dept. Aware to provide a copy of the vaccination record if obtained from local pharmacy or Health Dept. Verbalized acceptance and understanding.  Pneumococcal vaccine status: Declined,  Education has been provided regarding the importance of this vaccine but patient still declined. Advised may receive this vaccine at local pharmacy or Health Dept. Aware to provide a copy of the vaccination record if obtained from local pharmacy or Health Dept. Verbalized acceptance and understanding.   Covid-19 vaccine status: Declined, Education has been provided regarding the  importance of this vaccine but patient still declined. Advised may receive this vaccine at local pharmacy or Health Dept.or vaccine clinic. Aware to provide a copy of the vaccination record if obtained from local pharmacy or Health Dept. Verbalized acceptance and understanding.  Qualifies for Shingles Vaccine? No   Zostavax completed No   Shingrix Completed?: No.    Education has been provided regarding the importance of this vaccine. Patient has been advised to call insurance company to determine out of pocket expense if they have not yet received this vaccine. Advised may also receive vaccine at local pharmacy or Health Dept. Verbalized acceptance and understanding.  Screening Tests Health Maintenance  Topic Date Due   COVID-19 Vaccine (1) Never done   HIV Screening  Never done   Hepatitis C Screening  Never done   INFLUENZA VACCINE  Never done   Medicare Annual Wellness (AWV)  08/16/2023   DTaP/Tdap/Td (2 - Td or Tdap) 04/30/2031   HPV VACCINES  Aged Out    Health Maintenance  Health Maintenance Due  Topic Date Due   COVID-19 Vaccine (1) Never done  HIV Screening  Never done   Hepatitis C Screening  Never done   INFLUENZA VACCINE  Never done    Colorectal cancer screening: Referral to GI placed declined. Pt aware the office will call re: appt.  Lung Cancer Screening: (Low Dose CT Chest recommended if Age 50-80 years, 30 pack-year currently smoking OR have quit w/in 15years.) does not qualify.   Lung Cancer Screening Referral: n/a  Additional Screening:  Hepatitis C Screening: does not qualify;   Vision Screening: Recommended annual ophthalmology exams for early detection of glaucoma and other disorders of the eye. Is the patient up to date with their annual eye exam?  Yes  Who is the provider or what is the name of the office in which the patient attends annual eye exams? [facility]  If pt is not established with a provider, would they like to be referred to a provider  to establish care? No .   Dental Screening: Recommended annual dental exams for proper oral hygiene  Community Resource Referral / Chronic Care Management: CRR required this visit?  No   CCM required this visit?  No      Plan:     I have personally reviewed and noted the following in the patient's chart:   Medical and social history Use of alcohol, tobacco or illicit drugs  Current medications and supplements including opioid prescriptions. Patient is not currently taking opioid prescriptions. Functional ability and status Nutritional status Physical activity Advanced directives List of other physicians Hospitalizations, surgeries, and ER visits in previous 12 months Vitals Screenings to include cognitive, depression, and falls Referrals and appointments  In addition, I have reviewed and discussed with patient certain preventive protocols, quality metrics, and best practice recommendations. A written personalized care plan for preventive services as well as general preventive health recommendations were provided to patient.     Daphane Shepherd, LPN   7/90/2409   Nurse Notes: Due Flu Vaccine

## 2022-10-16 ENCOUNTER — Telehealth: Payer: Self-pay | Admitting: Family Medicine

## 2022-10-16 NOTE — Telephone Encounter (Signed)
Contacted Benjamin Carter to schedule their annual wellness visit. Appointment made for 08/19/2023.  Thank you,  [name],  [facility] Direct Dial ??[phone]

## 2023-02-20 ENCOUNTER — Encounter (HOSPITAL_COMMUNITY): Payer: Self-pay

## 2023-02-20 ENCOUNTER — Emergency Department (HOSPITAL_COMMUNITY)
Admission: EM | Admit: 2023-02-20 | Discharge: 2023-02-20 | Disposition: A | Discharge status: Home / Self Care | Payer: Medicare HMO | Attending: Emergency Medicine | Admitting: Emergency Medicine

## 2023-02-20 ENCOUNTER — Emergency Department (HOSPITAL_COMMUNITY): Payer: Medicare HMO

## 2023-02-20 ENCOUNTER — Other Ambulatory Visit: Payer: Self-pay

## 2023-02-20 DIAGNOSIS — M79602 Pain in left arm: Secondary | ICD-10-CM | POA: Insufficient documentation

## 2023-02-20 DIAGNOSIS — R0602 Shortness of breath: Secondary | ICD-10-CM | POA: Insufficient documentation

## 2023-02-20 DIAGNOSIS — R079 Chest pain, unspecified: Secondary | ICD-10-CM | POA: Diagnosis present

## 2023-02-20 DIAGNOSIS — D72829 Elevated white blood cell count, unspecified: Secondary | ICD-10-CM | POA: Diagnosis not present

## 2023-02-20 DIAGNOSIS — R7303 Prediabetes: Secondary | ICD-10-CM | POA: Diagnosis not present

## 2023-02-20 LAB — CBC
HCT: 45.6 % (ref 39.0–52.0)
Hemoglobin: 15.6 g/dL (ref 13.0–17.0)
MCH: 32.2 pg (ref 26.0–34.0)
MCHC: 34.2 g/dL (ref 30.0–36.0)
MCV: 94 fL (ref 80.0–100.0)
Platelets: 232 10*3/uL (ref 150–400)
RBC: 4.85 MIL/uL (ref 4.22–5.81)
RDW: 12.7 % (ref 11.5–15.5)
WBC: 11 10*3/uL — ABNORMAL HIGH (ref 4.0–10.5)
nRBC: 0 % (ref 0.0–0.2)

## 2023-02-20 LAB — BASIC METABOLIC PANEL
Anion gap: 9 (ref 5–15)
BUN: 11 mg/dL (ref 6–20)
CO2: 24 mmol/L (ref 22–32)
Calcium: 9.3 mg/dL (ref 8.9–10.3)
Chloride: 104 mmol/L (ref 98–111)
Creatinine, Ser: 1.03 mg/dL (ref 0.61–1.24)
GFR, Estimated: >60 mL/min (ref >60)
Glucose, Bld: 92 mg/dL (ref 70–99)
Potassium: 4 mmol/L (ref 3.5–5.1)
Sodium: 137 mmol/L (ref 135–145)

## 2023-02-20 LAB — HEPATIC FUNCTION PANEL
ALT: 31 U/L (ref 0–44)
AST: 29 U/L (ref 15–41)
Albumin: 4.4 g/dL (ref 3.5–5.0)
Alkaline Phosphatase: 54 U/L (ref 38–126)
Bilirubin, Direct: 0.1 mg/dL (ref 0.0–0.2)
Indirect Bilirubin: 0.8 mg/dL (ref 0.3–0.9)
Total Bilirubin: 0.9 mg/dL (ref 0.3–1.2)
Total Protein: 8 g/dL (ref 6.5–8.1)

## 2023-02-20 LAB — TROPONIN I (HIGH SENSITIVITY)
Troponin I (High Sensitivity): <2 ng/L (ref <18)
Troponin I (High Sensitivity): <2 ng/L (ref <18)

## 2023-02-20 LAB — MAGNESIUM: Magnesium: 2.2 mg/dL (ref 1.7–2.4)

## 2023-02-20 LAB — LIPASE, BLOOD: Lipase: 48 U/L (ref 11–51)

## 2023-02-20 MED ORDER — NITROGLYCERIN 2 % TD OINT
1.0000 [in_us] | TOPICAL_OINTMENT | Freq: Once | TRANSDERMAL | Status: DC
  Filled 2023-02-20: qty 1

## 2023-02-20 MED ORDER — ALUM & MAG HYDROXIDE-SIMETH 200-200-20 MG/5ML PO SUSP
30.0000 mL | Freq: Once | ORAL | Status: AC
  Administered 2023-02-20: 30 mL via ORAL
  Filled 2023-02-20: qty 30

## 2023-02-20 MED ORDER — IOHEXOL 350 MG/ML SOLN
75.0000 mL | Freq: Once | INTRAVENOUS | Status: AC | PRN
  Administered 2023-02-20: 75 mL via INTRAVENOUS

## 2023-02-20 MED ORDER — FAMOTIDINE IN NACL 20-0.9 MG/50ML-% IV SOLN
20.0000 mg | Freq: Once | INTRAVENOUS | Status: AC
  Administered 2023-02-20: 20 mg via INTRAVENOUS
  Filled 2023-02-20: qty 50

## 2023-02-20 MED ORDER — FENTANYL CITRATE PF 50 MCG/ML IJ SOSY
50.0000 ug | PREFILLED_SYRINGE | Freq: Once | INTRAMUSCULAR | Status: AC
  Administered 2023-02-20: 50 ug via INTRAVENOUS
  Filled 2023-02-20: qty 1

## 2023-02-20 MED ORDER — LIDOCAINE VISCOUS HCL 2 % MT SOLN
15.0000 mL | Freq: Once | OROMUCOSAL | Status: AC
  Administered 2023-02-20: 15 mL via ORAL
  Filled 2023-02-20: qty 15

## 2023-02-20 MED ORDER — ASPIRIN 81 MG PO CHEW
324.0000 mg | CHEWABLE_TABLET | Freq: Once | ORAL | Status: AC
  Administered 2023-02-20: 324 mg via ORAL
  Filled 2023-02-20: qty 4

## 2023-02-20 NOTE — ED Triage Notes (Signed)
Pt comes in with chest pain starting suddenly around 1545. Pt states central chest pain that shoots to left arm. Pt did not take any medication to help with pain. Per wife pt took a Protonix at home and bp medication.

## 2023-02-20 NOTE — Discharge Instructions (Signed)
Testing done today in the emergency room is reassuring.  A referral for cardiology was placed.  You should hear from their office to set up an appointment to establish care.  If you do not hear from them, call the number below to set up that appointment.  Return to the emergency department for any new or worsening symptoms of concern.

## 2023-02-20 NOTE — ED Notes (Signed)
Patient transported to X-ray 

## 2023-02-20 NOTE — ED Provider Notes (Signed)
[place] EMERGENCY DEPARTMENT AT [facility] Provider Note   CSN: [id number] Arrival date & time: 02/20/23  1841     History  Chief Complaint  Patient presents with   Chest Pain    Benjamin Carter is a 42 y.o. male.   Chest Pain Associated symptoms: shortness of breath   Patient presents for chest pain.  Medical history includes arthritis, anxiety, prediabetes, HLD.  Onset was 3 hours prior to arrival.  At the time, he was driving home from work.  Pain is substernal and does radiate to left arm.  He has been persistent since onset.  He endorses some associated shortness of breath.  He denies any other symptoms.  He does not see a cardiologist.  He does smoke.  His father had a heart attack at age 23.     Home Medications Prior to Admission medications   Medication Sig Start Date End Date Taking? Authorizing Provider  amoxicillin-clavulanate (AUGMENTIN) 875-125 MG tablet Take 1 tablet by mouth 2 (two) times daily. Patient not taking: Reported on 08/15/2022 01/04/22   Lenn Sink, DPM  cephALEXin (KEFLEX) 500 MG capsule Take 500 mg by mouth 2 (two) times daily. Patient not taking: Reported on 08/15/2022 01/03/22   [provider]  chlorhexidine (HIBICLENS) 4 % external liquid Apply topically daily as needed. Patient not taking: Reported on 08/15/2022 03/16/22   Particia Nearing, PA-C  clindamycin (CLEOCIN) 300 MG capsule Take 1 capsule (300 mg total) by mouth 3 (three) times daily. Patient not taking: Reported on 08/15/2022 03/16/22   Particia Nearing, PA-C  doxycycline (VIBRA-TABS) 100 MG tablet Take 1 tablet (100 mg total) by mouth 2 (two) times daily. Patient not taking: Reported on 08/15/2022 01/25/22   Lenn Sink, DPM  doxycycline (VIBRAMYCIN) 100 MG capsule Take 100 mg by mouth 2 (two) times daily. Patient not taking: Reported on 08/15/2022 01/03/22   [provider]  ibuprofen (ADVIL) 800 MG tablet Take 1 tablet (800 mg total) by mouth  every 8 (eight) hours as needed. 06/02/21   Ocie Doyne, DMD  mupirocin cream (BACTROBAN) 2 % Apply 1 application topically 2 (two) times daily. Patient not taking: Reported on 08/15/2022 05/05/21   Candelaria Stagers, DPM  mupirocin ointment (BACTROBAN) 2 % Apply 1 Application topically 2 (two) times daily. Patient not taking: Reported on 08/15/2022 03/16/22   Particia Nearing, PA-C  ondansetron (ZOFRAN-ODT) 4 MG disintegrating tablet Take 1 tablet (4 mg total) by mouth every 8 (eight) hours as needed for nausea or vomiting. 02/21/22   Valentino Nose, NP  oxyCODONE (OXY IR/ROXICODONE) 5 MG immediate release tablet Take 1 tablet (5 mg total) by mouth every 6 (six) hours as needed. 06/02/21   Ocie Doyne, DMD  oxyCODONE-acetaminophen (PERCOCET) 10-325 MG tablet Take 1 tablet by mouth every 6 (six) hours as needed for pain.    [provider]  predniSONE (DELTASONE) 20 MG tablet Take 2 tablets (40 mg total) by mouth daily. Patient not taking: Reported on 08/15/2022 03/08/22   Mardella Layman, MD  triamcinolone ointment (KENALOG) 0.1 % Apply 1 application. topically 2 (two) times daily. Apply sparingly to affected areas; do not use for more than 2 weeks consecutively Patient not taking: Reported on 08/15/2022 12/25/21   Valentino Nose, NP      Allergies    Bactrim [sulfamethoxazole-trimethoprim], Sulfites, Doxycycline, and Toradol [ketorolac tromethamine]    Review of Systems   Review of Systems  Respiratory:  Positive for  shortness of breath.   Cardiovascular:  Positive for chest pain.  All other systems reviewed and are negative.   Physical Exam Updated Vital Signs BP 111/70   Pulse (!) 53   Temp 98.8 F (37.1 C) (Oral)   Resp 15   Ht 6\' 6"  (1.981 m)   Wt 127 kg   SpO2 99%   BMI 32.36 kg/m  Physical Exam Vitals and nursing note reviewed.  Constitutional:      General: He is not in acute distress.    Appearance: He is well-developed. He is not ill-appearing,  toxic-appearing or diaphoretic.  HENT:     Head: Normocephalic and atraumatic.  Eyes:     Conjunctiva/sclera: Conjunctivae normal.  Cardiovascular:     Rate and Rhythm: Normal rate and regular rhythm.     Heart sounds: No murmur heard. Pulmonary:     Effort: Pulmonary effort is normal. No tachypnea or respiratory distress.     Breath sounds: Normal breath sounds. No decreased breath sounds, wheezing, rhonchi or rales.  Chest:     Chest wall: No tenderness.  Abdominal:     Palpations: Abdomen is soft.     Tenderness: There is no abdominal tenderness.  Musculoskeletal:        General: No swelling. Normal range of motion.     Cervical back: Normal range of motion and neck supple.     Right lower leg: No edema.     Left lower leg: No edema.  Skin:    General: Skin is warm and dry.     Coloration: Skin is not cyanotic or pale.  Neurological:     General: No focal deficit present.     Mental Status: He is alert and oriented to person, place, and time.  Psychiatric:        Mood and Affect: Mood normal.        Behavior: Behavior normal.     ED Results / Procedures / Treatments   Labs (all labs ordered are listed, but only abnormal results are displayed) Labs Reviewed  CBC - Abnormal; Notable for the following components:      Result Value   WBC 11.0 (*)    All other components within normal limits  BASIC METABOLIC PANEL  MAGNESIUM  HEPATIC FUNCTION PANEL  LIPASE, BLOOD  TROPONIN I (HIGH SENSITIVITY)  TROPONIN I (HIGH SENSITIVITY)    EKG EKG Interpretation Date/Time:  Wednesday February 20 2023 19:13:18 EDT Ventricular Rate:  66 PR Interval:  204 QRS Duration:  98 QT Interval:  386 QTC Calculation: 405 R Axis:   30  Text Interpretation: Sinus rhythm Borderline prolonged PR interval Low voltage, precordial leads Confirmed by Gloris Manchester ([id number]) on 02/20/2023 7:18:06 PM  Radiology CT Angio Chest PE W and/or Wo Contrast  Result Date: 02/20/2023 CLINICAL DATA:  Chest pain  EXAM: CT ANGIOGRAPHY CHEST WITH CONTRAST TECHNIQUE: Multidetector CT imaging of the chest was performed using the standard protocol during bolus administration of intravenous contrast. Multiplanar CT image reconstructions and MIPs were obtained to evaluate the vascular anatomy. RADIATION DOSE REDUCTION: This exam was performed according to the departmental dose-optimization program which includes automated exposure control, adjustment of the mA and/or kV according to patient size and/or use of iterative reconstruction technique. CONTRAST:  75mL OMNIPAQUE IOHEXOL 350 MG/ML SOLN COMPARISON:  Chest x-ray 02/20/2023, CT chest 08/29/2020 FINDINGS: Cardiovascular: Satisfactory opacification of the pulmonary arteries to the segmental level. No evidence of pulmonary embolism. Normal heart size. No pericardial effusion. Nonaneurysmal aorta. Mediastinum/Nodes:  No enlarged mediastinal, hilar, or axillary lymph nodes. Thyroid gland, trachea, and esophagus demonstrate no significant findings. Lungs/Pleura: Lungs are clear. No pleural effusion or pneumothorax. Upper Abdomen: Hepatic steatosis. Musculoskeletal: No chest wall abnormality. No acute or significant osseous findings. Review of the MIP images confirms the above findings. IMPRESSION: 1. Negative for acute pulmonary embolus or aortic dissection. 2. Hepatic steatosis. Electronically Signed   By: Jasmine Pang M.D.   On: 02/20/2023 21:28   DG Chest 2 View  Result Date: 02/20/2023 CLINICAL DATA:  Chest pain EXAM: CHEST - 2 VIEW COMPARISON:  08/29/2020 FINDINGS: The heart size and mediastinal contours are within normal limits. Both lungs are clear. The visualized skeletal structures are unremarkable. IMPRESSION: No active cardiopulmonary disease. Electronically Signed   By: Jasmine Pang M.D.   On: 02/20/2023 21:25    Procedures Procedures    Medications Ordered in ED Medications  nitroGLYCERIN (NITROGLYN) 2 % ointment 1 inch (1 inch Topical Not Given 02/20/23  1935)  aspirin chewable tablet 324 mg (324 mg Oral Given 02/20/23 1935)  fentaNYL (SUBLIMAZE) injection 50 mcg (50 mcg Intravenous Given 02/20/23 1947)  alum & mag hydroxide-simeth (MAALOX/MYLANTA) 200-200-20 MG/5ML suspension 30 mL (30 mLs Oral Given 02/20/23 2031)    And  lidocaine (XYLOCAINE) 2 % viscous mouth solution 15 mL (15 mLs Oral Given 02/20/23 2031)  famotidine (PEPCID) IVPB 20 mg premix (0 mg Intravenous Stopped 02/20/23 2107)  iohexol (OMNIPAQUE) 350 MG/ML injection 75 mL (75 mLs Intravenous Contrast Given 02/20/23 2017)    ED Course/ Medical Decision Making/ A&P                                 Medical Decision Making Amount and/or Complexity of Data Reviewed Labs: ordered. Radiology: ordered.  Risk OTC drugs. Prescription drug management.   This patient presents to the ED for concern of chest pain, this involves an extensive number of treatment options, and is a complaint that carries with it a high risk of complications and morbidity.  The differential diagnosis includes ACS, GERD, pericarditis, costochondritis, PE   Co morbidities that complicate the patient evaluation  arthritis, anxiety, prediabetes, HLD   Additional history obtained:  Additional history obtained from patient's wife External records from outside source obtained and reviewed including EMR   Lab Tests:  I Ordered, and personally interpreted labs.  The pertinent results include: Slight leukocytosis, normal hemoglobin, normal electrolytes, normal lipase, normal troponins x 2   Imaging Studies ordered:  I ordered imaging studies including chest x-ray, CT chest I independently visualized and interpreted imaging which showed no acute findings I agree with the radiologist interpretation   Cardiac Monitoring: / EKG:  The patient was maintained on a cardiac monitor.  I personally viewed and interpreted the cardiac monitored which showed an underlying rhythm of: Sinus rhythm  Problem List / ED  Course / Critical interventions / Medication management  Patient presenting for acute onset of chest pain.  On arrival, vital signs are normal.  Patient appears uncomfortable.  He describes a substernal chest pain with radiation to left arm.  Current breathing is unlabored.  No cardiac rubs or murmurs are appreciated on auscultation.  EKG does not show evidence of STEMI.  324 ASA, NTG ointment, and fentanyl were ordered.  Lab work was initiated.  Patient's initial lab work, including troponin are reassuring.  GI cocktail was ordered for empiric treatment of reflux etiology.  Patient went a CTA  which did not show any acute findings.  Repeat troponin was also negative.  Patient did have improved symptoms while in the ED.  He does feel comfortable with discharge home.  Given his family history, patient benefit from establishing care with cardiology.  He is agreeable to this.  Cardiology referral was ordered.  Patient was discharged in stable condition. I ordered medication including ASA, fentanyl, GI cocktail for chest pain Reevaluation of the patient after these medicines showed that the patient improved I have reviewed the patients home medicines and have made adjustments as needed   Social Determinants of Health:  Has PCP        Final Clinical Impression(s) / ED Diagnoses Final diagnoses:  Chest pain, unspecified type    Rx / DC Orders ED Discharge Orders          Ordered    Ambulatory referral to Cardiology       Comments: If you have not heard from the Cardiology office within the next 72 hours please call [phone].   02/20/23 7846              Gloris Manchester, MD 02/20/23 2340

## 2023-02-21 ENCOUNTER — Encounter: Payer: Self-pay | Admitting: Family Medicine

## 2023-02-21 ENCOUNTER — Telehealth: Payer: Self-pay

## 2023-02-21 ENCOUNTER — Ambulatory Visit (INDEPENDENT_AMBULATORY_CARE_PROVIDER_SITE_OTHER): Payer: Medicare HMO | Admitting: Family Medicine

## 2023-02-21 VITALS — BP 119/67 | HR 55 | Temp 98.5°F | Ht 78.0 in | Wt 325.0 lb

## 2023-02-21 DIAGNOSIS — R001 Bradycardia, unspecified: Secondary | ICD-10-CM | POA: Diagnosis not present

## 2023-02-21 DIAGNOSIS — R0789 Other chest pain: Secondary | ICD-10-CM | POA: Diagnosis not present

## 2023-02-21 DIAGNOSIS — F1220 Cannabis dependence, uncomplicated: Secondary | ICD-10-CM

## 2023-02-21 MED ORDER — METHYLPREDNISOLONE ACETATE 80 MG/ML IJ SUSP
80.0000 mg | Freq: Once | INTRAMUSCULAR | Status: AC
Start: 2023-02-21 — End: 2023-02-21
  Administered 2023-02-21: 80 mg via INTRAMUSCULAR

## 2023-02-21 MED ORDER — PREDNISONE 20 MG PO TABS
20.0000 mg | ORAL_TABLET | Freq: Every day | ORAL | 0 refills | Status: AC
Start: 2023-02-21 — End: 2023-02-26

## 2023-02-21 MED ORDER — PANTOPRAZOLE SODIUM 40 MG PO TBEC
40.0000 mg | DELAYED_RELEASE_TABLET | Freq: Every day | ORAL | 3 refills | Status: AC
Start: 2023-02-21 — End: ?

## 2023-02-21 NOTE — Transitions of Care (Post Inpatient/ED Visit) (Signed)
02/21/2023  Name: Benjamin Carter MRN: [id number] DOB: 14-Jan-1981  Today's TOC FU Call Status: Today's TOC FU Call Status:: Successful TOC FU Call Completed TOC FU Call Complete Date: 02/21/23  Transition Care Management Follow-up Telephone Call Date of Discharge: 02/20/23 [facility] Type of Discharge: Emergency Department Reason for ED Visit: Cardiac Conditions Cardiac Conditions Diagnosis: Chest Pain Persisting How have you been since you were released from the hospital?: Same Any questions or concerns?: No  Items Reviewed: Did you receive and understand the discharge instructions provided?: No Medications obtained,verified, and reconciled?: Yes (Medications Reviewed) Any new allergies since your discharge?: No Dietary orders reviewed?: Yes Do you have support at home?: Yes People in Home: spouse  Medications Reviewed Today: Medications Reviewed Today     Reviewed by Karena Addison, LPN (Licensed Practical Nurse) on 02/21/23 at [phone]  Med List Status: <None>   Medication Order Taking? Sig Documenting Provider Last Dose Status Informant  amoxicillin-clavulanate (AUGMENTIN) 875-125 MG tablet [id number] No Take 1 tablet by mouth 2 (two) times daily.  Patient not taking: Reported on 08/15/2022   Lenn Sink, DPM Not Taking Active   cephALEXin (KEFLEX) 500 MG capsule [id number] No Take 500 mg by mouth 2 (two) times daily.  Patient not taking: Reported on 08/15/2022   [provider] Not Taking Active   chlorhexidine (HIBICLENS) 4 % external liquid [id number] No Apply topically daily as needed.  Patient not taking: Reported on 08/15/2022   Particia Nearing, [place] Not Taking Active   clindamycin (CLEOCIN) 300 MG capsule [id number] No Take 1 capsule (300 mg total) by mouth 3 (three) times daily.  Patient not taking: Reported on 08/15/2022   Particia Nearing, PA-C Not Taking Active   doxycycline (VIBRA-TABS) 100 MG tablet [id number] No  Take 1 tablet (100 mg total) by mouth 2 (two) times daily.  Patient not taking: Reported on 08/15/2022   Lenn Sink, DPM Not Taking Active   doxycycline (VIBRAMYCIN) 100 MG capsule [id number] No Take 100 mg by mouth 2 (two) times daily.  Patient not taking: Reported on 08/15/2022   [provider] Not Taking Active   ibuprofen (ADVIL) 800 MG tablet [id number] No Take 1 tablet (800 mg total) by mouth every 8 (eight) hours as needed.  Patient not taking: Reported on 02/21/2023   Ocie Doyne, DMD Not Taking Active   mupirocin cream (BACTROBAN) 2 % [id number] No Apply 1 application topically 2 (two) times daily.  Patient not taking: Reported on 08/15/2022   Candelaria Stagers, DPM Not Taking Active Self  mupirocin ointment (BACTROBAN) 2 % [id number]  Apply 1 Application topically 2 (two) times daily.  Patient not taking: Reported on 08/15/2022   Particia Nearing, PA-C  Active   ondansetron (ZOFRAN-ODT) 4 MG disintegrating tablet [id number] No Take 1 tablet (4 mg total) by mouth every 8 (eight) hours as needed for nausea or vomiting.  Patient not taking: Reported on 02/21/2023   Valentino Nose, NP Not Taking Active   oxyCODONE (OXY IR/ROXICODONE) 5 MG immediate release tablet [id number] No Take 1 tablet (5 mg total) by mouth every 6 (six) hours as needed.  Patient not taking: Reported on 02/21/2023   Ocie Doyne, DMD Not Taking Active   oxyCODONE-acetaminophen (PERCOCET) 10-325 MG tablet [id number] No Take 1 tablet by mouth every 6 (six) hours as needed for pain.  Patient not taking: Reported on 02/21/2023   [provider] Not Taking Active Self  predniSONE (DELTASONE) 20  MG tablet [id number] No Take 2 tablets (40 mg total) by mouth daily.  Patient not taking: Reported on 08/15/2022   Mardella Layman, MD Not Taking Active   triamcinolone ointment (KENALOG) 0.1 % [id number] No Apply 1 application. topically 2 (two) times daily. Apply sparingly to affected areas; do not use for more  than 2 weeks consecutively  Patient not taking: Reported on 08/15/2022   Valentino Nose, NP Not Taking Active             Home Care and Equipment/Supplies: Were Home Health Services Ordered?: NA Any new equipment or medical supplies ordered?: NA  Functional Questionnaire: Do you need assistance with bathing/showering or dressing?: No Do you need assistance with meal preparation?: No Do you need assistance with eating?: No Do you have difficulty maintaining continence: No Do you need assistance with getting out of bed/getting out of a chair/moving?: No Do you have difficulty managing or taking your medications?: No  Follow up appointments reviewed: PCP Follow-up appointment confirmed?: No (patient wants to be seen today. no avail appt.) MD Provider Line Number:[phone] Given: No Specialist Hospital Follow-up appointment confirmed?: Yes Date of Specialist follow-up appointment?: 04/23/23 Follow-Up Specialty Provider:: cardio Do you need transportation to your follow-up appointment?: No Do you understand care options if your condition(s) worsen?: Yes-patient verbalized understanding    SIGNATURE  Karena Addison, LPN [facility] Nurse Health Advisor Direct Dial [phone]

## 2023-02-21 NOTE — Progress Notes (Signed)
Acute Office Visit  Subjective:  Patient ID: Benjamin Carter, male    DOB: [birth year], 42 y.o.   MRN: [id number]  Chief Complaint  Patient presents with   Follow-up    ED follow up chest pain   HPI Patient is in today for continued chest pain. Patient presented to ED last night (02/20/23) for chest pain. Negative Cardiac workup with negative troponin, normal EKG with SB, normal chest CT. Patient reports that fentanyl helped his pain. GI cocktail did not help to relieve his symptoms. Of note, Family history of father having an MI at 63 years old.  Chest pain started at 3:45 02/20/23 on his way home from work. Reports that he continues with chest pain. States that he was in pain upon awakening this morning. Reports that he attempted to go to work this morning and his boss sent him home. 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 Reports that he was not able to drive himself home due to the pain and his father in law had to come get him. Explains that pain worsened with driving. States that it is hard for him to use his right hand while driiving because his chest is tightening up while performing that motion. Endorses worsening pain with deep breath in.  Holding chest when he breathes out helps his pain. States that he is taking oxy and it is not helping.  States that he is currently disoriented due to taking oxy and using marijuana. Reports that he uses medical marijuana, but does not have a card for it.  Denies any recent new physical activity.   ROS As per HPI  Objective:  BP 119/67   Pulse (!) 55   Temp 98.5 F (36.9 C)   Ht 6\' 6"  (1.981 m)   Wt (!) 325 lb (147.4 kg)   SpO2 97%   BMI 37.56 kg/m   Physical Exam Constitutional:      General: He is not in acute distress.    Appearance: He is obese. He is ill-appearing. He is not toxic-appearing or diaphoretic.     Interventions: He is not intubated. Cardiovascular:     Rate and Rhythm: Regular rhythm. Bradycardia present.     Pulses:           Radial pulses are 2+ on the right side and 2+ on the left side.       Dorsalis pedis pulses are 2+ on the right side and 2+ on the left side.     Heart sounds: Normal heart sounds.  Pulmonary:     Effort: Pulmonary effort is normal. No tachypnea, bradypnea, accessory muscle usage, prolonged expiration, respiratory distress or retractions. He is not intubated.     Breath sounds: Normal breath sounds. No stridor, decreased air movement or transmitted upper airway sounds. No decreased breath sounds, wheezing, rhonchi or rales.     Comments: Deep breathing during episodes of pain  Chest:     Chest wall: Tenderness present. No mass, lacerations, deformity, swelling, crepitus or edema.     Comments: Pain on palpation across upper chest. Significant over right breast  Abdominal:     General: Bowel sounds are normal.  Musculoskeletal:     Cervical back: Full passive range of motion without pain.     Right lower leg: No edema.     Left lower leg: No edema.  Skin:    General: Skin is warm.     Capillary Refill: Capillary refill takes less than 2 seconds.  Neurological:  Mental Status: He is oriented to person, place, and time and easily aroused. He is lethargic.  Psychiatric:        Attention and Perception: He is inattentive. He does not perceive auditory hallucinations.        Mood and Affect: Mood is anxious.        Speech: Speech is delayed.        Behavior: Behavior is slowed. Behavior is cooperative.        Thought Content: Thought content does not include homicidal or suicidal ideation. Thought content does not include homicidal or suicidal plan.        Cognition and Memory: Cognition is not impaired. Memory is not impaired. He does not exhibit impaired recent memory or impaired remote memory.        Judgment: Judgment is inappropriate.       02/21/2023   12:01 PM 08/15/2022    8:24 AM 06/21/2021    8:32 AM  Depression screen PHQ 2/9  Decreased Interest 0 0 0  Down,  Depressed, Hopeless 0 0 0  PHQ - 2 Score 0 0 0  Altered sleeping 0    Tired, decreased energy 0    Change in appetite 0    Feeling bad or failure about yourself  0    Trouble concentrating 0    Moving slowly or fidgety/restless 0    Suicidal thoughts 0    PHQ-9 Score 0    Difficult doing work/chores Not difficult at all        02/21/2023   12:01 PM 04/11/2021   10:03 AM 04/04/2021   10:05 AM  GAD 7 : Generalized Anxiety Score  Nervous, Anxious, on Edge 1 0 0  Control/stop worrying 0 0 0  Worry too much - different things 0 0 0  Trouble relaxing 1 0 0  Restless 0 0 0  Easily annoyed or irritable 2 0 0  Afraid - awful might happen 0 0 0  Total GAD 7 Score 4 0 0  Anxiety Difficulty Not difficult at all     Assessment & Plan:  1. Other chest pain Will treat as below with steroid injection and burst for presumed costrochondritis/musculoskeletal pain as pain is reproducible and worsened on palpation. Will trial PPI as below. Patient has follow up with Cardiology in October. Reviewed notes from ED.  BMP, Troponin, Mg, Lipase, Hepatic function panel are all within normal range.  Reviewed EKG Text Interpretation: Sinus rhythm Borderline prolonged PR interval Low voltage, precordial leads Confirmed by Gloris Manchester [phone]) on 02/20/2023 7:18:06 PM  Reviewed CT results  IMPRESSION: 1. Negative for acute pulmonary embolus or aortic dissection. 2. Hepatic steatosis.     Electronically Signed   By: Jasmine Pang M.D.   On: 02/20/2023 21:28 Reviewed DG Chest 2 View  IMPRESSION: No active cardiopulmonary disease.     Electronically Signed   By: Jasmine Pang M.D.   On: 02/20/2023 21:25 - pantoprazole (PROTONIX) 40 MG tablet; Take 1 tablet (40 mg total) by mouth daily.  Dispense: 30 tablet; Refill: 3 - predniSONE (DELTASONE) 20 MG tablet; Take 1 tablet (20 mg total) by mouth daily with breakfast for 5 days.  Dispense: 5 tablet; Refill: 0 - methylPREDNISolone acetate (DEPO-MEDROL) injection  80 mg  2. Cannabis use disorder, moderate, dependence (HCC) Discussed with patient and reviewed with lead physician, Dettinger, MD., that it would be unsafe to prescribe muscle relaxer given cannabis use and opioid use.   3. Bradycardia HR consistent with  prior. Reviewed EKG as above. Considered CCM for possible coronary spasm. However, given HR, did not prescribe CCM at this time. Recommend patient follow up with Cardiology.    The above assessment and management plan was discussed with the patient. The patient verbalized understanding of and has agreed to the management plan using shared-decision making. Patient is aware to call the clinic if they develop any new symptoms or if symptoms fail to improve or worsen. Patient is aware when to return to the clinic for a follow-up visit. Patient educated on when it is appropriate to go to the emergency department.   Return if symptoms worsen or fail to improve.  Neale Burly, DNP-FNP [facility] [address] [place], [place] [address] [phone]

## 2023-04-23 ENCOUNTER — Ambulatory Visit: Payer: Medicare HMO | Admitting: Cardiology

## 2023-05-17 ENCOUNTER — Ambulatory Visit: Payer: Medicare HMO | Attending: Cardiology | Admitting: Internal Medicine

## 2023-05-17 NOTE — Progress Notes (Signed)
Erroneous encounter - please disregard.

## 2023-08-23 ENCOUNTER — Ambulatory Visit (INDEPENDENT_AMBULATORY_CARE_PROVIDER_SITE_OTHER): Payer: Medicare HMO

## 2023-08-23 VITALS — Ht 78.0 in | Wt 325.0 lb

## 2023-08-23 DIAGNOSIS — Z0000 Encounter for general adult medical examination without abnormal findings: Secondary | ICD-10-CM | POA: Diagnosis not present

## 2023-08-23 NOTE — Patient Instructions (Signed)
[name] , Thank you for taking time to come for your Medicare Wellness Visit. I appreciate your ongoing commitment to your health goals. Please review the following plan we discussed and let me know if I can assist you in the future.   Referrals/Orders/Follow-Ups/Clinician Recommendations: Aim for 30 minutes of exercise or brisk walking, 6-8 glasses of water, and 5 servings of fruits and vegetables each day.  This is a list of the screening recommended for you and due dates:  Health Maintenance  Topic Date Due   Pneumococcal Vaccination (1 of 2 - PCV) Never done   HIV Screening  Never done   Hepatitis C Screening  Never done   Flu Shot  Never done   COVID-19 Vaccine (1 - 2024-25 season) Never done   Medicare Annual Wellness Visit  08/22/2024   DTaP/Tdap/Td vaccine (2 - Td or Tdap) 04/30/2031   HPV Vaccine  Aged Out    Advanced directives: (ACP Link)Information on Advanced Care Planning can be found at [facility] Advance Health Care Directives Advance Health Care Directives ([email or web address]   Next Medicare Annual Wellness Visit scheduled for next year: Yes

## 2023-08-23 NOTE — Progress Notes (Signed)
Subjective:   Benjamin Carter is a 43 y.o. male who presents for Medicare Annual/Subsequent preventive examination.  Visit Complete: Virtual I connected with  Benjamin Carter on 08/23/23 by a audio enabled telemedicine application and verified that I am speaking with the correct person using two identifiers.  Patient Location: Home  Provider Location: Home Office  This patient declined Interactive audio and video telecommunications. Therefore the visit was completed with audio only.  I discussed the limitations of evaluation and management by telemedicine. The patient expressed understanding and agreed to proceed.  Vital Signs: Because this visit was a virtual/telehealth visit, some criteria may be missing or patient reported. Any vitals not documented were not able to be obtained and vitals that have been documented are patient reported.  Cardiac Risk Factors include: male gender;smoking/ tobacco exposure     Objective:    Today's Vitals   08/23/23 1111  Weight: (!) 325 lb (147.4 kg)  Height: 6\' 6"  (1.981 m)   Body mass index is 37.56 kg/m.     08/23/2023    1:40 PM 02/20/2023    6:53 PM 08/15/2022    8:25 AM 06/21/2021    8:37 AM 06/02/2021    6:26 AM 05/31/2021    5:55 PM 08/29/2020    8:07 AM  Advanced Directives  Does Patient Have a Medical Advance Directive? No No No No No No No  Would patient like information on creating a medical advance directive? Yes (MAU/Ambulatory/Procedural Areas - Information given) No - Patient declined No - Patient declined No - Patient declined No - Patient declined No - Patient declined     Current Medications (verified) Outpatient Encounter Medications as of 08/23/2023  Medication Sig   oxyCODONE-acetaminophen (PERCOCET) 10-325 MG tablet Take 1 tablet by mouth every 6 (six) hours as needed for pain.   clindamycin (CLEOCIN) 300 MG capsule Take 1 capsule (300 mg total) by mouth 3 (three) times daily. (Patient not taking: Reported on  08/23/2023)   mupirocin cream (BACTROBAN) 2 % Apply 1 application topically 2 (two) times daily. (Patient not taking: Reported on 08/23/2023)   mupirocin ointment (BACTROBAN) 2 % Apply 1 Application topically 2 (two) times daily. (Patient not taking: Reported on 08/23/2023)   ondansetron (ZOFRAN-ODT) 4 MG disintegrating tablet Take 1 tablet (4 mg total) by mouth every 8 (eight) hours as needed for nausea or vomiting. (Patient not taking: Reported on 08/23/2023)   pantoprazole (PROTONIX) 40 MG tablet Take 1 tablet (40 mg total) by mouth daily. (Patient not taking: Reported on 08/23/2023)   No facility-administered encounter medications on file as of 08/23/2023.    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)    Pre-diabetes    Wears glasses    Past Surgical History:  Procedure Laterality Date   AMPUTATION Left 03/10/2014   Procedure: Irrigation and Debridement and Revision of Amputation Left Index Finger;  Surgeon: Sharma Covert, MD;  Location: [facility] OR;  Service: Orthopedics;  Laterality: Left;  Wants to go at 5PM   AMPUTATION Left 10/21/2014   Procedure: LEFT LONG FINGER  AMPUTATION REVISION;  Surgeon: Bradly Bienenstock, MD;  Location: [facility] ORS;  Service: Orthopedics;  Laterality: Left;   CHOLECYSTECTOMY N/A 12/07/2013   Procedure: LAPAROSCOPIC CHOLECYSTECTOMY;  Surgeon: Dalia Heading, MD;  Location: [facility];  Service: General;  Laterality: N/A;   GALLBLADDER SURGERY  2015   I & D EXTREMITY Left 03/07/2014   Procedure: IRRIGATION AND DEBRIDEMENT INDEX FINGER;  Surgeon: Sharma Covert, MD;  Location: [facility] OR;  Service:  Orthopedics;  Laterality: Left;   LUMBAR DISC SURGERY  2004   TOOTH EXTRACTION N/A 06/02/2021   Procedure: DENTAL RESTORATION/EXTRACTIONS;  Surgeon: Ocie Doyne, DMD;  Location: [facility] OR;  Service: Oral Surgery;  Laterality: N/A;   Family History  Problem Relation Age of Onset   Diabetes Mother    Hyperlipidemia Mother    Hypertension Mother    Heart disease Mother    Kidney failure Mother    Neuropathy Mother    Bipolar disorder Mother    Heart attack Father        21, 80 first MI   Cancer Father        colon upper 40's   Bipolar disorder Father    Stroke Maternal Grandmother    Cirrhosis Maternal Grandfather    Mental illness Cousin    Suicidality Cousin    Social History   Socioeconomic History   Marital status: Single    Spouse name: Not on file   Number of children: 1   Years of education: Not on file   Highest education level: High school graduate  Occupational History   Not on file  Tobacco Use   Smoking status: Every Day    Current packs/day: 0.50    Average packs/day: 0.5 packs/day for 20.0 years (10.0 ttl pk-yrs)    Types: Cigarettes   Smokeless tobacco: Never  Vaping Use   Vaping status: Never Used  Substance and Sexual Activity   Alcohol use: Yes    Comment: social infrequently   Drug use: Yes    Frequency: 4.0 times per week    Types: Marijuana   Sexual activity: Yes    Birth control/protection: Surgical    Comment: girlfriend had surgery, 39year old girl  Other Topics Concern   Not on file  Social History Narrative   Lives with girlfriend, Victorino Dike and her daughter.    Social Drivers of Corporate investment banker Strain: Low Risk  (08/23/2023)   Overall Financial Resource Strain (CARDIA)    Difficulty of Paying Living Expenses: Not hard at all  Food Insecurity: No Food Insecurity (08/23/2023)   Hunger Vital Sign    Worried About Running Out of Food in the Last Year: Never true    Ran Out of Food in the Last Year: Never true  Transportation  Needs: No Transportation Needs (08/23/2023)   PRAPARE - Administrator, Civil Service (Medical): No    Lack of Transportation (Non-Medical): No  Physical Activity: Insufficiently Active (08/23/2023)   Exercise Vital Sign    Days of Exercise per Week: 3 days    Minutes of Exercise per Session: 30 min  Stress: No Stress Concern Present (08/23/2023)   [employer] of Occupational Health - Occupational Stress Questionnaire    Feeling of Stress : Not at all  Social Connections: Moderately Isolated (08/23/2023)   Social Connection and Isolation Panel [NHANES]    Frequency of Communication with Friends and Family: More than three times a week    Frequency of Social Gatherings with Friends  and Family: Three times a week    Attends Religious Services: Never    Active Member of Clubs or Organizations: No    Attends Engineer, structural: Never    Marital Status: Living with partner    Tobacco Counseling Ready to quit: Not Answered Counseling given: Not Answered   Clinical Intake:  Pre-visit preparation completed: Yes  Pain : No/denies pain     Diabetes: No  How often do you need to have someone help you when you read instructions, pamphlets, or other written materials from your doctor or pharmacy?: 1 - Never  Interpreter Needed?: No  Information entered by :: Kandis Fantasia LPN   Activities of Daily Living    08/23/2023   11:11 AM  In your present state of health, do you have any difficulty performing the following activities:  Hearing? 0  Vision? 0  Difficulty concentrating or making decisions? 0  Walking or climbing stairs? 0  Dressing or bathing? 0  Doing errands, shopping? 0  Preparing Food and eating ? N  Using the Toilet? N  In the past six months, have you accidently leaked urine? N  Do you have problems with loss of bowel control? N  Managing your Medications? N  Managing your Finances? N  Housekeeping or managing your Housekeeping? N     Patient Care Team: Sonny Masters, FNP as PCP - General (Family Medicine)  Indicate any recent Medical Services you may have received from other than [facility] providers in the past year (date may be approximate).     Assessment:   This is a routine wellness examination for [facility].  Hearing/Vision screen Hearing Screening - Comments:: Denies hearing difficulties   Vision Screening - Comments:: No vision problems; will schedule routine eye exam soon     Goals Addressed             This Visit's Progress    COMPLETED: Medicare Wellness       02/05/2019 AWV Goal: Tobacco Cessation  Smoking cessation instruction/counseling given:  counseled patient on the dangers of tobacco use, advised patient to stop smoking, and reviewed strategies to maximize success  Patient will verbalize understanding of the health risks associated with smoking/tobacco use Lung cancer or lung disease, such as COPD Heart disease. Stroke. Heart attack Infertility Osteoporosis and bone fractures. Patient will create a plan to quit smoking/using tobacco Pick a date to quit.  Write down the reasons why you are quitting and put it where you will see it often. Identify the people, places, things, and activities that make you want to smoke (triggers) and avoid them. Make sure to take these actions: Throw away all cigarettes at home, at work, and in your car. Throw away smoking accessories, such as Set designer. Clean your car and make sure to empty the ashtray. Clean your home, including curtains and carpets. Tell your family, friends, and coworkers that you are quitting. Support from your loved ones can make quitting easier. Talk with your health care provider about your options for quitting smoking. Find out what treatment options are covered by your health insurance. Patient will be able to demonstrate knowledge of tobacco cessation strategies that may maximize success Quitting "cold [place]" is  more successful than gradually quitting. Attending in-person counseling to help you build problem-solving skills.  Finding resources and support systems that can help you to quit smoking and remain smoke-free after you quit. These resources are most helpful when you use them often. They can include:  Online chats with a Veterinary surgeon. Telephone quitlines. Printed Materials engineer. Support groups or group counseling. Text messaging programs. Mobile phone applications. Taking medicines to help you quit smoking: Nicotine patches, gum, or lozenges. Nicotine inhalers or sprays. Non-nicotine medicine that is taken by mouth. Patient will note get discouraged if the process is difficult Over the next year, patient will stop smoking or using other forms of tobacco  Smoking cessation instruction/counseling given:  counseled patient on the dangers of tobacco use, advised patient to stop smoking, and reviewed strategies to maximize success        Depression Screen    08/23/2023    1:40 PM 02/21/2023   12:01 PM 08/15/2022    8:24 AM 06/21/2021    8:32 AM 04/11/2021   10:07 AM 04/11/2021   10:03 AM 04/04/2021   10:05 AM  PHQ 2/9 Scores  PHQ - 2 Score 0 0 0 0 0 0 0  PHQ- 9 Score  0   0 0 0    Fall Risk    08/23/2023    1:41 PM 02/21/2023   12:01 PM 08/15/2022    8:21 AM 06/21/2021    8:38 AM 04/11/2021   10:02 AM  Fall Risk   Falls in the past year? 0 0 0 0 0  Number falls in past yr: 0 0 0 0   Injury with Fall? 0 0 0 0   Risk for fall due to : No Fall Risks No Fall Risks No Fall Risks Impaired mobility   Follow up Falls prevention discussed;Education provided;Falls evaluation completed Falls evaluation completed Falls prevention discussed Falls prevention discussed     MEDICARE RISK AT HOME: Medicare Risk at Home Any stairs in or around the home?: No If so, are there any without handrails?: No Home free of loose throw rugs in walkways, pet beds, electrical cords, etc?: Yes Adequate  lighting in your home to reduce risk of falls?: Yes Life alert?: No Use of a cane, walker or w/c?: No Grab bars in the bathroom?: Yes Shower chair or bench in shower?: No Elevated toilet seat or a handicapped toilet?: Yes  TIMED UP AND GO:  Was the test performed?  No    Cognitive Function:        08/23/2023    1:41 PM 08/15/2022    8:26 AM 06/21/2021    8:40 AM 02/05/2019   10:03 AM  6CIT Screen  What Year? 0 points 0 points 0 points 0 points  What month? 0 points 0 points 0 points 0 points  What time? 0 points 0 points 0 points 0 points  Count back from 20 0 points 0 points 0 points 0 points  Months in reverse 0 points 0 points 0 points 0 points  Repeat phrase 0 points 0 points 0 points 0 points  Total Score 0 points 0 points 0 points 0 points    Immunizations Immunization History  Administered Date(s) Administered   Tdap 04/29/2021    TDAP status: Up to date  Flu Vaccine status: Declined, Education has been provided regarding the importance of this vaccine but patient still declined. Advised may receive this vaccine at local pharmacy or Health Dept. Aware to provide a copy of the vaccination record if obtained from local pharmacy or Health Dept. Verbalized acceptance and understanding.  Pneumococcal vaccine status: Declined,  Education has been provided regarding the importance of this vaccine but patient still declined. Advised may receive this vaccine at local pharmacy or Health Dept. Aware to  provide a copy of the vaccination record if obtained from local pharmacy or Health Dept. Verbalized acceptance and understanding.   Covid-19 vaccine status: Declined, Education has been provided regarding the importance of this vaccine but patient still declined. Advised may receive this vaccine at local pharmacy or Health Dept.or vaccine clinic. Aware to provide a copy of the vaccination record if obtained from local pharmacy or Health Dept. Verbalized acceptance and  understanding.  Qualifies for Shingles Vaccine? No    Screening Tests Health Maintenance  Topic Date Due   Pneumococcal Vaccine 65-69 Years old (1 of 2 - PCV) Never done   HIV Screening  Never done   Hepatitis C Screening  Never done   INFLUENZA VACCINE  Never done   COVID-19 Vaccine (1 - 2024-25 season) Never done   Medicare Annual Wellness (AWV)  08/22/2024   DTaP/Tdap/Td (2 - Td or Tdap) 04/30/2031   HPV VACCINES  Aged Out    Health Maintenance  Health Maintenance Due  Topic Date Due   Pneumococcal Vaccine 40-15 Years old (1 of 2 - PCV) Never done   HIV Screening  Never done   Hepatitis C Screening  Never done   INFLUENZA VACCINE  Never done   COVID-19 Vaccine (1 - 2024-25 season) Never done    Lung Cancer Screening: (Low Dose CT Chest recommended if Age 49-80 years, 20 pack-year currently smoking OR have quit w/in 15years.) does not qualify.   Lung Cancer Screening Referral: n/a  Additional Screening:  Hepatitis C Screening: does qualify  Vision Screening: Recommended annual ophthalmology exams for early detection of glaucoma and other disorders of the eye. Is the patient up to date with their annual eye exam?  No  Who is the provider or what is the name of the office in which the patient attends annual eye exams? none If pt is not established with a provider, would they like to be referred to a provider to establish care? No .   Dental Screening: Recommended annual dental exams for proper oral hygiene  Community Resource Referral / Chronic Care Management: CRR required this visit?  No   CCM required this visit?  No     Plan:     I have personally reviewed and noted the following in the patient's chart:   Medical and social history Use of alcohol, tobacco or illicit drugs  Current medications and supplements including opioid prescriptions. Patient is currently taking opioid prescriptions. Information provided to patient regarding non-opioid alternatives.  Patient advised to discuss non-opioid treatment plan with their provider. Functional ability and status Nutritional status Physical activity Advanced directives List of other physicians Hospitalizations, surgeries, and ER visits in previous 12 months Vitals Screenings to include cognitive, depression, and falls Referrals and appointments  In addition, I have reviewed and discussed with patient certain preventive protocols, quality metrics, and best practice recommendations. A written personalized care plan for preventive services as well as general preventive health recommendations were provided to patient.     Kandis Fantasia [place], [place]   1/61/0960   After Visit Summary: (MyChart) Due to this being a telephonic visit, the after visit summary with patients personalized plan was offered to patient via MyChart   Nurse Notes: No concerns at this time

## 2024-01-31 ENCOUNTER — Ambulatory Visit: Admitting: Nurse Practitioner

## 2024-02-03 ENCOUNTER — Ambulatory Visit: Admitting: Family Medicine

## 2024-02-04 ENCOUNTER — Encounter: Payer: Self-pay | Admitting: Family Medicine

## 2024-05-05 DIAGNOSIS — M25561 Pain in right knee: Secondary | ICD-10-CM | POA: Diagnosis not present

## 2024-05-08 DIAGNOSIS — M25561 Pain in right knee: Secondary | ICD-10-CM | POA: Diagnosis not present

## 2024-08-25 ENCOUNTER — Ambulatory Visit: Payer: Medicare HMO
